# Patient Record
Sex: Female | Born: 1945 | Race: White | Hispanic: No | Marital: Married | Smoking: Former smoker
Health system: Southern US, Community
[De-identification: ages and names within clinical notes are randomized; demographics above are authoritative.]

## PROBLEM LIST (undated history)

## (undated) DIAGNOSIS — E119 Type 2 diabetes mellitus without complications: Secondary | ICD-10-CM

## (undated) DIAGNOSIS — K449 Diaphragmatic hernia without obstruction or gangrene: Secondary | ICD-10-CM

## (undated) DIAGNOSIS — F32A Depression, unspecified: Secondary | ICD-10-CM

## (undated) DIAGNOSIS — E785 Hyperlipidemia, unspecified: Secondary | ICD-10-CM

## (undated) DIAGNOSIS — H269 Unspecified cataract: Secondary | ICD-10-CM

## (undated) DIAGNOSIS — F419 Anxiety disorder, unspecified: Secondary | ICD-10-CM

## (undated) DIAGNOSIS — K648 Other hemorrhoids: Secondary | ICD-10-CM

## (undated) DIAGNOSIS — T7840XA Allergy, unspecified, initial encounter: Secondary | ICD-10-CM

## (undated) DIAGNOSIS — N189 Chronic kidney disease, unspecified: Secondary | ICD-10-CM

## (undated) DIAGNOSIS — R112 Nausea with vomiting, unspecified: Secondary | ICD-10-CM

## (undated) DIAGNOSIS — Z8489 Family history of other specified conditions: Secondary | ICD-10-CM

## (undated) DIAGNOSIS — I10 Essential (primary) hypertension: Secondary | ICD-10-CM

## (undated) DIAGNOSIS — M8580 Other specified disorders of bone density and structure, unspecified site: Secondary | ICD-10-CM

## (undated) DIAGNOSIS — E559 Vitamin D deficiency, unspecified: Secondary | ICD-10-CM

## (undated) DIAGNOSIS — K219 Gastro-esophageal reflux disease without esophagitis: Secondary | ICD-10-CM

## (undated) DIAGNOSIS — Z9889 Other specified postprocedural states: Secondary | ICD-10-CM

## (undated) DIAGNOSIS — F329 Major depressive disorder, single episode, unspecified: Secondary | ICD-10-CM

## (undated) HISTORY — DX: Diaphragmatic hernia without obstruction or gangrene: K44.9

## (undated) HISTORY — DX: Unspecified cataract: H26.9

## (undated) HISTORY — DX: Hyperlipidemia, unspecified: E78.5

## (undated) HISTORY — DX: Type 2 diabetes mellitus without complications: E11.9

## (undated) HISTORY — DX: Chronic kidney disease, unspecified: N18.9

## (undated) HISTORY — DX: Major depressive disorder, single episode, unspecified: F32.9

## (undated) HISTORY — DX: Allergy, unspecified, initial encounter: T78.40XA

## (undated) HISTORY — DX: Other hemorrhoids: K64.8

## (undated) HISTORY — PX: LUMBAR DISC SURGERY: SHX700

## (undated) HISTORY — PX: OTHER SURGICAL HISTORY: SHX169

## (undated) HISTORY — DX: Gastro-esophageal reflux disease without esophagitis: K21.9

## (undated) HISTORY — PX: ABDOMINAL HYSTERECTOMY: SHX81

## (undated) HISTORY — DX: Depression, unspecified: F32.A

## (undated) HISTORY — DX: Other specified disorders of bone density and structure, unspecified site: M85.80

## (undated) HISTORY — DX: Vitamin D deficiency, unspecified: E55.9

---

## 1997-12-26 ENCOUNTER — Ambulatory Visit (HOSPITAL_COMMUNITY): Admission: RE | Admit: 1997-12-26 | Discharge: 1997-12-26 | Payer: Self-pay | Admitting: Family Medicine

## 1997-12-26 ENCOUNTER — Encounter: Payer: Self-pay | Admitting: Family Medicine

## 1998-09-12 ENCOUNTER — Other Ambulatory Visit: Admission: RE | Admit: 1998-09-12 | Discharge: 1998-09-12 | Payer: Self-pay | Admitting: Family Medicine

## 1999-09-03 ENCOUNTER — Other Ambulatory Visit: Admission: RE | Admit: 1999-09-03 | Discharge: 1999-09-03 | Payer: Self-pay

## 2000-01-15 ENCOUNTER — Other Ambulatory Visit: Admission: RE | Admit: 2000-01-15 | Discharge: 2000-01-15 | Payer: Self-pay | Admitting: Family Medicine

## 2000-02-07 ENCOUNTER — Encounter (INDEPENDENT_AMBULATORY_CARE_PROVIDER_SITE_OTHER): Payer: Self-pay

## 2000-02-07 ENCOUNTER — Other Ambulatory Visit: Admission: RE | Admit: 2000-02-07 | Discharge: 2000-02-07 | Payer: Self-pay | Admitting: Internal Medicine

## 2000-05-29 ENCOUNTER — Other Ambulatory Visit: Admission: RE | Admit: 2000-05-29 | Discharge: 2000-05-29 | Payer: Self-pay | Admitting: Obstetrics and Gynecology

## 2000-12-01 ENCOUNTER — Other Ambulatory Visit: Admission: RE | Admit: 2000-12-01 | Discharge: 2000-12-01 | Payer: Self-pay | Admitting: Obstetrics and Gynecology

## 2001-12-07 ENCOUNTER — Other Ambulatory Visit: Admission: RE | Admit: 2001-12-07 | Discharge: 2001-12-07 | Payer: Self-pay | Admitting: Obstetrics and Gynecology

## 2003-01-03 ENCOUNTER — Other Ambulatory Visit: Admission: RE | Admit: 2003-01-03 | Discharge: 2003-01-03 | Payer: Self-pay | Admitting: Family Medicine

## 2003-02-27 ENCOUNTER — Encounter: Admission: RE | Admit: 2003-02-27 | Discharge: 2003-02-27 | Payer: Self-pay | Admitting: Family Medicine

## 2004-06-06 ENCOUNTER — Ambulatory Visit: Payer: Self-pay | Admitting: Family Medicine

## 2004-06-26 ENCOUNTER — Ambulatory Visit: Payer: Self-pay | Admitting: Family Medicine

## 2004-08-07 ENCOUNTER — Ambulatory Visit: Payer: Self-pay | Admitting: Family Medicine

## 2004-09-25 ENCOUNTER — Ambulatory Visit: Payer: Self-pay | Admitting: Family Medicine

## 2004-11-05 ENCOUNTER — Ambulatory Visit: Payer: Self-pay | Admitting: Family Medicine

## 2006-03-04 ENCOUNTER — Ambulatory Visit: Payer: Self-pay | Admitting: Family Medicine

## 2008-02-14 ENCOUNTER — Encounter
Admission: RE | Admit: 2008-02-14 | Discharge: 2008-03-30 | Payer: Self-pay | Admitting: Physical Medicine and Rehabilitation

## 2008-10-18 ENCOUNTER — Encounter: Admission: RE | Admit: 2008-10-18 | Discharge: 2008-10-18 | Payer: Self-pay | Admitting: Family Medicine

## 2008-11-30 ENCOUNTER — Ambulatory Visit (HOSPITAL_COMMUNITY): Admission: RE | Admit: 2008-11-30 | Discharge: 2008-12-01 | Payer: Self-pay | Admitting: Neurosurgery

## 2009-11-08 ENCOUNTER — Encounter (INDEPENDENT_AMBULATORY_CARE_PROVIDER_SITE_OTHER): Payer: Self-pay | Admitting: *Deleted

## 2009-11-30 ENCOUNTER — Encounter (INDEPENDENT_AMBULATORY_CARE_PROVIDER_SITE_OTHER): Payer: Self-pay | Admitting: *Deleted

## 2009-12-07 ENCOUNTER — Ambulatory Visit: Payer: Self-pay | Admitting: Internal Medicine

## 2009-12-07 ENCOUNTER — Encounter (INDEPENDENT_AMBULATORY_CARE_PROVIDER_SITE_OTHER): Payer: Self-pay | Admitting: *Deleted

## 2010-01-09 ENCOUNTER — Encounter: Admission: RE | Admit: 2010-01-09 | Discharge: 2010-01-09 | Payer: Self-pay | Admitting: Specialist

## 2010-01-24 ENCOUNTER — Ambulatory Visit: Payer: Self-pay | Admitting: Internal Medicine

## 2010-04-30 NOTE — Letter (Signed)
Summary: [facility] Instructions  [facility]  [address] [place], [place] [address]   Phone: [phone]  Fax: [phone]       SAIA DEROSSETT    1945/08/30    MRN: [id number]        Procedure Day /Date:  Thursday 01/24/2010     Arrival Time: 8:30 am      Procedure Time: 9:30 am     Location of Procedure:                    _x _  [facility] (4th Floor)                        PREPARATION FOR COLONOSCOPY WITH MOVIPREP   Starting 5 days prior to your procedure Saturday 10/22 do not eat nuts, seeds, popcorn, corn, beans, peas,  salads, or any raw vegetables.  Do not take any fiber supplements (e.g. Metamucil, Citrucel, and Benefiber).  THE DAY BEFORE YOUR PROCEDURE         DATE: Wednesday 10/26  1.  Drink clear liquids the entire day-NO SOLID FOOD  2.  Do not drink anything colored red or purple.  Avoid juices with pulp.  No orange juice.  3.  Drink at least 64 oz. (8 glasses) of fluid/clear liquids during the day to prevent dehydration and help the prep work efficiently.  CLEAR LIQUIDS INCLUDE: Water Jello Ice Popsicles Tea (sugar ok, no milk/cream) Powdered fruit flavored drinks Coffee (sugar ok, no milk/cream) Gatorade Juice: apple, white grape, white cranberry  Lemonade Clear bullion, consomm, broth Carbonated beverages (any kind) Strained chicken noodle soup Hard Candy                             4.  In the morning, mix first dose of MoviPrep solution:    Empty 1 Pouch A and 1 Pouch B into the disposable container    Add lukewarm drinking water to the top line of the container. Mix to dissolve    Refrigerate (mixed solution should be used within 24 hrs)  5.  Begin drinking the prep at 5:00 p.m. The MoviPrep container is divided by 4 marks.   Every 15 minutes drink the solution down to the next mark (approximately 8 oz) until the full liter is complete.   6.  Follow completed prep with 16 oz of clear liquid of your choice  (Nothing red or purple).  Continue to drink clear liquids until bedtime.  7.  Before going to bed, mix second dose of MoviPrep solution:    Empty 1 Pouch A and 1 Pouch B into the disposable container    Add lukewarm drinking water to the top line of the container. Mix to dissolve    Refrigerate  THE DAY OF YOUR PROCEDURE      DATE: Thursday 10/27  Beginning at 4:30 a.m. (5 hours before procedure):         1. Every 15 minutes, drink the solution down to the next mark (approx 8 oz) until the full liter is complete.  2. Follow completed prep with 16 oz. of clear liquid of your choice.    3. You may drink clear liquids until 7:30  am (2 HOURS BEFORE PROCEDURE).   MEDICATION INSTRUCTIONS  Unless otherwise instructed, you should take regular prescription medications with a small sip of water   as early as possible the morning  of your procedure.  Diabetic patients - see separate instructions.   Additional medication instructions: Do not take HCTZ day of procedure.         OTHER INSTRUCTIONS  You will need a responsible adult at least 64 years of age to accompany you and drive you home.   This person must remain in the waiting room during your procedure.  Wear loose fitting clothing that is easily removed.  Leave jewelry and other valuables at home.  However, you may wish to bring a book to read or  an iPod/MP3 player to listen to music as you wait for your procedure to start.  Remove all body piercing jewelry and leave at home.  Total time from sign-in until discharge is approximately 2-3 hours.  You should go home directly after your procedure and rest.  You can resume normal activities the  day after your procedure.  The day of your procedure you should not:   Drive   Make legal decisions   Operate machinery   Drink alcohol   Return to work  You will receive specific instructions about eating, activities and medications before you leave.    The above  instructions have been reviewed and explained to me by   Ezra Sites RN  December 07, 2009 2:33 PM     I fully understand and can verbalize these instructions _____________________________ Date _________

## 2010-04-30 NOTE — Miscellaneous (Signed)
Summary: [facility]  Clinical Lists Changes  Medications: Added new medication of MOVIPREP 100 GM  SOLR (PEG-KCL-NACL-NASULF-NA ASC-C) As per prep instructions. - Signed Rx of MOVIPREP 100 GM  SOLR (PEG-KCL-NACL-NASULF-NA ASC-C) As per prep instructions.;  #1 x 0;  Signed;  Entered by: Ezra Sites RN;  Authorized by: Hilarie Fredrickson MD;  Method used: Electronically to [facility] [address] [place], [place], [place]  [address], Ph: [phone], Fax: [phone] Allergies: Added new allergy or adverse reaction of PCN Added new allergy or adverse reaction of CODEINE Added new allergy or adverse reaction of * OXYMORPHONE Observations: Added new observation of NKA: F (12/07/2009 14:00)    Prescriptions: MOVIPREP 100 GM  SOLR (PEG-KCL-NACL-NASULF-NA ASC-C) As per prep instructions.  #1 x 0   Entered by:   Ezra Sites RN   Authorized by:   Hilarie Fredrickson MD   Signed by:   Ezra Sites RN on 12/07/2009   Method used:   Electronically to        [facility] (retail)       [address]       [place], [place]  [address]       Ph: [phone]       Fax: [phone]   RxID:   [id number]

## 2010-04-30 NOTE — Letter (Signed)
Summary: Diabetic Instructions  [facility]  [address] [place], [place] [address]   Phone: [phone]  Fax: [phone]    MAXIMA SKELTON [birth year] MRN: [id number]     ________________________________________________________________________  _  _   INSULIN (LONG ACTING) MEDICATION INSTRUCTIONS (Lantus, NPH, 70/30, Humulin, Novolin-N)   The day before your procedure:   Take  your regular evening dose    The day of your procedure:   Do not take your morning dose

## 2010-04-30 NOTE — Letter (Signed)
Summary: Previsit letter  [facility]  [address] [place], [place] [address]   Phone: [phone]  Fax: [phone]       11/08/2009 MRN: [id number]  [facility] [address] [place], [place]  [address]  Dear Ms. Elsasser,  Welcome to the Gastroenterology Division at [facility].    You are scheduled to see a nurse for your pre-procedure visit on 12-04-09 at 4:00p.m. on the 3rd floor at [facility], [address]  We ask that you try to arrive at our office 15 minutes prior to your appointment time to allow for check-in.  Your nurse visit will consist of discussing your medical and surgical history, your immediate family medical history, and your medications.    Please bring a complete list of all your medications or, if you prefer, bring the medication bottles and we will list them.  We will need to be aware of both prescribed and over the counter drugs.  We will need to know exact dosage information as well.  If you are on blood thinners (Coumadin, Plavix, Aggrenox, Ticlid, etc.) please call our office today/prior to your appointment, as we need to consult with your physician about holding your medication.   Please be prepared to read and sign documents such as consent forms, a financial agreement, and acknowledgement forms.  If necessary, and with your consent, a friend or relative is welcome to sit-in on the nurse visit with you.  Please bring your insurance card so that we may make a copy of it.  If your insurance requires a referral to see a specialist, please bring your referral form from your primary care physician.  No co-pay is required for this nurse visit.     If you cannot keep your appointment, please call [phone] to cancel or reschedule prior to your appointment date.  This allows [place] the opportunity to schedule an appointment for another patient in need of care.    Thank you for choosing [facility] for your medical needs.   We appreciate the opportunity to care for you.  Please visit [place] at our website  to learn more about our practice.                     Sincerely.                                                                                                                   The Gastroenterology Division

## 2010-04-30 NOTE — Procedures (Signed)
Summary: Colonoscopy  Patient: [name] Note: All result statuses are Final unless otherwise noted.  Tests: (1) Colonoscopy (COL)   COL Colonoscopy           DONE     [facility]     [address]     [place], [place]  [address]           COLONOSCOPY PROCEDURE REPORT           PATIENT:  Sonya, Cook  MR#:  [id number]     BIRTHDATE:  03-24-1946, 64 yrs. old  GENDER:  female     ENDOSCOPIST:  Wilhemina Bonito. Eda Keys, MD     REF. BY:  Paulita Cradle, N.P.     PROCEDURE DATE:  01/24/2010     PROCEDURE:  Average-risk screening colonoscopy     G0121     ASA CLASS:  Class II     INDICATIONS:  Routine Risk Screening     MEDICATIONS:   Fentanyl 75 mcg IV, Versed 9 mg IV           DESCRIPTION OF PROCEDURE:   After the risks benefits and     alternatives of the procedure were thoroughly explained, informed     consent was obtained.  Digital rectal exam was performed and     revealed no abnormalities.   The LB CF-H180AL [id number] endoscope     was introduced through the anus and advanced to the cecum, which     was identified by both the appendix and ileocecal valve, without     limitations.Time to cecum = 3:42 min. The quality of the prep was     excellent, using MoviPrep.  The instrument was then slowly     withdrawn (time = 11:47 min.) as the colon was fully examined.     <<PROCEDUREIMAGES>>           FINDINGS:  A normal appearing cecum, ileocecal valve, and     appendiceal orifice were identified. The ascending, hepatic     flexure, transverse, splenic flexure, descending, sigmoid colon,     and rectum appeared unremarkable.  No polyps or cancers were seen.     Retroflexed views in the rectum revealed small internal     hemorrhoids.    The scope was then withdrawn from the patient and     the procedure completed.           COMPLICATIONS:  None     ENDOSCOPIC IMPRESSION:     1) Normal colon     2) No polyps or cancers     3) Small Internal hemorrhoids  RECOMMENDATIONS:     1) Continue current colorectal screening recommendations for     "routine risk" patients with a repeat colonoscopy in 10 years.           ______________________________     Wilhemina Bonito. Eda Keys, MD           CC:  Paulita Cradle, NP;  The Patient           n.     eSIGNED:   John N. Eda Keys at 01/24/2010 10:32 AM           Arnaldo Natal, [id number]  Note: An exclamation mark (!) indicates a result that was not dispersed into the flowsheet. Document Creation Date: 01/24/2010 10:32 AM _______________________________________________________________________  (1) Order result status: Final Collection or observation date-time: 01/24/2010 10:26 Requested date-time:  Receipt date-time:  Reported date-time:  Referring Physician:   Ordering Physician: Fransico Setters [phone]) Specimen Source:  Source: Launa Grill Order Number: [id number] Lab site:   Appended Document: Colonoscopy    Clinical Lists Changes  Observations: Added new observation of COLONNXTDUE: 12/2019 (01/24/2010 13:31)

## 2010-06-12 LAB — GLUCOSE, CAPILLARY
Glucose-Capillary: 141 mg/dL — ABNORMAL HIGH (ref 70–99)
Glucose-Capillary: 69 mg/dL — ABNORMAL LOW (ref 70–99)
Glucose-Capillary: 76 mg/dL (ref 70–99)

## 2010-07-05 LAB — BASIC METABOLIC PANEL
CO2: 26 mEq/L (ref 19–32)
Calcium: 9.9 mg/dL (ref 8.4–10.5)
Glucose, Bld: 150 mg/dL — ABNORMAL HIGH (ref 70–99)
Potassium: 3.6 mEq/L (ref 3.5–5.1)
Sodium: 137 mEq/L (ref 135–145)

## 2010-07-05 LAB — GLUCOSE, CAPILLARY
Glucose-Capillary: 140 mg/dL — ABNORMAL HIGH (ref 70–99)
Glucose-Capillary: 202 mg/dL — ABNORMAL HIGH (ref 70–99)
Glucose-Capillary: 227 mg/dL — ABNORMAL HIGH (ref 70–99)

## 2010-07-06 LAB — BASIC METABOLIC PANEL WITH GFR
BUN: 36 mg/dL — ABNORMAL HIGH (ref 6–23)
CO2: 27 meq/L (ref 19–32)
Calcium: 10 mg/dL (ref 8.4–10.5)
Chloride: 101 meq/L (ref 96–112)
Creatinine, Ser: 1.92 mg/dL — ABNORMAL HIGH (ref 0.4–1.2)
GFR calc non Af Amer: 26 mL/min — ABNORMAL LOW
Glucose, Bld: 129 mg/dL — ABNORMAL HIGH (ref 70–99)
Potassium: 3.9 meq/L (ref 3.5–5.1)
Sodium: 139 meq/L (ref 135–145)

## 2010-07-06 LAB — CBC
HCT: 34.4 % — ABNORMAL LOW (ref 36.0–46.0)
Hemoglobin: 12 g/dL (ref 12.0–15.0)
MCHC: 34.8 g/dL (ref 30.0–36.0)
MCV: 89.9 fL (ref 78.0–100.0)
Platelets: 247 10*3/uL (ref 150–400)
RBC: 3.83 MIL/uL — ABNORMAL LOW (ref 3.87–5.11)
RDW: 12.9 % (ref 11.5–15.5)
WBC: 8.4 10*3/uL (ref 4.0–10.5)

## 2010-08-13 NOTE — Op Note (Signed)
NAMEANIELLA, WANDREY               ACCOUNT NO.:  [id number]   MEDICAL RECORD NO.:  [id number]          PATIENT TYPE:  OIB   LOCATION:  3526                         FACILITY:  [facility]   PHYSICIAN:  Danae Orleans. Venetia Maxon, M.D.  DATE OF BIRTH:  29-Apr-1945   DATE OF PROCEDURE:  11/30/2008  DATE OF DISCHARGE:                               OPERATIVE REPORT   PREOPERATIVE DIAGNOSES:  Lumbar spondylolisthesis with stenosis,  spondylosis, synovial cyst, and lumbar radiculopathy.   POSTOPERATIVE DIAGNOSES:  Lumbar spondylolisthesis with stenosis,  spondylosis, synovial cyst, and lumbar radiculopathy.   PROCEDURES:  1. Resection of left L4-5 synovial cyst.  2. Decompression of spinal stenosis at L4-5 and L5-S1 levels with Vax      spinal decompression with nerve monitoring, decompression performed      at L4-5 and L5-S1 on the left.   SURGEON:  Danae Orleans. Venetia Maxon, MD   ASSISTANT:  1. Georgiann Cocker, RN  2. Cristi Loron, MD   ANESTHESIA:  General endotracheal anesthesia.   ESTIMATED BLOOD LOSS:  Minimal.   COMPLICATIONS:  None.   DISPOSITION:  Recovery.   INDICATIONS:  Jamey Demchak is a 65 year old woman with left leg pain  and spondylosis, stenosis, spondylolisthesis of L4-L5 with the facet  cyst at L4-5 on the left and foraminal stenosis at L4-5 and L5-S1 levels  on the left.  It was elected to take her to Surgery for decompression at  these levels.  Because of her preexisting spondylolisthesis, it was  elected to use the Vax spinal decompression system.   PROCEDURE:  Ms. Paulding was brought to the operating room.  Following  satisfactory and uncomplicated induction of general endotracheal  anesthesia and placement of intravenous lines, establishment of neural  monitoring in the lower extremity myotomes, the patient was then placed  in the prone position on the Wilson frame.  Her low back was prepped and  draped in the usual sterile fashion.  Area of planned incision was  infiltrated  with local lidocaine.  An incision was made in the midline  and carried to the L4-S1 spinous processes and a sharp incision was made  on the left side of midline of the lumbodorsal fascia.  Subperiosteal  dissection was performed exposing the L4-5 and L5-S1 levels.  Self-  retaining retractor was placed.  Intraoperative x-ray with fluoroscopy  confirmed marker probes at the L4-5 and L5-S1 interspaces.  A hemi-semi-  laminectomy of L5 was performed with high-speed drill and completed  Kerrison rongeurs and medial facetectomy was performed at this level  with Kerrison spinal canal and neural elements were decompressed.  At  the L4-5 level, there was significant facet arthropathy and synovial  cyst of the facet joint.  This was decompressed and the laminotomy of L4  was performed.  Hypertrophied ligamentous tissue and synovial cyst was  then removed with resultant decompression of the thecal sac and L5 nerve  root.  The Vax spinal decompression system was then utilized initially  at L4-5.  The ipsilateral introducer was placed.  The wire was then  passed without difficulty.  Its position was confirmed  on lateral  fluoroscopy.  Subsequently, the neuro check was utilized and 2x  stimulation was identified top compared bottom and subsequently a 10-mm  shaver was then passed without difficulty, 15 reciprocations were  performed with significant decompression of the foramen.  The wire was  removed.  Hemostasis was assured with Gelfoam soak and thrombin.  Attention was then turned to the L5-S1 level where the introducer was  placed.  The wire was passed without difficulty.  Neuro check was then  performed and again 2 stimulation was identified, top compared to  bottom.  The 10-mm shaver was then passed without difficulty and 15  reciprocations were performed with a significant decompression of the  foramen.  The decompression device was removed.  Hemostasis was assured.  The wound was irrigated and  bathed in Depo-Medrol and fentanyl.  The  self-retaining retractor was removed.  The lumbodorsal fascia was closed  with 0 Vicryl sutures.  The subcutaneous tissues were reapproximated  with 2-0 Vicryl interrupted inverted sutures and skin edges were  reapproximated with interrupted 3-0 Vicryl subcuticular stitch.  The  wound was dressed with Dermabond.  The patient was extubated in the  operating room and taken to the recovery room in stable satisfactory  condition having tolerating the operation well.  Counts were correct at  the end of the case.      Danae Orleans. Venetia Maxon, M.D.  Electronically Signed     JDS/MEDQ  D:  11/30/2008  T:  12/01/2008  Job:  [id number]

## 2012-06-21 ENCOUNTER — Other Ambulatory Visit: Payer: Self-pay

## 2012-06-21 MED ORDER — BUSPIRONE HCL 15 MG PO TABS: ORAL_TABLET | ORAL | Status: DC

## 2012-07-07 ENCOUNTER — Other Ambulatory Visit: Payer: Self-pay | Admitting: *Deleted

## 2012-07-07 MED ORDER — ESTROGENS, CONJUGATED 0.625 MG/GM VA CREA: TOPICAL_CREAM | Freq: Every day | VAGINAL | Status: DC

## 2012-07-07 NOTE — Telephone Encounter (Signed)
Pt seen on 06-02-12 and you gave an additional cream. Wanted to make sure you want pt to continue to use this as well. Thank you

## 2012-07-15 ENCOUNTER — Telehealth: Payer: Self-pay | Admitting: Nurse Practitioner

## 2012-07-15 NOTE — Telephone Encounter (Signed)
Please advise 

## 2012-07-15 NOTE — Telephone Encounter (Signed)
PT AWARE  

## 2012-07-15 NOTE — Telephone Encounter (Signed)
NTBS first d/t new computer system. We will order when seen.

## 2012-07-30 ENCOUNTER — Other Ambulatory Visit: Payer: Self-pay | Admitting: *Deleted

## 2012-07-30 MED ORDER — SIMVASTATIN 40 MG PO TABS: 40.0000 mg | ORAL_TABLET | Freq: Every day | ORAL | Status: DC

## 2012-07-30 MED ORDER — CITALOPRAM HYDROBROMIDE 40 MG PO TABS: 40.0000 mg | ORAL_TABLET | Freq: Every day | ORAL | Status: DC

## 2012-07-30 MED ORDER — BUSPIRONE HCL 15 MG PO TABS: ORAL_TABLET | ORAL | Status: DC

## 2012-08-06 ENCOUNTER — Other Ambulatory Visit: Payer: Self-pay

## 2012-08-06 MED ORDER — BUSPIRONE HCL 15 MG PO TABS: ORAL_TABLET | ORAL | Status: DC

## 2012-08-30 ENCOUNTER — Ambulatory Visit (INDEPENDENT_AMBULATORY_CARE_PROVIDER_SITE_OTHER): Payer: Medicare Other | Admitting: Nurse Practitioner

## 2012-08-30 ENCOUNTER — Encounter: Payer: Self-pay | Admitting: Nurse Practitioner

## 2012-08-30 VITALS — BP 105/49 | HR 59 | Temp 97.9°F | Ht 61.5 in | Wt 165.5 lb

## 2012-08-30 DIAGNOSIS — M8580 Other specified disorders of bone density and structure, unspecified site: Secondary | ICD-10-CM | POA: Insufficient documentation

## 2012-08-30 DIAGNOSIS — N289 Disorder of kidney and ureter, unspecified: Secondary | ICD-10-CM

## 2012-08-30 DIAGNOSIS — F329 Major depressive disorder, single episode, unspecified: Secondary | ICD-10-CM

## 2012-08-30 DIAGNOSIS — E1169 Type 2 diabetes mellitus with other specified complication: Secondary | ICD-10-CM | POA: Insufficient documentation

## 2012-08-30 DIAGNOSIS — E785 Hyperlipidemia, unspecified: Secondary | ICD-10-CM

## 2012-08-30 DIAGNOSIS — M949 Disorder of cartilage, unspecified: Secondary | ICD-10-CM

## 2012-08-30 DIAGNOSIS — F32A Depression, unspecified: Secondary | ICD-10-CM

## 2012-08-30 DIAGNOSIS — E119 Type 2 diabetes mellitus without complications: Secondary | ICD-10-CM

## 2012-08-30 DIAGNOSIS — M899 Disorder of bone, unspecified: Secondary | ICD-10-CM

## 2012-08-30 LAB — COMPLETE METABOLIC PANEL WITH GFR
Albumin: 4.7 g/dL (ref 3.5–5.2)
Alkaline Phosphatase: 55 U/L (ref 39–117)
BUN: 48 mg/dL — ABNORMAL HIGH (ref 6–23)
GFR, Est Non African American: 33 mL/min — ABNORMAL LOW
Glucose, Bld: 116 mg/dL — ABNORMAL HIGH (ref 70–99)
Potassium: 5 mEq/L (ref 3.5–5.3)
Total Bilirubin: 0.4 mg/dL (ref 0.3–1.2)

## 2012-08-30 NOTE — Patient Instructions (Addendum)
Diets for Diabetes, Food Labeling Look at food labels to help you decide how much of a product you can eat. You will want to check the amount of total carbohydrate in a serving to see how the food fits into your meal plan. In the list of ingredients, the ingredient present in the largest amount by weight must be listed first, followed by the other ingredients in descending order. STANDARD OF IDENTITY Most products have a list of ingredients. However, foods that the Food and Drug Administration (FDA) has given a standard of identity do not need a list of ingredients. A standard of identity means that a food must contain certain ingredients if it is called a particular name. Examples are mayonnaise, peanut butter, ketchup, jelly, and cheese. LABELING TERMS There are many terms found on food labels. Some of these terms have specific definitions. Some terms are regulated by the FDA, and the FDA has clearly specified how they can be used. Others are not regulated or well-defined and can be misleading and confusing. SPECIFICALLY DEFINED TERMS Nutritive Sweetener.  A sweetener that contains calories,such as table sugar or honey. Nonnutritive Sweetener.  A sweetener with few or no calories,such as saccharin, aspartame, sucralose, and cyclamate. LABELING TERMS REGULATED BY THE FDA Free.  The product contains only a tiny or small amount of fat, cholesterol, sodium, sugar, or calories. For example, a "fat-free" product will contain less than 0.5 g of fat per serving. Low.  A food described as "low" in fat, saturated fat, cholesterol, sodium, or calories could be eaten fairly often without exceeding dietary guidelines. For example, "low in fat" means no more than 3 g of fat per serving. Lean.  "Lean" and "extra lean" are U.S. Department of Agriculture (USDA) terms for use on meat and poultry products. "Lean" means the product contains less than 10 g of fat, 4 g of saturated fat, and 95 mg of cholesterol  per serving. "Lean" is not as low in fat as a product labeled "low." Extra Lean.  "Extra lean" means the product contains less than 5 g of fat, 2 g of saturated fat, and 95 mg of cholesterol per serving. While "extra lean" has less fat than "lean," it is still higher in fat than a product labeled "low." Reduced, Less, Fewer.  A diet product that contains 25% less of a nutrient or calories than the regular version. For example, hot dogs might be labeled "25% less fat than our regular hot dogs." Light/Lite.  A diet product that contains  fewer calories or  the fat of the original. For example, "light in sodium" means a product with  the usual sodium. More.  One serving contains at least 10% more of the daily value of a vitamin, mineral, or fiber than usual. Good Source Of.  One serving contains 10% to 19% of the daily value for a particular vitamin, mineral, or fiber. Excellent Source Of.  One serving contains 20% or more of the daily value for a particular nutrient. Other terms used might be "high in" or "rich in." Enriched or Fortified.  The product contains added vitamins, minerals, or protein. Nutrition labeling must be used on enriched or fortified foods. Imitation.  The product has been altered so that it is lower in protein, vitamins, or minerals than the usual food,such as imitation peanut butter. Total Fat.  The number listed is the total of all fat found in a serving of the product. Under total fat, food labels must list saturated fat and   trans fat, which are associated with raising bad cholesterol and an increased risk of heart blood vessel disease. Saturated Fat.  Mainly fats from animal-based sources. Some examples are red meat, cheese, cream, whole milk, and coconut oil. Trans Fat.  Found in some fried snack foods, packaged foods, and fried restaurant foods. It is recommended you eat as close to 0 g of trans fat as possible, since it raises bad cholesterol and lowers  good cholesterol. Polyunsaturated and Monounsaturated Fats.  More healthful fats. These fats are from plant sources. Total Carbohydrate.  The number of carbohydrate grams in a serving of the product. Under total carbohydrate are listed the other carbohydrate sources, such as dietary fiber and sugars. Dietary Fiber.  A carbohydrate from plant sources. Sugars.  Sugars listed on the label contain all naturally occurring sugars as well as added sugars. LABELING TERMS NOT REGULATED BY THE FDA Sugarless.  Table sugar (sucrose) has not been added. However, the manufacturer may use another form of sugar in place of sucrose to sweeten the product. For example, sugar alcohols are used to sweeten foods. Sugar alcohols are a form of sugar but are not table sugar. If a product contains sugar alcohols in place of sucrose, it can still be labeled "sugarless." Low Salt, Salt-Free, Unsalted, No Salt, No Salt Added, Without Added Salt.  Food that is usually processed with salt has been made without salt. However, the food may contain sodium-containing additives, such as preservatives, leavening agents, or flavorings. Natural.  This term has no legal meaning. Organic.  Foods that are certified as organic have been inspected and approved by the USDA to ensure they are produced without pesticides, fertilizers containing synthetic ingredients, bioengineering, or ionizing radiation. Document Released: 03/20/2003 Document Revised: 06/09/2011 Document Reviewed: 10/05/2008 [employer] Patient Information 2014 [employer].  

## 2012-08-30 NOTE — Progress Notes (Signed)
Subjective:    Patient ID: Sonya Cook, female    DOB: January 10, 1946, 67 y.o.   MRN: [id number]  Hyperlipidemia This is a chronic problem. The current episode started more than 1 year ago. The problem is controlled. Recent lipid tests were reviewed and are normal. Exacerbating diseases include diabetes. Pertinent negatives include no chest pain, focal weakness, leg pain, myalgias or shortness of breath. Current antihyperlipidemic treatment includes statins (lovaza). The current treatment provides moderate improvement of lipids. Compliance problems include adherence to diet and adherence to exercise.  Risk factors for coronary artery disease include diabetes mellitus and post-menopausal.  Diabetes She presents for her follow-up diabetic visit. She has type 2 diabetes mellitus. No MedicAlert identification noted. The initial diagnosis of diabetes was made 7 years ago. Her disease course has been stable. There are no hypoglycemic associated symptoms. Pertinent negatives for diabetes include no chest pain, no polydipsia, no polyphagia, no polyuria, no weakness and no weight loss. There are no hypoglycemic complications. Symptoms are stable. There are no diabetic complications. Risk factors for coronary artery disease include dyslipidemia and post-menopausal. Current diabetic treatment includes insulin injections (sliding scale based on carbs planning to eat at that meal.). She is compliant with treatment all of the time. Her weight is stable. She is following a diabetic diet. When asked about meal planning, she reported none. She has not had a previous visit with a dietician. She never participates in exercise. There is no change in her home blood glucose trend. Her breakfast blood glucose is taken between 8-9 am. Her breakfast blood glucose range is generally 110-130 mg/dl. Her lunch blood glucose is taken between 1-2 pm. Her lunch blood glucose range is generally 130-140 mg/dl. Her dinner blood glucose is  taken between 7-8 pm. Her dinner blood glucose range is generally 140-180 mg/dl. Her overall blood glucose range is 130-140 mg/dl. An ACE inhibitor/angiotensin II receptor blocker is being taken. She does not see a podiatrist.Eye exam is current (1 1/2 years ago- Patient encouraged to make appointment.).  GERD Omerpazole- works well- Keeps symptoms under control Chronic back pain Goes to pain management- On fentanyl patch and norco  Review of Systems  Constitutional: Negative.  Negative for weight loss.  Respiratory: Negative for cough, chest tightness and shortness of breath.   Cardiovascular: Negative for chest pain.  Endocrine: Negative for polydipsia, polyphagia and polyuria.  Musculoskeletal: Positive for back pain. Negative for myalgias.  Neurological: Negative for focal weakness and weakness.  Hematological: Negative.   Psychiatric/Behavioral: Negative.     Objective:   Physical Exam  Constitutional: She is oriented to person, place, and time. She appears well-developed and well-nourished.  HENT:  Nose: Nose normal.  Mouth/Throat: Oropharynx is clear and moist.  Eyes: EOM are normal.  Neck: Trachea normal, normal range of motion and full passive range of motion without pain. Neck supple. No JVD present. Carotid bruit is not present. No thyromegaly present.  Cardiovascular: Normal rate, regular rhythm, normal heart sounds and intact distal pulses.  Exam reveals no gallop and no friction rub.   No murmur heard. Pulmonary/Chest: Effort normal and breath sounds normal.  Abdominal: Soft. Bowel sounds are normal. She exhibits no distension and no mass. There is no tenderness.  Musculoskeletal: Normal range of motion.  Lymphadenopathy:    She has no cervical adenopathy.  Neurological: She is alert and oriented to person, place, and time. She has normal reflexes.  Skin: Skin is warm and dry.  Psychiatric: She has a normal mood and  affect. Her behavior is normal. Judgment and thought  content normal.   BP 105/49  Pulse 59  Temp(Src) 97.9 F (36.6 C) (Oral)  Ht 5' 1.5" (1.562 m)  Wt 165 lb 8 oz (75.07 kg)  BMI 30.77 kg/m2   Results for orders placed in visit on 08/30/12  POCT GLYCOSYLATED HEMOGLOBIN (HGB A1C)      Result Value Range   Hemoglobin A1C 5.9 %          Assessment & Plan:   1. Diabetes   2. Other and unspecified hyperlipidemia   3. Depression   4. Osteopenia   5. Hyperlipidemia   6. Renal insufficiency    Orders Placed This Encounter  Procedures  . DG Bone Density    Standing Status: Future     Number of Occurrences:      Standing Expiration Date: 10/30/2013    Order Specific Question:  Reason for Exam (SYMPTOM  OR DIAGNOSIS REQUIRED)    Answer:  screening    Order Specific Question:  Preferred imaging location?    Answer:  Internal  . COMPLETE METABOLIC PANEL WITH GFR  . NMR Lipoprofile with Lipids  . POCT glycosylated hemoglobin (Hb A1C)     Medication List       These changes are accurate as of: 08/30/2012 12:59 PM. If you have any questions, ask your nurse or doctor.          STOP taking these medications       clotrimazole-betamethasone cream  Commonly known as:  LOTRISONE  Stopped by:  Bennie Pierini, FNP     ESTRACE VAGINAL 0.1 MG/GM vaginal cream  Generic drug:  estradiol  Stopped by:  Bennie Pierini, FNP     meloxicam 15 MG tablet  Commonly known as:  MOBIC  Stopped by:  Bennie Pierini, FNP      TAKE these medications       Black Cohosh 160 MG Caps  Take by mouth.     busPIRone 15 MG tablet  Commonly known as:  BUSPAR  1/2 TO 1 Tablet every 8 hours as needed     citalopram 40 MG tablet  Commonly known as:  CELEXA  Take 1 tablet (40 mg total) by mouth daily.     conjugated estrogens vaginal cream  Commonly known as:  PREMARIN  Place vaginally daily.     Cranberry 1000 MG Caps  Take by mouth.     diazepam 10 MG tablet  Commonly known as:  VALIUM     fentaNYL 50 MCG/HR  Commonly  known as:  DURAGESIC - dosed mcg/hr     HEALTHY LIVER PO  Take by mouth.     hydrochlorothiazide 25 MG tablet  Commonly known as:  HYDRODIURIL  Take 25 mg by mouth daily.     HYDROcodone-acetaminophen 10-325 MG per tablet  Commonly known as:  NORCO  Take 1 tablet by mouth 2 (two) times daily.     lisinopril 10 MG tablet  Commonly known as:  PRINIVIL,ZESTRIL  Take 10 mg by mouth daily.     multivitamin tablet  Take 1 tablet by mouth daily.     NOVOLOG FLEXPEN 100 UNIT/ML injection  Generic drug:  insulin aspart     omega-3 acid ethyl esters 1 G capsule  Commonly known as:  LOVAZA     omeprazole 20 MG capsule  Commonly known as:  PRILOSEC  Take 20 mg by mouth daily.     simvastatin 40 MG tablet  Commonly  known as:  ZOCOR  Take 1 tablet (40 mg total) by mouth at bedtime.     ZORVOLEX 35 MG Caps  Generic drug:  Diclofenac  Take 35 mg by mouth 3 (three) times daily.       Continue sliding scale novalog-  Low carb diet and exercise encouraged Labs pending Mary-Margaret Daphine Deutscher, FNP

## 2012-08-31 ENCOUNTER — Other Ambulatory Visit: Payer: Self-pay | Admitting: Nurse Practitioner

## 2012-08-31 LAB — NMR LIPOPROFILE WITH LIPIDS
HDL Size: 8.7 nm — ABNORMAL LOW (ref >=9.2)
LDL Particle Number: <300 nmol/L (ref <1000)
Large VLDL-P: <0.8 nmol/L (ref <=2.7)
Triglycerides: 330 mg/dL — ABNORMAL HIGH (ref <150)
VLDL Size: 35.9 nm (ref <=46.6)

## 2012-08-31 MED ORDER — FENOFIBRATE 145 MG PO TABS: 145.0000 mg | ORAL_TABLET | Freq: Every day | ORAL | Status: DC

## 2012-09-03 ENCOUNTER — Other Ambulatory Visit: Payer: Self-pay | Admitting: *Deleted

## 2012-09-03 MED ORDER — HYDROCHLOROTHIAZIDE 25 MG PO TABS: 25.0000 mg | ORAL_TABLET | Freq: Every day | ORAL | Status: DC

## 2012-09-03 MED ORDER — CITALOPRAM HYDROBROMIDE 40 MG PO TABS: 40.0000 mg | ORAL_TABLET | Freq: Every day | ORAL | Status: DC

## 2012-09-03 MED ORDER — OMEGA-3-ACID ETHYL ESTERS 1 G PO CAPS: ORAL_CAPSULE | ORAL | Status: DC

## 2012-09-03 MED ORDER — LISINOPRIL 10 MG PO TABS: 10.0000 mg | ORAL_TABLET | Freq: Every day | ORAL | Status: DC

## 2012-09-03 MED ORDER — SIMVASTATIN 40 MG PO TABS: 40.0000 mg | ORAL_TABLET | Freq: Every day | ORAL | Status: DC

## 2012-09-06 ENCOUNTER — Other Ambulatory Visit (HOSPITAL_COMMUNITY): Payer: Self-pay | Admitting: Physical Medicine and Rehabilitation

## 2012-09-06 DIAGNOSIS — M549 Dorsalgia, unspecified: Secondary | ICD-10-CM

## 2012-09-08 ENCOUNTER — Ambulatory Visit (HOSPITAL_COMMUNITY)
Admission: RE | Admit: 2012-09-08 | Discharge: 2012-09-08 | Disposition: A | Discharge status: Home / Self Care | Payer: Medicare Other | Source: Ambulatory Visit | Attending: Physical Medicine and Rehabilitation | Admitting: Physical Medicine and Rehabilitation

## 2012-09-08 DIAGNOSIS — M545 Low back pain, unspecified: Secondary | ICD-10-CM | POA: Insufficient documentation

## 2012-09-08 DIAGNOSIS — M5137 Other intervertebral disc degeneration, lumbosacral region: Secondary | ICD-10-CM | POA: Insufficient documentation

## 2012-09-08 DIAGNOSIS — M51379 Other intervertebral disc degeneration, lumbosacral region without mention of lumbar back pain or lower extremity pain: Secondary | ICD-10-CM | POA: Insufficient documentation

## 2012-09-08 DIAGNOSIS — M48061 Spinal stenosis, lumbar region without neurogenic claudication: Secondary | ICD-10-CM | POA: Insufficient documentation

## 2012-09-08 DIAGNOSIS — M549 Dorsalgia, unspecified: Secondary | ICD-10-CM

## 2012-09-27 ENCOUNTER — Telehealth: Payer: Self-pay | Admitting: Nurse Practitioner

## 2012-09-27 ENCOUNTER — Other Ambulatory Visit: Payer: Self-pay

## 2012-09-27 MED ORDER — BUSPIRONE HCL 15 MG PO TABS: ORAL_TABLET | ORAL | Status: DC

## 2012-09-28 NOTE — Telephone Encounter (Signed)
dup

## 2012-09-30 ENCOUNTER — Encounter: Payer: Self-pay | Admitting: General Practice

## 2012-09-30 ENCOUNTER — Ambulatory Visit (INDEPENDENT_AMBULATORY_CARE_PROVIDER_SITE_OTHER): Payer: Medicare Other | Admitting: General Practice

## 2012-09-30 VITALS — BP 121/69 | HR 69 | Temp 97.3°F | Ht 61.5 in | Wt 161.0 lb

## 2012-09-30 DIAGNOSIS — R5381 Other malaise: Secondary | ICD-10-CM

## 2012-09-30 DIAGNOSIS — R5383 Other fatigue: Secondary | ICD-10-CM

## 2012-09-30 MED ORDER — HYDROCHLOROTHIAZIDE 25 MG PO TABS: 12.5000 mg | ORAL_TABLET | Freq: Every day | ORAL | Status: DC

## 2012-09-30 NOTE — Patient Instructions (Signed)

## 2012-09-30 NOTE — Progress Notes (Signed)
  Subjective:    Patient ID: Sonya Cook, female    DOB: [birth year], 67 y.o.   MRN: [id number]  HPI Presents today with complaints of blood pressure being lower on some mornings. She denies checking blood pressure on a regular basis, only when she doesn't feel well. She reports taking blood pressure medications as directed. She reports taking lisinopril and HCTZ once daily. She reports starting a new antiinflammatory medications. She also reports feeling more tired than normal.     Review of Systems  Constitutional: Negative for fever and chills.  Respiratory: Negative for cough, chest tightness and shortness of breath.   Cardiovascular: Negative for chest pain and palpitations.  Gastrointestinal: Negative for vomiting, abdominal pain, diarrhea and blood in stool.  Genitourinary: Negative for difficulty urinating.  Neurological: Negative for dizziness, weakness and headaches.       Objective:   Physical Exam  Constitutional: She is oriented to person, place, and time. She appears well-developed and well-nourished.  HENT:  Head: Normocephalic and atraumatic.  Cardiovascular: Normal rate, regular rhythm and normal heart sounds.   Pulmonary/Chest: Effort normal and breath sounds normal. No respiratory distress. She exhibits no tenderness.  Abdominal: Soft. Bowel sounds are normal. She exhibits no distension. There is no tenderness.  Neurological: She is alert and oriented to person, place, and time.  Skin: Skin is warm and dry.  Psychiatric: She has a normal mood and affect.          Assessment & Plan:  1. Malaise and fatigue - Vitamin B12 - Thyroid Panel With TSH - Vitamin D 25 hydroxy -decrease HCTZ to 12.5mg  daily -keep diary of blood pressures and bring to next visit in two weeks -RTO if symptoms worsen or seek emergency medical attention -Patient verbalized understanding -Coralie Keens, FNP-C

## 2012-09-30 NOTE — Telephone Encounter (Signed)
Left mult messages 4 days ago- no response from pt- they may call if appt is further needed

## 2012-10-01 LAB — VITAMIN D 25 HYDROXY (VIT D DEFICIENCY, FRACTURES): Vit D, 25-Hydroxy: 43 ng/mL (ref 30–89)

## 2012-10-01 LAB — VITAMIN B12: Vitamin B-12: 1131 pg/mL — ABNORMAL HIGH (ref 211–911)

## 2012-10-01 LAB — THYROID PANEL WITH TSH
T4, Total: 6.5 ug/dL (ref 5.0–12.5)
TSH: 4.352 u[IU]/mL (ref 0.350–4.500)

## 2012-10-20 ENCOUNTER — Ambulatory Visit (INDEPENDENT_AMBULATORY_CARE_PROVIDER_SITE_OTHER): Payer: Medicare Other | Admitting: Pharmacist

## 2012-10-20 ENCOUNTER — Encounter: Payer: Self-pay | Admitting: Pharmacist

## 2012-10-20 ENCOUNTER — Ambulatory Visit (INDEPENDENT_AMBULATORY_CARE_PROVIDER_SITE_OTHER): Payer: Medicare Other

## 2012-10-20 VITALS — Ht 61.5 in | Wt 165.0 lb

## 2012-10-20 DIAGNOSIS — M899 Disorder of bone, unspecified: Secondary | ICD-10-CM

## 2012-10-20 DIAGNOSIS — M8580 Other specified disorders of bone density and structure, unspecified site: Secondary | ICD-10-CM

## 2012-10-20 NOTE — Patient Instructions (Addendum)

## 2012-10-20 NOTE — Progress Notes (Signed)
Patient ID: Sonya Cook, female   DOB: 02/13/1946, 67 y.o.   MRN: [id number]  [facility] Current Height: Height: 5' 1.5" (156.2 cm)      Max Lifetime Height:  5\' 2"  Current Weight: Weight: 165 lb (74.844 kg)       Ethnicity:Caucasian    HPI: Does pt already have a diagnosis of:  Osteopenia?  Yes Osteoporosis?  No  Back Pain?  Yes    - surgery 2010 (pain management specialist - Dr. Nickola Major)  Kyphosis?  No Prior fracture?  No Med(s) for Osteoporosis/Osteopenia:  none Med(s) previously tried for Osteoporosis/Osteopenia:  none                                                             PMH: Age at menopause:  60's Hysterectomy?  Yes Oophorectomy?  No HRT? Yes - Current.  Type/duration: vaginal premarin cream and black cohosh Steroid Use?  No Thyroid med?  No History of cancer?  No History of digestive disorders (ie Crohn's)?  No Current or previous eating disorders?  No Last Vitamin D Result:  43 (09/30/2012) Last GFR Result:  33 (09/30/2012)  **history of renal insfficiancy**   FH/SH: Family history of osteoporosis?  No Parent with history of hip fracture?  No Family history of breast cancer?  No Exercise?  No Smoking?  No Alcohol?  No    Calcium Assessment Calcium Intake  # of servings/day  Calcium mg  Milk (8 oz) 0  x  300  = 0  Yogurt (4 oz) 0.5 x  200 = 100mg   Cheese (1 oz) 0 x  200 = 0  Other Calcium sources   250mg   Ca supplement mvi = 400mg    Estimated calcium intake per day 750mg     DEXA Results Date of Test T-Score for AP Spine L1-L4 T-Score for Total Left Hip T-Score for Total Right Hip  10/20/2012 -0.3 -0.7 -0.4  12/26/2009 -0.5 -0.6 -0.4            ** Tscore for neck of left femur was -1.4 today and for neck of right femur was -1.4 today  FRAX 10 year estimate: Total FX risk:  8.6%  (consider medication if >/= 20%) Hip FX risk:  0.9%  (consider medication if >/= 3%)  Assessment: Osteopenia with stable  BMD  Recommendations: 1.  Reviewed DEXA and discussed fracture risk 2.  recommend calcium 1200mg  daily through supplementation or diet.  3.  recommend weight bearing exercise - 30 minutes at least 4 days  per week.   4.  Counseled and educated about fall risk and prevention.   Recheck DEXA:  2 years  Time spent counseling patient:  25 minutes

## 2012-10-29 ENCOUNTER — Telehealth: Payer: Self-pay | Admitting: Internal Medicine

## 2012-10-29 NOTE — Telephone Encounter (Signed)
Pt states she is having problems with reflux and is requesting to be seen. Pt scheduled to see Amy Esterwood 11/02/12@10am . Pt aware of appt.

## 2012-11-02 ENCOUNTER — Ambulatory Visit (INDEPENDENT_AMBULATORY_CARE_PROVIDER_SITE_OTHER): Payer: Medicare Other | Admitting: Physician Assistant

## 2012-11-02 ENCOUNTER — Encounter: Payer: Self-pay | Admitting: Physician Assistant

## 2012-11-02 VITALS — BP 100/60 | HR 66 | Ht 61.5 in | Wt 165.0 lb

## 2012-11-02 DIAGNOSIS — R1013 Epigastric pain: Secondary | ICD-10-CM

## 2012-11-02 DIAGNOSIS — R141 Gas pain: Secondary | ICD-10-CM

## 2012-11-02 DIAGNOSIS — R140 Abdominal distension (gaseous): Secondary | ICD-10-CM

## 2012-11-02 DIAGNOSIS — K3189 Other diseases of stomach and duodenum: Secondary | ICD-10-CM

## 2012-11-02 DIAGNOSIS — K219 Gastro-esophageal reflux disease without esophagitis: Secondary | ICD-10-CM

## 2012-11-02 MED ORDER — OMEPRAZOLE-SODIUM BICARBONATE 40-1100 MG PO CAPS: 1.0000 | ORAL_CAPSULE | Freq: Every day | ORAL | Status: DC

## 2012-11-02 NOTE — Patient Instructions (Addendum)
Try to decrease the dose of Diclofenac. We sent a prescription to [facility] in [place] for the Zegerid 4o mg. Take 1 tab 30 min before breakfast.  You have been scheduled for an endoscopy with propofol. Please follow written instructions given to you at your visit today. If you use inhalers (even only as needed), please bring them with you on the day of your procedure. Your physician has requested that you go to www.startemmi.com and enter the access code given to you at your visit today. This web site gives a general overview about your procedure. However, you should still follow specific instructions given to you by our office regarding your preparation for the procedure.

## 2012-11-02 NOTE — Progress Notes (Signed)
Subjective:    Patient ID: Sonya Cook, female    DOB: Jan 30, 1946, 67 y.o.   MRN: [id number]  HPI  Sonya Cook is a pleasant 67 year old white female known to Dr. Yancey Flemings from prior colonoscopy done in October of 2011 for screening. This was a normal exam. She comes in today for evaluation of reflux symptoms and dyspepsia. She states that she did have an endoscopy she believes a couple of years ago when she was hospitalized at [facility] with an acute GI bleed. She says she was told that no source for her bleeding was found that her hemoglobin had dropped from 14-9. We have obtained copies of those records and she did have an EGD in June of 2012 at [facility] and was found to have duodenitis. She says she has had chronic reflux symptoms and has been on Nexium over the past few years at least.. Currently taking low-dose at 20 mg by mouth daily She has had chronic back problems has required chronic pain medications and anti-inflammatories and says she is sure she was taking anti-inflammatories when she had the bleeding in 2012. She is currently on a new preparation of diclofenac called Zorvolox which she's taking 3 times daily. She says she's had a lot of difficulty over the past month or so with increased burping belching and gas area she denies any dysphagia or odynophagia she has mild heartburn symptoms and periodically will get pain in the epigastric area. She made this appointment because of a particularly bad episode of ongoing belching and burping she had lasted for a couple of days within the past week. Did not have any abdominal pain associated with this did have some nausea and felt full and bloated and uncomfortable    Review of Systems  Constitutional: Negative.   HENT: Negative.   Eyes: Negative.   Respiratory: Negative.   Cardiovascular: Negative.   Gastrointestinal: Positive for nausea and abdominal pain.  Endocrine: Negative.   Genitourinary: Negative.   Musculoskeletal:  Negative.   Skin: Negative.   Allergic/Immunologic: Negative.   Neurological: Negative.   Hematological: Negative.   Psychiatric/Behavioral: Negative.    Outpatient Prescriptions Prior to Visit  Medication Sig Dispense Refill  . Black Cohosh 160 MG CAPS Take by mouth.      . busPIRone (BUSPAR) 15 MG tablet 1/2 TO 1 Tablet every 8 hours as needed  90 tablet  0  . citalopram (CELEXA) 40 MG tablet Take 1 tablet (40 mg total) by mouth daily.  30 tablet  2  . conjugated estrogens (PREMARIN) vaginal cream Place vaginally daily.  42.5 g  12  . Cranberry 1000 MG CAPS Take by mouth.      . diazepam (VALIUM) 10 MG tablet at bedtime as needed.       . Diclofenac (ZORVOLEX) 35 MG CAPS Take 35 mg by mouth 3 (three) times daily.      Marland Kitchen esomeprazole (NEXIUM) 20 MG capsule Take 20 mg by mouth daily before breakfast.       . fenofibrate (TRICOR) 145 MG tablet Take 1 tablet (145 mg total) by mouth daily.  30 tablet  5  . fentaNYL (DURAGESIC - DOSED MCG/HR) 50 MCG/HR Place 1 patch onto the skin every 3 (three) days. 75 mcg once every 3 days      . hydrochlorothiazide (HYDRODIURIL) 25 MG tablet Take 0.5 tablets (12.5 mg total) by mouth daily.  30 tablet  3  . HYDROcodone-acetaminophen (NORCO) 10-325 MG per tablet Take 1 tablet by mouth  2 (two) times daily.       Marland Kitchen lisinopril (PRINIVIL,ZESTRIL) 10 MG tablet Take 1 tablet (10 mg total) by mouth daily.  90 tablet  0  . Misc Natural Products (HEALTHY LIVER PO) Take by mouth.      . Multiple Vitamin (MULTIVITAMIN) tablet Take 1 tablet by mouth daily.      Marland Kitchen NOVOLOG FLEXPEN 100 UNIT/ML injection       . omega-3 acid ethyl esters (LOVAZA) 1 G capsule Take two capsules by mouth daily  120 capsule  1  . simvastatin (ZOCOR) 40 MG tablet Take 1 tablet (40 mg total) by mouth at bedtime.  30 tablet  2   No facility-administered medications prior to visit.       Allergies  Allergen Reactions  . Codeine     REACTION: hives  . Penicillins     REACTION: hives    Patient Active Problem List   Diagnosis Date Noted  . GERD (gastroesophageal reflux disease) 11/02/2012  . Depression 08/30/2012  . Osteopenia 08/30/2012  . Diabetes 08/30/2012  . Hyperlipidemia 08/30/2012  . Renal insufficiency 08/30/2012   History  Substance Use Topics  . Smoking status: Former Smoker    Types: Cigarettes    Start date: 04/22/2000  . Smokeless tobacco: Never Used  . Alcohol Use: No   family history includes Cancer in her father and Hodgkin's lymphoma in her mother.  Objective:   Physical Exam  well-developed white female in no acute distress, pleasant blood pressure 100/60 pulse 66 height 5 foot 1 weight 165. HEEN;T nontraumatic normocephalic EOMI PERRLA sclera anicteric, Supple; no JVD, Cardiovascular; regular rate and rhythm with S1-S2 no murmur or gallop, Pulmonary; clear bilaterally, Abdomen; soft basically nontender there is no palpable mass or hepatosplenomegaly no guarding or rebound bowel sounds are present, Rectal ;exam not done, Extremities; no clubbing cyanosis or edema skin warm and dry, Psych; mood and affect normal and appropriate        Assessment & Plan:  #25 67 year old female with history of duodenitis and GI bleeding 2012 with hospitalization at [facility] #2 recent increase in reflux, gas belching and bloating despite Nexium therapy. Patient has recently started on diclofenac and also TriCor and suspect that her his dyspeptic symptoms are a combination of both meds. Will need to rule out gastropathy or peptic ulcer disease #3 chronic GERD #4 diabetes mellitus #5 hyperlipidemia #6 colon neoplasia screening negative colonoscopy October 2011  Plan; Will switch from Nexium 20 mg to Zegerid 40 mg by mouth every morning, samples and a prescription were given She's encouraged to decrease use of diclofenac Schedule for upper endoscopy with Dr. Garner Gavel discussed in detail with the patient she is agreeable to proceed

## 2012-11-02 NOTE — Progress Notes (Signed)
Agree with initial assessment and plans 

## 2012-11-16 ENCOUNTER — Other Ambulatory Visit: Payer: Self-pay

## 2012-11-16 MED ORDER — BUSPIRONE HCL 15 MG PO TABS: ORAL_TABLET | ORAL | Status: DC

## 2012-11-18 ENCOUNTER — Ambulatory Visit (AMBULATORY_SURGERY_CENTER): Payer: Medicare Other | Admitting: Internal Medicine

## 2012-11-18 ENCOUNTER — Encounter: Payer: Self-pay | Admitting: Internal Medicine

## 2012-11-18 VITALS — BP 127/69 | HR 63 | Temp 98.4°F | Resp 18 | Ht 61.0 in | Wt 165.0 lb

## 2012-11-18 DIAGNOSIS — K2970 Gastritis, unspecified, without bleeding: Secondary | ICD-10-CM

## 2012-11-18 DIAGNOSIS — K219 Gastro-esophageal reflux disease without esophagitis: Secondary | ICD-10-CM

## 2012-11-18 LAB — GLUCOSE, CAPILLARY: Glucose-Capillary: 93 mg/dL (ref 70–99)

## 2012-11-18 MED ORDER — SODIUM CHLORIDE 0.9 % IV SOLN: 500.0000 mL | INTRAVENOUS | Status: DC

## 2012-11-18 NOTE — Progress Notes (Signed)
Called to room to assist during endoscopic procedure.  Patient ID and intended procedure confirmed with present staff. Received instructions for my participation in the procedure from the performing physician.  

## 2012-11-18 NOTE — Patient Instructions (Addendum)

## 2012-11-18 NOTE — Progress Notes (Signed)
Patient did not experience any of the following events: a burn prior to discharge; a fall within the facility; wrong site/side/patient/procedure/implant event; or a hospital transfer or hospital admission upon discharge from the facility. (G8907) Patient did not have preoperative order for IV antibiotic SSI prophylaxis. (G8918)  

## 2012-11-18 NOTE — Op Note (Signed)
[facility] [address] [place], [address]   ENDOSCOPY PROCEDURE REPORT  PATIENT: [name], Sonya  MR#: [id number] BIRTHDATE: [birth year] , 67  yrs. old GENDER: Female ENDOSCOPIST: Roxy Cedar, MD REFERRED BY:  .  Self / Office PROCEDURE DATE:  11/18/2012 PROCEDURE:  EGD w/ biopsy ASA CLASS:     Class II INDICATIONS:  Dyspepsia.   Epigastric pain.   History of esophageal reflux.  Feeling better since OV and medication adjustments MEDICATIONS: MAC sedation, administered by CRNA and propofol (Diprivan) 100mg  IV TOPICAL ANESTHETIC: none  DESCRIPTION OF PROCEDURE: After the risks benefits and alternatives of the procedure were thoroughly explained, informed consent was obtained.  The LB [id number] endoscope was introduced through the mouth and advanced to the second portion of the duodenum. Without limitations.  The instrument was slowly withdrawn as the mucosa was fully examined.      EXAM: Normal esophagus.  Mild linear antral erythema, but otherwise normal stomach.  Normal duodenum.  CLO bx taken.  Retroflexed views revealed no abnormalities.     The scope was then withdrawn from the patient and the procedure completed.  COMPLICATIONS: There were no complications.  ENDOSCOPIC IMPRESSION: 1.  Mild antral gastritis. Otherwise normal exam 2. GERD  RECOMMENDATIONS: 1.  Continue PPI (Zegerid) 2.  Avoid NSAIDS (anti-inflammatories) if possible 3.  Return to the care of Dr. Christell Constant.  REPEAT EXAM:  eSigned:  Roxy Cedar, MD 11/18/2012 1:53 PM   ON:GEXBMW Christell Constant, MD and The Patient

## 2012-11-18 NOTE — Progress Notes (Signed)
Wearing Fentanyl patch on right shoulder for back pain

## 2012-11-18 NOTE — Progress Notes (Signed)
Lidocaine-40mg IV prior to Propofol InductionPropofol given over incremental dosages 

## 2012-11-19 ENCOUNTER — Telehealth: Payer: Self-pay | Admitting: *Deleted

## 2012-11-19 LAB — HELICOBACTER PYLORI SCREEN-BIOPSY: UREASE: NEGATIVE

## 2012-11-19 NOTE — Telephone Encounter (Signed)
  Follow up Call-  Call back number 11/18/2012  Post procedure Call Back phone  # [phone]  Permission to leave phone message Yes     Patient questions:  Do you have a fever, pain , or abdominal swelling? no Pain Score  0 *  Have you tolerated food without any problems? yes  Have you been able to return to your normal activities? yes  Do you have any questions about your discharge instructions: Diet   no Medications  no Follow up visit  no  Do you have questions or concerns about your Care? no  Actions: * If pain score is 4 or above: No action needed, pain <4.

## 2012-12-15 ENCOUNTER — Other Ambulatory Visit: Payer: Self-pay | Admitting: *Deleted

## 2012-12-15 MED ORDER — OMEGA-3-ACID ETHYL ESTERS 1 G PO CAPS: ORAL_CAPSULE | ORAL | Status: DC

## 2012-12-15 MED ORDER — LISINOPRIL 10 MG PO TABS: 10.0000 mg | ORAL_TABLET | Freq: Every day | ORAL | Status: DC

## 2012-12-15 MED ORDER — HYDROCHLOROTHIAZIDE 25 MG PO TABS: 12.5000 mg | ORAL_TABLET | Freq: Every day | ORAL | Status: DC

## 2012-12-15 MED ORDER — SIMVASTATIN 40 MG PO TABS: 40.0000 mg | ORAL_TABLET | Freq: Every day | ORAL | Status: DC

## 2012-12-15 NOTE — Telephone Encounter (Signed)
MAE RF HYDROCHLOROTHIAZIDE ON 7/14 FOR 1/2 QD. REFILL REQUEST FROM THE DRUG STORE WAS FOR 25MG  ONE QD. LOOKS LIKE MAE CHANGED DOSE TO I/2 QD. PLEASE REVIEW BEFORE REFILLING. OMEGA 3 WAS IN EPIC AS TWO CAPSULES DAILY BUT REFILL REQUEST WAS FOR 2 CAPSULES BY MOUTH TWICE DAILY. LAST LABS 6/14.

## 2012-12-16 ENCOUNTER — Other Ambulatory Visit: Payer: Self-pay

## 2012-12-16 MED ORDER — CITALOPRAM HYDROBROMIDE 40 MG PO TABS: 40.0000 mg | ORAL_TABLET | Freq: Every day | ORAL | Status: DC

## 2013-01-28 ENCOUNTER — Other Ambulatory Visit: Payer: Self-pay | Admitting: *Deleted

## 2013-01-28 MED ORDER — CITALOPRAM HYDROBROMIDE 40 MG PO TABS: 40.0000 mg | ORAL_TABLET | Freq: Every day | ORAL | Status: DC

## 2013-02-02 ENCOUNTER — Ambulatory Visit (INDEPENDENT_AMBULATORY_CARE_PROVIDER_SITE_OTHER): Payer: Medicare Other | Admitting: Nurse Practitioner

## 2013-02-02 VITALS — BP 128/69 | HR 66 | Temp 98.0°F | Ht 61.0 in | Wt 171.0 lb

## 2013-02-02 DIAGNOSIS — E785 Hyperlipidemia, unspecified: Secondary | ICD-10-CM

## 2013-02-02 DIAGNOSIS — E119 Type 2 diabetes mellitus without complications: Secondary | ICD-10-CM

## 2013-02-02 LAB — POCT GLYCOSYLATED HEMOGLOBIN (HGB A1C): Hemoglobin A1C: 5.9

## 2013-02-02 NOTE — Progress Notes (Signed)
Subjective:    Patient ID: Sonya Cook, female    DOB: 04/21/1945, 67 y.o.   MRN: [id number]  Hyperlipidemia This is a chronic problem. The current episode started more than 1 year ago. The problem is controlled. Recent lipid tests were reviewed and are normal. Exacerbating diseases include diabetes. Pertinent negatives include no chest pain, focal weakness, leg pain, myalgias or shortness of breath. Current antihyperlipidemic treatment includes statins (lovaza). The current treatment provides moderate improvement of lipids. Compliance problems include adherence to diet and adherence to exercise.  Risk factors for coronary artery disease include diabetes mellitus and post-menopausal.  Diabetes She presents for her follow-up diabetic visit. She has type 2 diabetes mellitus. No MedicAlert identification noted. The initial diagnosis of diabetes was made 7 years ago. Her disease course has been stable. There are no hypoglycemic associated symptoms. Pertinent negatives for diabetes include no chest pain, no polydipsia, no polyphagia, no polyuria, no weakness and no weight loss. There are no hypoglycemic complications. Symptoms are stable. There are no diabetic complications. Risk factors for coronary artery disease include dyslipidemia and post-menopausal. Current diabetic treatment includes insulin injections (sliding scale based on carbs planning to eat at that meal.). She is compliant with treatment all of the time. Her weight is stable. She is following a diabetic diet. When asked about meal planning, she reported none. She has not had a previous visit with a dietician. She never participates in exercise. There is no change in her home blood glucose trend. Her breakfast blood glucose is taken between 8-9 am. Her breakfast blood glucose range is generally 110-130 mg/dl. Her lunch blood glucose is taken between 1-2 pm. Her lunch blood glucose range is generally 130-140 mg/dl. Her dinner blood glucose is  taken between 7-8 pm. Her dinner blood glucose range is generally 140-180 mg/dl. Her overall blood glucose range is 130-140 mg/dl. An ACE inhibitor/angiotensin II receptor blocker is being taken. She does not see a podiatrist.Eye exam is current (1 1/2 years ago- Patient encouraged to make appointment.).  Hypertension This is a chronic problem. The current episode started more than 1 year ago. The problem is unchanged. The problem is controlled. Pertinent negatives include no chest pain, neck pain, palpitations, peripheral edema or shortness of breath. There are no associated agents to hypertension. Risk factors for coronary artery disease include dyslipidemia, family history, diabetes mellitus and post-menopausal state. Past treatments include ACE inhibitors and diuretics. The current treatment provides moderate improvement. Compliance problems include diet and exercise.   GERD Omerpazole- works well- Keeps symptoms under control Chronic back pain Goes to pain management- On fentanyl patch and norco GAD Buspar 2-3 times a week which works great  Review of Systems  Constitutional: Negative.  Negative for weight loss.  Respiratory: Negative for cough, chest tightness and shortness of breath.   Cardiovascular: Negative for chest pain and palpitations.  Endocrine: Negative for polydipsia, polyphagia and polyuria.  Musculoskeletal: Positive for back pain. Negative for myalgias and neck pain.  Neurological: Negative for focal weakness and weakness.  Hematological: Negative.   Psychiatric/Behavioral: Negative.     Objective:   Physical Exam  Constitutional: She is oriented to person, place, and time. She appears well-developed and well-nourished.  HENT:  Nose: Nose normal.  Mouth/Throat: Oropharynx is clear and moist.  Eyes: EOM are normal.  Neck: Trachea normal, normal range of motion and full passive range of motion without pain. Neck supple. No JVD present. Carotid bruit is not present. No  thyromegaly present.  Cardiovascular: Normal  rate, regular rhythm, normal heart sounds and intact distal pulses.  Exam reveals no gallop and no friction rub.   No murmur heard. Pulmonary/Chest: Effort normal and breath sounds normal.  Abdominal: Soft. Bowel sounds are normal. She exhibits no distension and no mass. There is no tenderness.  Musculoskeletal: Normal range of motion.  Lymphadenopathy:    She has no cervical adenopathy.  Neurological: She is alert and oriented to person, place, and time. She has normal reflexes.  Skin: Skin is warm and dry.  Psychiatric: She has a normal mood and affect. Her behavior is normal. Judgment and thought content normal.   BP 128/69  Pulse 66  Temp(Src) 98 F (36.7 C) (Oral)  Ht 5\' 1"  (1.549 m)  Wt 171 lb (77.565 kg)  BMI 32.33 kg/m2       Assessment & Plan:

## 2013-02-02 NOTE — Patient Instructions (Signed)

## 2013-02-03 LAB — CMP14+EGFR
Albumin/Globulin Ratio: 2.2 (ref 1.1–2.5)
Calcium: 9.8 mg/dL (ref 8.6–10.2)
Creatinine, Ser: 1.41 mg/dL — ABNORMAL HIGH (ref 0.57–1.00)
GFR calc Af Amer: 45 mL/min/{1.73_m2} — ABNORMAL LOW (ref >59)
GFR calc non Af Amer: 39 mL/min/{1.73_m2} — ABNORMAL LOW (ref >59)
Globulin, Total: 2 g/dL (ref 1.5–4.5)
Glucose: 149 mg/dL — ABNORMAL HIGH (ref 65–99)
Total Bilirubin: 0.3 mg/dL (ref 0.0–1.2)
Total Protein: 6.4 g/dL (ref 6.0–8.5)

## 2013-02-03 LAB — LIPID PANEL
Chol/HDL Ratio: 3.8 ratio units (ref 0.0–4.4)
HDL: 41 mg/dL (ref >39)
Triglycerides: 195 mg/dL — ABNORMAL HIGH (ref 0–149)

## 2013-02-08 ENCOUNTER — Telehealth: Payer: Self-pay | Admitting: Nurse Practitioner

## 2013-02-09 NOTE — Telephone Encounter (Signed)
Pt found results on My Chart.

## 2013-03-10 ENCOUNTER — Telehealth: Payer: Self-pay | Admitting: Nurse Practitioner

## 2013-03-10 NOTE — Telephone Encounter (Signed)
appt scheduled

## 2013-03-17 ENCOUNTER — Ambulatory Visit (INDEPENDENT_AMBULATORY_CARE_PROVIDER_SITE_OTHER): Payer: Medicare Other | Admitting: Nurse Practitioner

## 2013-03-17 ENCOUNTER — Encounter: Payer: Self-pay | Admitting: Nurse Practitioner

## 2013-03-17 VITALS — BP 131/80 | HR 75 | Temp 98.8°F | Ht 61.0 in | Wt 160.0 lb

## 2013-03-17 DIAGNOSIS — E871 Hypo-osmolality and hyponatremia: Secondary | ICD-10-CM

## 2013-03-17 DIAGNOSIS — N289 Disorder of kidney and ureter, unspecified: Secondary | ICD-10-CM

## 2013-03-17 DIAGNOSIS — Z09 Encounter for follow-up examination after completed treatment for conditions other than malignant neoplasm: Secondary | ICD-10-CM

## 2013-03-17 NOTE — Patient Instructions (Signed)
Hyponatremia   Hyponatremia is when the amount of salt (sodium) in your blood is too low. When sodium levels are low, your cells will absorb extra water and swell. The swelling happens throughout the body, but it mostly affects the brain. Severe brain swelling (cerebral edema), seizures, or coma can happen.   CAUSES    Heart, kidney, or liver problems.   Thyroid problems.   Adrenal gland problems.   Severe vomiting and diarrhea.   Certain medicines or illegal drugs.   Dehydration.   Drinking too much water.   Low-sodium diet.  SYMPTOMS    Nausea and vomiting.   Confusion.   Lethargy.   Agitation.   Headache.   Twitching or shaking (seizures).   Unconsciousness.   Appetite loss.   Muscle weakness and cramping.  DIAGNOSIS   Hyponatremia is identified by a simple blood test. Your caregiver will perform a history and physical exam to try to find the cause and type of hyponatremia. Other tests may be needed to measure the amount of sodium in your blood and urine.  TREATMENT   Treatment will depend on the cause.    Fluids may be given through the vein (IV).   Medicines may be used to correct the sodium imbalance. If medicines are causing the problem, they will need to be adjusted.   Water or fluid intake may be restricted to restore proper balance.  The speed of correcting the sodium problem is very important. If the problem is corrected too fast, nerve damage (sometimes unchangeable) can happen.  HOME CARE INSTRUCTIONS    Only take medicines as directed by your caregiver. Many medicines can make hyponatremia worse. Discuss all your medicines with your caregiver.   Carefully follow any recommended diet, including any fluid restrictions.   You may be asked to repeat lab tests. Follow these directions.   Avoid alcohol and recreational drugs.  SEEK MEDICAL CARE IF:    You develop worsening nausea, fatigue, headache, confusion, or weakness.   Your original hyponatremia symptoms return.   You have  problems following the recommended diet.  SEEK IMMEDIATE MEDICAL CARE IF:    You have a seizure.   You faint.   You have ongoing diarrhea or vomiting.  MAKE SURE YOU:    Understand these instructions.   Will watch your condition.   Will get help right away if you are not doing well or get worse.  Document Released: 03/07/2002 Document Revised: 06/09/2011 Document Reviewed: 09/01/2010  [employer] Patient Information 2014 [employer].

## 2013-03-17 NOTE — Progress Notes (Signed)
   Subjective:    Patient ID: Sonya Cook, female    DOB: 1945-06-05, 67 y.o.   MRN: [id number]  HPI Patient here today for hospital follow up- went in hospital a week ago this past Sunday- Diagnosed with hyponatremia from a diet that she was on that required her to drink lots of water.  Had acute renal insufficiency. She is better since getting out of hospital.    Review of Systems  Constitutional: Negative.   HENT: Negative.   Respiratory: Negative.   Cardiovascular: Negative.   Gastrointestinal: Negative.   Genitourinary: Negative.   Musculoskeletal: Negative.   Neurological: Negative.   Hematological: Negative.   Psychiatric/Behavioral: Negative.        Objective:   Physical Exam  Constitutional: She is oriented to person, place, and time. She appears well-developed and well-nourished.  HENT:  Nose: Nose normal.  Mouth/Throat: Oropharynx is clear and moist.  Eyes: EOM are normal.  Neck: Trachea normal, normal range of motion and full passive range of motion without pain. Neck supple. No JVD present. Carotid bruit is not present. No thyromegaly present.  Cardiovascular: Normal rate, regular rhythm, normal heart sounds and intact distal pulses.  Exam reveals no gallop and no friction rub.   No murmur heard. Pulmonary/Chest: Effort normal and breath sounds normal.  Abdominal: Soft. Bowel sounds are normal. She exhibits no distension and no mass. There is no tenderness.  Musculoskeletal: Normal range of motion.  Lymphadenopathy:    She has no cervical adenopathy.  Neurological: She is alert and oriented to person, place, and time. She has normal reflexes.  Skin: Skin is warm and dry.  Psychiatric: She has a normal mood and affect. Her behavior is normal. Judgment and thought content normal.   BP 131/80  Pulse 75  Temp(Src) 98.8 F (37.1 C) (Oral)  Ht 5\' 1"  (1.549 m)  Wt 160 lb (72.576 kg)  BMI 30.25 kg/m2        Assessment & Plan:   1. Hospital discharge  follow-up   2. Hyponatremia   3. Acute renal insufficiency    Orders Placed This Encounter  Procedures  . BMP8+EGFR  reviewed hospital records Keep follow up with nephrologist Continue all meds Follow up in 1 month  Mary-Margaret Daphine Deutscher, FNP

## 2013-03-18 LAB — BMP8+EGFR
BUN/Creatinine Ratio: 11 (ref 11–26)
Calcium: 10.1 mg/dL (ref 8.6–10.2)
Creatinine, Ser: 1.75 mg/dL — ABNORMAL HIGH (ref 0.57–1.00)
GFR calc Af Amer: 34 mL/min/{1.73_m2} — ABNORMAL LOW (ref >59)
GFR calc non Af Amer: 30 mL/min/{1.73_m2} — ABNORMAL LOW (ref >59)
Potassium: 4.4 mmol/L (ref 3.5–5.2)
Sodium: 128 mmol/L — ABNORMAL LOW (ref 134–144)

## 2013-04-05 ENCOUNTER — Other Ambulatory Visit: Payer: Self-pay

## 2013-04-05 MED ORDER — BUSPIRONE HCL 15 MG PO TABS: ORAL_TABLET | ORAL | Status: DC

## 2013-04-05 MED ORDER — FENOFIBRATE 145 MG PO TABS: 145.0000 mg | ORAL_TABLET | Freq: Every day | ORAL | Status: DC

## 2013-04-05 MED ORDER — CITALOPRAM HYDROBROMIDE 40 MG PO TABS: 40.0000 mg | ORAL_TABLET | Freq: Every day | ORAL | Status: DC

## 2013-04-05 NOTE — Telephone Encounter (Signed)
x

## 2013-04-22 ENCOUNTER — Encounter: Payer: Self-pay | Admitting: Nurse Practitioner

## 2013-04-22 ENCOUNTER — Ambulatory Visit (INDEPENDENT_AMBULATORY_CARE_PROVIDER_SITE_OTHER): Payer: Medicare Other | Admitting: Nurse Practitioner

## 2013-04-22 VITALS — BP 143/71 | HR 57 | Temp 97.6°F | Ht 61.0 in | Wt 160.0 lb

## 2013-04-22 DIAGNOSIS — E871 Hypo-osmolality and hyponatremia: Secondary | ICD-10-CM

## 2013-04-22 DIAGNOSIS — Z87448 Personal history of other diseases of urinary system: Secondary | ICD-10-CM

## 2013-04-22 NOTE — Progress Notes (Signed)
Subjective:    Patient ID: Sonya Cook, female    DOB: 29-Aug-1945, 68 y.o.   MRN: [id number]  HPI  Patient was seen one month ago for hospital follow up from acute renal failure and hyponatremia from drinking to much water. She is here today to recheck her kidney function to make sure improving- Patient says that she s doing well from last visit.    Review of Systems  Respiratory: Negative.   Cardiovascular: Negative.   Gastrointestinal: Negative.   Genitourinary: Negative.   All other systems reviewed and are negative.       Objective:   Physical Exam  Constitutional: She is oriented to person, place, and time. She appears well-developed and well-nourished.  Cardiovascular: Normal rate, regular rhythm and normal heart sounds.   Pulmonary/Chest: Effort normal and breath sounds normal.  Neurological: She is alert and oriented to person, place, and time.  Skin: Skin is warm.  Psychiatric: She has a normal mood and affect. Her behavior is normal. Judgment and thought content normal.   BP 143/71  Pulse 57  Temp(Src) 97.6 F (36.4 C) (Oral)  Ht 5' 1" (1.549 m)  Wt 160 lb (72.576 kg)  BMI 30.25 kg/m2        Assessment & Plan:   1. H/O acute renal failure   2. Hyponatremia    Orders Placed This Encounter  Procedures  . CMP14+EGFR   No more then 5 8 ox glasses of water a day RTO prn  Mary-Margaret Hassell Done, FNP

## 2013-04-22 NOTE — Patient Instructions (Signed)
Hyponatremia   Hyponatremia is when the amount of salt (sodium) in your blood is too low. When sodium levels are low, your cells will absorb extra water and swell. The swelling happens throughout the body, but it mostly affects the brain. Severe brain swelling (cerebral edema), seizures, or coma can happen.   CAUSES    Heart, kidney, or liver problems.   Thyroid problems.   Adrenal gland problems.   Severe vomiting and diarrhea.   Certain medicines or illegal drugs.   Dehydration.   Drinking too much water.   Low-sodium diet.  SYMPTOMS    Nausea and vomiting.   Confusion.   Lethargy.   Agitation.   Headache.   Twitching or shaking (seizures).   Unconsciousness.   Appetite loss.   Muscle weakness and cramping.  DIAGNOSIS   Hyponatremia is identified by a simple blood test. Your caregiver will perform a history and physical exam to try to find the cause and type of hyponatremia. Other tests may be needed to measure the amount of sodium in your blood and urine.  TREATMENT   Treatment will depend on the cause.    Fluids may be given through the vein (IV).   Medicines may be used to correct the sodium imbalance. If medicines are causing the problem, they will need to be adjusted.   Water or fluid intake may be restricted to restore proper balance.  The speed of correcting the sodium problem is very important. If the problem is corrected too fast, nerve damage (sometimes unchangeable) can happen.  HOME CARE INSTRUCTIONS    Only take medicines as directed by your caregiver. Many medicines can make hyponatremia worse. Discuss all your medicines with your caregiver.   Carefully follow any recommended diet, including any fluid restrictions.   You may be asked to repeat lab tests. Follow these directions.   Avoid alcohol and recreational drugs.  SEEK MEDICAL CARE IF:    You develop worsening nausea, fatigue, headache, confusion, or weakness.   Your original hyponatremia symptoms return.   You have  problems following the recommended diet.  SEEK IMMEDIATE MEDICAL CARE IF:    You have a seizure.   You faint.   You have ongoing diarrhea or vomiting.  MAKE SURE YOU:    Understand these instructions.   Will watch your condition.   Will get help right away if you are not doing well or get worse.  Document Released: 03/07/2002 Document Revised: 06/09/2011 Document Reviewed: 09/01/2010  [employer] Patient Information 2014 [employer].

## 2013-04-23 LAB — CMP14+EGFR
A/G RATIO: 2.1 (ref 1.1–2.5)
ALT: 15 IU/L (ref 0–32)
AST: 21 IU/L (ref 0–40)
Albumin: 4.5 g/dL (ref 3.6–4.8)
Alkaline Phosphatase: 32 IU/L — ABNORMAL LOW (ref 39–117)
BILIRUBIN TOTAL: 0.2 mg/dL (ref 0.0–1.2)
BUN / CREAT RATIO: 12 (ref 11–26)
BUN: 18 mg/dL (ref 8–27)
CO2: 25 mmol/L (ref 18–29)
Calcium: 9.7 mg/dL (ref 8.7–10.3)
Chloride: 99 mmol/L (ref 97–108)
Creatinine, Ser: 1.49 mg/dL — ABNORMAL HIGH (ref 0.57–1.00)
GFR calc non Af Amer: 36 mL/min/{1.73_m2} — ABNORMAL LOW (ref >59)
GFR, EST AFRICAN AMERICAN: 42 mL/min/{1.73_m2} — AB (ref >59)
Globulin, Total: 2.1 g/dL (ref 1.5–4.5)
Glucose: 123 mg/dL — ABNORMAL HIGH (ref 65–99)
POTASSIUM: 5.6 mmol/L — AB (ref 3.5–5.2)
Sodium: 138 mmol/L (ref 134–144)
Total Protein: 6.6 g/dL (ref 6.0–8.5)

## 2013-04-27 ENCOUNTER — Telehealth: Payer: Self-pay | Admitting: Nurse Practitioner

## 2013-04-27 NOTE — Telephone Encounter (Signed)
Yes needs to have BMP to recheck potassium

## 2013-04-28 NOTE — Telephone Encounter (Signed)
Pt notified and verbalized understanding.

## 2013-04-29 ENCOUNTER — Other Ambulatory Visit (INDEPENDENT_AMBULATORY_CARE_PROVIDER_SITE_OTHER): Payer: Medicare Other

## 2013-04-29 DIAGNOSIS — R7989 Other specified abnormal findings of blood chemistry: Secondary | ICD-10-CM

## 2013-04-30 LAB — CMP14+EGFR
A/G RATIO: 2.1 (ref 1.1–2.5)
ALT: 15 IU/L (ref 0–32)
AST: 21 IU/L (ref 0–40)
Albumin: 4.7 g/dL (ref 3.6–4.8)
Alkaline Phosphatase: 33 IU/L — ABNORMAL LOW (ref 39–117)
BUN/Creatinine Ratio: 19 (ref 11–26)
BUN: 29 mg/dL — ABNORMAL HIGH (ref 8–27)
CALCIUM: 10.5 mg/dL — AB (ref 8.7–10.3)
CO2: 24 mmol/L (ref 18–29)
Chloride: 100 mmol/L (ref 97–108)
Creatinine, Ser: 1.55 mg/dL — ABNORMAL HIGH (ref 0.57–1.00)
GFR calc non Af Amer: 35 mL/min/{1.73_m2} — ABNORMAL LOW (ref >59)
GFR, EST AFRICAN AMERICAN: 40 mL/min/{1.73_m2} — AB (ref >59)
Globulin, Total: 2.2 g/dL (ref 1.5–4.5)
Glucose: 154 mg/dL — ABNORMAL HIGH (ref 65–99)
POTASSIUM: 5 mmol/L (ref 3.5–5.2)
SODIUM: 141 mmol/L (ref 134–144)
Total Bilirubin: <0.2 mg/dL (ref 0.0–1.2)
Total Protein: 6.9 g/dL (ref 6.0–8.5)

## 2013-06-20 ENCOUNTER — Other Ambulatory Visit: Payer: Self-pay | Admitting: Nurse Practitioner

## 2013-06-22 NOTE — Telephone Encounter (Signed)
Patient NTBS for follow up and lab work  

## 2013-07-07 ENCOUNTER — Other Ambulatory Visit: Payer: Self-pay | Admitting: Nurse Practitioner

## 2013-07-28 ENCOUNTER — Other Ambulatory Visit: Payer: Self-pay | Admitting: Nurse Practitioner

## 2013-07-30 NOTE — Telephone Encounter (Signed)
Refill denied   Patient NTBS for follow up and lab work

## 2013-07-30 NOTE — Telephone Encounter (Signed)
Message left that she would need to be seen for any refill

## 2013-08-07 ENCOUNTER — Other Ambulatory Visit: Payer: Self-pay | Admitting: Family Medicine

## 2013-08-09 NOTE — Telephone Encounter (Signed)
Patient NTBS for follow up and lab work  

## 2013-08-18 ENCOUNTER — Encounter: Payer: Self-pay | Admitting: Nurse Practitioner

## 2013-08-18 ENCOUNTER — Ambulatory Visit (INDEPENDENT_AMBULATORY_CARE_PROVIDER_SITE_OTHER): Payer: Medicare Other | Admitting: Nurse Practitioner

## 2013-08-18 VITALS — BP 147/67 | HR 59 | Temp 98.0°F | Ht 61.0 in | Wt 163.8 lb

## 2013-08-18 DIAGNOSIS — E119 Type 2 diabetes mellitus without complications: Secondary | ICD-10-CM

## 2013-08-18 DIAGNOSIS — M8580 Other specified disorders of bone density and structure, unspecified site: Secondary | ICD-10-CM

## 2013-08-18 DIAGNOSIS — M949 Disorder of cartilage, unspecified: Secondary | ICD-10-CM

## 2013-08-18 DIAGNOSIS — Z23 Encounter for immunization: Secondary | ICD-10-CM

## 2013-08-18 DIAGNOSIS — M899 Disorder of bone, unspecified: Secondary | ICD-10-CM

## 2013-08-18 DIAGNOSIS — F3289 Other specified depressive episodes: Secondary | ICD-10-CM

## 2013-08-18 DIAGNOSIS — K219 Gastro-esophageal reflux disease without esophagitis: Secondary | ICD-10-CM

## 2013-08-18 DIAGNOSIS — F32A Depression, unspecified: Secondary | ICD-10-CM

## 2013-08-18 DIAGNOSIS — E785 Hyperlipidemia, unspecified: Secondary | ICD-10-CM

## 2013-08-18 DIAGNOSIS — F329 Major depressive disorder, single episode, unspecified: Secondary | ICD-10-CM

## 2013-08-18 DIAGNOSIS — N289 Disorder of kidney and ureter, unspecified: Secondary | ICD-10-CM

## 2013-08-18 LAB — POCT GLYCOSYLATED HEMOGLOBIN (HGB A1C): HEMOGLOBIN A1C: 6.8

## 2013-08-18 MED ORDER — ESCITALOPRAM OXALATE 20 MG PO TABS: 20.0000 mg | ORAL_TABLET | Freq: Every day | ORAL | Status: DC

## 2013-08-18 NOTE — Progress Notes (Signed)
Subjective:    Patient ID: Sonya Cook, female    DOB: [birth year], 68 y.o.   MRN: [id number]  Patient here today for follow up of chronic medical problems.   Hyperlipidemia This is a chronic problem. The current episode started more than 1 year ago. The problem is controlled. Recent lipid tests were reviewed and are normal. Exacerbating diseases include diabetes. Pertinent negatives include no chest pain, focal weakness, leg pain, myalgias or shortness of breath. Current antihyperlipidemic treatment includes statins (lovaza). The current treatment provides moderate improvement of lipids. Compliance problems include adherence to diet and adherence to exercise.  Risk factors for coronary artery disease include diabetes mellitus and post-menopausal.  Diabetes She presents for her follow-up diabetic visit. She has type 2 diabetes mellitus. No MedicAlert identification noted. The initial diagnosis of diabetes was made 7 years ago. Her disease course has been stable. There are no hypoglycemic associated symptoms. Pertinent negatives for diabetes include no chest pain, no polydipsia, no polyphagia, no polyuria, no weakness and no weight loss. There are no hypoglycemic complications. Symptoms are stable. There are no diabetic complications. Risk factors for coronary artery disease include dyslipidemia and post-menopausal. Current diabetic treatment includes insulin injections (sliding scale based on carbs planning to eat at that meal.). She is compliant with treatment all of the time. Her weight is stable. She is following a diabetic diet. When asked about meal planning, she reported none. She has not had a previous visit with a dietician. She never participates in exercise. There is no change in her home blood glucose trend. Her breakfast blood glucose is taken between 8-9 am. Her breakfast blood glucose range is generally 110-130 mg/dl. Her lunch blood glucose is taken between 1-2 pm. Her lunch blood glucose  range is generally 130-140 mg/dl. Her dinner blood glucose is taken between 7-8 pm. Her dinner blood glucose range is generally 140-180 mg/dl. Her overall blood glucose range is 130-140 mg/dl. An ACE inhibitor/angiotensin II receptor blocker is being taken. She does not see a podiatrist.Eye exam is current (1 1/2 years ago- Patient encouraged to make appointment.).  Hypertension This is a chronic problem. The current episode started more than 1 year ago. The problem is unchanged. The problem is controlled. Pertinent negatives include no chest pain, neck pain, palpitations, peripheral edema or shortness of breath. There are no associated agents to hypertension. Risk factors for coronary artery disease include dyslipidemia, family history, diabetes mellitus and post-menopausal state. Past treatments include ACE inhibitors and diuretics. The current treatment provides moderate improvement. Compliance problems include diet and exercise.   GERD Omerpazole- works well- Keeps symptoms under control Chronic back pain Goes to pain management- On fentanyl patch and norco GAD Buspar 2-3 times a week which works great  Review of Systems  Constitutional: Negative.  Negative for weight loss.  Respiratory: Negative for cough, chest tightness and shortness of breath.   Cardiovascular: Negative for chest pain and palpitations.  Endocrine: Negative for polydipsia, polyphagia and polyuria.  Musculoskeletal: Positive for back pain. Negative for myalgias and neck pain.  Neurological: Negative for focal weakness and weakness.  Hematological: Negative.   Psychiatric/Behavioral: Negative.     Objective:   Physical Exam  Constitutional: She is oriented to person, place, and time. She appears well-developed and well-nourished.  HENT:  Nose: Nose normal.  Mouth/Throat: Oropharynx is clear and moist.  Eyes: EOM are normal.  Neck: Trachea normal, normal range of motion and full passive range of motion without pain.  Neck supple. No JVD  present. Carotid bruit is not present. No thyromegaly present.  Cardiovascular: Normal rate, regular rhythm, normal heart sounds and intact distal pulses.  Exam reveals no gallop and no friction rub.   No murmur heard. Pulmonary/Chest: Effort normal and breath sounds normal.  Abdominal: Soft. Bowel sounds are normal. She exhibits no distension and no mass. There is no tenderness.  Musculoskeletal: Normal range of motion.  Lymphadenopathy:    She has no cervical adenopathy.  Neurological: She is alert and oriented to person, place, and time. She has normal reflexes.  Skin: Skin is warm and dry.  Psychiatric: She has a normal mood and affect. Her behavior is normal. Judgment and thought content normal.   BP 147/67  Pulse 59  Temp(Src) 98 F (36.7 C) (Oral)  Ht 5' 1"  (1.549 m)  Wt 163 lb 12.8 oz (74.299 kg)  BMI 30.97 kg/m2  Results for orders placed in visit on 08/18/13  POCT GLYCOSYLATED HEMOGLOBIN (HGB A1C)      Result Value Ref Range   Hemoglobin A1C 6.8%          Assessment & Plan:   1. Diabetes   2. Hyperlipidemia   3. Osteopenia   4. Renal insufficiency   5. GERD (gastroesophageal reflux disease)   6. Depression    Orders Placed This Encounter  Procedures  . NMR, lipoprofile  . CMP14+EGFR  . POCT glycosylated hemoglobin (Hb A1C)   Meds ordered this encounter  Medications  . hydrochlorothiazide (HYDRODIURIL) 25 MG tablet    Sig:   . DISCONTD: FLECTOR 1.3 % PTCH    Sig:   . DISCONTD: ZOHYDRO ER 15 MG C12A    Sig:   . UNIFINE PENTIPS 31G X 6 MM MISC    Sig:   . escitalopram (LEXAPRO) 20 MG tablet    Sig: Take 1 tablet (20 mg total) by mouth daily.    Dispense:  30 tablet    Refill:  5    Order Specific Question:  Supervising Provider    Answer:  Chipper Herb [[id number]]  hemoccult cards given to patient with directions Stop celexa and change to lexapro Labs pending Health maintenance reviewed Diet and exercise encouraged Continue all  meds Follow up  In 3 months   [place], [place] Hassell Done, FNP

## 2013-08-18 NOTE — Addendum Note (Signed)
Addended by: Waverly Ferrari on: 08/18/2013 10:13 AM   Modules accepted: Orders

## 2013-08-18 NOTE — Patient Instructions (Signed)

## 2013-08-19 LAB — NMR, LIPOPROFILE
Cholesterol: 221 mg/dL — ABNORMAL HIGH (ref 100–199)
HDL Cholesterol by NMR: 32 mg/dL — ABNORMAL LOW (ref >39)
HDL PARTICLE NUMBER: 27.3 umol/L — AB (ref >=30.5)
LDL Particle Number: 1019 nmol/L — ABNORMAL HIGH (ref <1000)
LDL SIZE: 19.5 nm (ref >20.5)
LDLC SERPL CALC-MCNC: 126 mg/dL — ABNORMAL HIGH (ref 0–99)
LP-IR Score: 57 — ABNORMAL HIGH (ref <=45)
SMALL LDL PARTICLE NUMBER: 833 nmol/L — AB (ref <=527)
TRIGLYCERIDES BY NMR: 316 mg/dL — AB (ref 0–149)

## 2013-08-19 LAB — CMP14+EGFR
ALBUMIN: 4.4 g/dL (ref 3.6–4.8)
ALK PHOS: 32 IU/L — AB (ref 39–117)
ALT: 13 IU/L (ref 0–32)
AST: 15 IU/L (ref 0–40)
Albumin/Globulin Ratio: 2.2 (ref 1.1–2.5)
BUN/Creatinine Ratio: 20 (ref 11–26)
BUN: 41 mg/dL — AB (ref 8–27)
CO2: 26 mmol/L (ref 18–29)
Calcium: 9.4 mg/dL (ref 8.7–10.3)
Chloride: 98 mmol/L (ref 97–108)
Creatinine, Ser: 2.02 mg/dL — ABNORMAL HIGH (ref 0.57–1.00)
GFR calc Af Amer: 29 mL/min/{1.73_m2} — ABNORMAL LOW (ref >59)
GFR calc non Af Amer: 25 mL/min/{1.73_m2} — ABNORMAL LOW (ref >59)
GLUCOSE: 154 mg/dL — AB (ref 65–99)
Globulin, Total: 2 g/dL (ref 1.5–4.5)
POTASSIUM: 5.1 mmol/L (ref 3.5–5.2)
Sodium: 136 mmol/L (ref 134–144)
Total Bilirubin: 0.3 mg/dL (ref 0.0–1.2)
Total Protein: 6.4 g/dL (ref 6.0–8.5)

## 2013-09-12 ENCOUNTER — Other Ambulatory Visit: Payer: Self-pay | Admitting: Nurse Practitioner

## 2013-09-12 ENCOUNTER — Ambulatory Visit (INDEPENDENT_AMBULATORY_CARE_PROVIDER_SITE_OTHER): Payer: Medicare Other | Admitting: Nurse Practitioner

## 2013-09-12 ENCOUNTER — Encounter: Payer: Self-pay | Admitting: Nurse Practitioner

## 2013-09-12 VITALS — BP 119/68 | HR 69 | Temp 97.8°F | Ht 61.0 in | Wt 158.6 lb

## 2013-09-12 DIAGNOSIS — N289 Disorder of kidney and ureter, unspecified: Secondary | ICD-10-CM

## 2013-09-12 NOTE — Patient Instructions (Signed)
Chronic Kidney Disease Chronic kidney disease occurs when the kidneys are damaged over a long period. The kidneys are two organs that lie on either side of the spine between the middle of the back and the front of the abdomen. The kidneys:   Remove wastes and extra water from the blood.   Produce important hormones. These help keep bones strong, regulate blood pressure, and help create red blood cells.   Balance the fluids and chemicals in the blood and tissues. A small amount of kidney damage may not cause problems, but a large amount of damage may make it difficult or impossible for the kidneys to work the way they should. If steps are not taken to slow down the kidney damage or stop it from getting worse, the kidneys may stop working permanently. Most of the time, chronic kidney disease does not go away. However, it can often be controlled, and those with the disease can usually live normal lives. CAUSES  The most common causes of chronic kidney disease are diabetes and high blood pressure (hypertension). Chronic kidney disease may also be caused by:   Diseases that cause kidneys' filters to become inflamed.   Diseases that affect the immune system.   Genetic diseases.   Medicines that damage the kidneys, such as anti-inflammatory medicines.  Poisoning or exposure to toxic substances.   A reoccurring kidney or urinary infection.   A problem with urine flow. This may be caused by:   Cancer.   Kidney stones.   An enlarged prostate in males. SYMPTOMS  Because the kidney damage in chronic kidney disease occurs slowly, symptoms develop slowly and may not be obvious until the kidney damage becomes severe. A person may have a kidney disease for years without showing any symptoms. Symptoms can include:   Swelling (edema) of the legs, ankles, or feet.   Tiredness (lethargy).   Nausea or vomiting.   Confusion.   Problems with urination, such as:   Decreased urine  production.   Frequent urination, especially at night.   Frequent accidents in children who are potty trained.   Muscle twitches and cramps.   Shortness of breath.  Weakness.   Persistent itchiness.   Loss of appetite.  Metallic taste in the mouth.  Trouble sleeping.  Slowed development in children.  Short stature in children. DIAGNOSIS  Chronic kidney disease may be detected and diagnosed by tests, including blood, urine, imaging, or kidney biopsy tests.  TREATMENT  Most chronic kidney diseases cannot be cured. Treatment usually involves relieving symptoms and preventing or slowing the progression of the disease. Treatment may include:   A special diet. You may need to avoid alcohol and foods thatare salty and high in potassium.   Medicines. These may:   Lower blood pressure.   Relieve anemia.   Relieve swelling.   Protect the bones. HOME CARE INSTRUCTIONS   Follow your prescribed diet.   Only take over-the-counter or prescription medicines as directed by your caregiver.  Do not take any new medicines (prescription, over-the-counter, or nutritional supplements) unless approved by your caregiver. Many medicines can worsen your kidney damage or need to have the dose adjusted.   Quit smoking if you are a smoker. Talk to your caregiver about a smoking cessation program.   Keep all follow-up appointments as directed by your caregiver. SEEK IMMEDIATE MEDICAL CARE IF:  Your symptoms get worse or you develop new symptoms.   You develop symptoms of end-stage kidney disease. These include:   Headaches.  Abnormally dark or light skin.   Numbness in the hands or feet.   Easy bruising.   Frequent hiccups.   Menstruation stops.   You have a fever.   You have decreased urine production.   You havepain or bleeding when urinating. MAKE SURE YOU:  Understand these instructions.  Will watch your condition.  Will get help right  away if you are not doing well or get worse. FOR MORE INFORMATION  American Association of Kidney Patients: BombTimer.gl National Kidney Foundation: www.kidney.[place]: https://mathis.com/ [facility]: www.lifeoptions.org and www.kidneyschool.org Document Released: 12/25/2007 Document Revised: 03/03/2012 Document Reviewed: 11/14/2011 [facility] Patient Information 2014 [place], [place].

## 2013-09-12 NOTE — Progress Notes (Signed)
   Subjective:    Patient ID: Sonya Cook, female    DOB: 07/15/1945, 68 y.o.   MRN: [id number]  HPI patient has a history of renal insufficiency - SHe does not like who she is currently eeing- SHe wants to see Dr. Florene Glen [facility]. She is doing ell without complaints.    Review of Systems  Constitutional: Negative.   HENT: Negative.   Respiratory: Negative.   Cardiovascular: Negative.   Genitourinary: Negative.   Psychiatric/Behavioral: Negative.   All other systems reviewed and are negative.      Objective:   Physical Exam  Constitutional: She is oriented to person, place, and time. She appears well-developed and well-nourished.  Cardiovascular: Normal rate, regular rhythm and normal heart sounds.   Pulmonary/Chest: Effort normal and breath sounds normal.  Neurological: She is alert and oriented to person, place, and time.  Skin: Skin is warm.  Psychiatric: She has a normal mood and affect. Her behavior is normal. Judgment and thought content normal.    BP 119/68  Pulse 69  Temp(Src) 97.8 F (36.6 C) (Oral)  Ht 5\' 1"  (1.549 m)  Wt 158 lb 9.6 oz (71.94 kg)  BMI 29.98 kg/m2       Assessment & Plan:  1. Renal insufficiency avoid all NSAIDS - Ambulatory referral to Nephrology  Mary-Margaret Hassell Done, FNP

## 2013-09-14 ENCOUNTER — Telehealth: Payer: Self-pay | Admitting: Nurse Practitioner

## 2013-09-15 MED ORDER — SIMVASTATIN 40 MG PO TABS: ORAL_TABLET | ORAL | Status: DC

## 2013-09-15 NOTE — Telephone Encounter (Signed)
done

## 2013-10-06 ENCOUNTER — Other Ambulatory Visit: Payer: Self-pay | Admitting: Nurse Practitioner

## 2013-11-04 ENCOUNTER — Other Ambulatory Visit: Payer: Self-pay | Admitting: Nurse Practitioner

## 2013-11-21 ENCOUNTER — Ambulatory Visit: Payer: Medicare Other | Admitting: Nurse Practitioner

## 2014-02-06 ENCOUNTER — Other Ambulatory Visit: Payer: Self-pay | Admitting: Nurse Practitioner

## 2014-11-16 ENCOUNTER — Ambulatory Visit (INDEPENDENT_AMBULATORY_CARE_PROVIDER_SITE_OTHER): Payer: Medicare Other | Admitting: Internal Medicine

## 2014-11-16 ENCOUNTER — Encounter: Payer: Self-pay | Admitting: Internal Medicine

## 2014-11-16 VITALS — BP 126/64 | HR 64 | Ht 61.0 in | Wt 145.1 lb

## 2014-11-16 DIAGNOSIS — R1084 Generalized abdominal pain: Secondary | ICD-10-CM

## 2014-11-16 DIAGNOSIS — R938 Abnormal findings on diagnostic imaging of other specified body structures: Secondary | ICD-10-CM | POA: Diagnosis not present

## 2014-11-16 DIAGNOSIS — R9389 Abnormal findings on diagnostic imaging of other specified body structures: Secondary | ICD-10-CM

## 2014-11-16 DIAGNOSIS — K219 Gastro-esophageal reflux disease without esophagitis: Secondary | ICD-10-CM

## 2014-11-16 NOTE — Patient Instructions (Signed)
You have been scheduled for an endoscopy. Please follow written instructions given to you at your visit today. If you use inhalers (even only as needed), please bring them with you on the day of your procedure.   

## 2014-11-16 NOTE — Progress Notes (Signed)
HISTORY OF PRESENT ILLNESS:  Sonya Cook is a 69 y.o. female with past medical history as listed below. She is sent today by her primary care provider with chief complaint of abnormal CT scan of the abdomen requiring endoscopy for clarification. I have seen the patient previously for routine screening colonoscopy in October 2011 (normal) and most recently August 2014 to evaluate dyspepsia and epigastric pain. The examination revealed mild antral gastritis but was otherwise negative. She had been using significant NSAIDs for chronic back pain. Biopsy for Helicobacter pylori was negative (CLO testing). Patient does have chronic constipation related to her chronic pain medications for her back issues. For this she uses MiraLAX with success. She continues on daily PPI in the form of Zegerid for chronic GERD and GI mucosal protection. She tells me that she was in her usual state of health until 09/12/2014 when she presented to [facility] emergency room with explosive diarrhea associated with abdominal pain. Patient underwent a thorough evaluation including noncontrast CT scan of the abdomen and pelvis. Multiple nonacute findings as described. She was said to have soft tissue inflammation about the pylorus and first segment of the duodenum concerning for acute duodenitis. An underlying ulceration or mass cannot be excluded and endoscopy was felt to be helpful for further evaluation. Patient is accompanied today by her husband. She states that she felt better by the time she left the emergency room. She's had no recurrent problems since. She does report a 30 pound weight loss which she thinks is secondary to colonic offense lead and be placed on Victoza. Her only other GI complaints are belching  REVIEW OF SYSTEMS:  All non-GI ROS negative except for anxiety, depression, back pain  Past Medical History  Diagnosis Date  . Depression   . Osteopenia   . Diabetes mellitus without complication   .  Dyslipidemia   . Vitamin D deficiency   . Chronic kidney disease   . Hiatal hernia   . Hyperlipidemia   . GERD (gastroesophageal reflux disease)   . Internal hemorrhoids     Past Surgical History  Procedure Laterality Date  . Abdominal hysterectomy    . Lumbar disc surgery      Social History Sonya Cook  reports that she has quit smoking. Her smoking use included Cigarettes. She started smoking about 14 years ago. She has never used smokeless tobacco. She reports that she does not drink alcohol or use illicit drugs.  family history includes Cancer in her father; Hodgkin's lymphoma in her mother.  Allergies  Allergen Reactions  . Codeine     REACTION: hives  . Penicillins     REACTION: hives       PHYSICAL EXAMINATION: Vital signs: BP 126/64 mmHg  Pulse 64  Ht 5\' 1"  (1.549 m)  Wt 145 lb 2 oz (65.828 kg)  BMI 27.44 kg/m2 General: Well-developed, well-nourished, no acute distress HEENT: Sclerae are anicteric, conjunctiva pink. Oral mucosa intact Lungs: Clear Heart: Regular Abdomen: soft, nontender, nondistended, no obvious ascites, no peritoneal signs, normal bowel sounds. No organomegaly. Extremities: No clubbing cyanosis or edema Psychiatric: alert and oriented x3. Cooperative   ASSESSMENT:  #1. Abnormal CT scan finding of the stomach and duodenum as described. Needs clarification, particularly given weight loss (which may be due to medication certainly but upper GI pathology should be excluded) #2. History of GERD and CLO negative mild gastritis. On PPI #3. Negative screening colonoscopy 2011  PLAN:  #1. Schedule upper endoscopy.The nature of the procedure,  as well as the risks, benefits, and alternatives were carefully and thoroughly reviewed with the patient. Ample time for discussion and questions allowed. The patient understood, was satisfied, and agreed to proceed. #2. Continue PPI #3. Repeat screening colonoscopy 2021

## 2014-11-17 ENCOUNTER — Encounter: Payer: Self-pay | Admitting: Internal Medicine

## 2014-11-17 ENCOUNTER — Ambulatory Visit (AMBULATORY_SURGERY_CENTER): Payer: Medicare Other | Admitting: Internal Medicine

## 2014-11-17 VITALS — BP 101/54 | HR 64 | Temp 98.3°F | Resp 16 | Ht 61.0 in | Wt 145.0 lb

## 2014-11-17 DIAGNOSIS — R935 Abnormal findings on diagnostic imaging of other abdominal regions, including retroperitoneum: Secondary | ICD-10-CM

## 2014-11-17 DIAGNOSIS — R1084 Generalized abdominal pain: Secondary | ICD-10-CM

## 2014-11-17 DIAGNOSIS — R9389 Abnormal findings on diagnostic imaging of other specified body structures: Secondary | ICD-10-CM

## 2014-11-17 DIAGNOSIS — K219 Gastro-esophageal reflux disease without esophagitis: Secondary | ICD-10-CM

## 2014-11-17 LAB — GLUCOSE, CAPILLARY
GLUCOSE-CAPILLARY: 103 mg/dL — AB (ref 65–99)
GLUCOSE-CAPILLARY: 102 mg/dL — AB (ref 65–99)

## 2014-11-17 MED ORDER — SODIUM CHLORIDE 0.9 % IV SOLN: 500.0000 mL | INTRAVENOUS | Status: DC

## 2014-11-17 NOTE — Op Note (Signed)
[place]  [address] [place], [address]   ENDOSCOPY PROCEDURE REPORT  PATIENT: Arnesia, Vincelette  MR#: [id number] BIRTHDATE: September 20, 1945 , 69  yrs. old GENDER: female ENDOSCOPIST: Eustace Quail, MD REFERRED BY:  .  Self / Office PROCEDURE DATE:  11/17/2014 PROCEDURE:  EGD, diagnostic ASA CLASS:     Class II INDICATIONS:  abnormal CT of the GI tract and abdominal pain. MEDICATIONS: Monitored anesthesia care and Propofol 140 mg IV TOPICAL ANESTHETIC: none  DESCRIPTION OF PROCEDURE: After the risks benefits and alternatives of the procedure were thoroughly explained, informed consent was obtained.  The LB [id number] endoscope was introduced through the mouth and advanced to the second portion of the duodenum , Without limitations.  The instrument was slowly withdrawn as the mucosa was fully examined.    EXAM: The esophagus and gastroesophageal junction were completely normal in appearance.  The stomach was entered and closely examined.The antrum, angularis, and lesser curvature were well visualized, including a retroflexed view of the cardia and fundus. The stomach wall was normally distensable.  The scope passed easily through the pylorus into the duodenum.  Retroflexed views revealed no abnormalities.     The scope was then withdrawn from the patient and the procedure completed.  COMPLICATIONS: There were no immediate complications.  ENDOSCOPIC IMPRESSION: 1. Normal EGD 2. GERD  RECOMMENDATIONS: 1. Continue current medications 2. Return to the care of your primary provider.  REPEAT EXAM:  eSigned:  Eustace Quail, MD 11/17/2014 7:48 AM    CC:The Patient

## 2014-11-17 NOTE — Patient Instructions (Signed)
YOU HAD AN ENDOSCOPIC PROCEDURE TODAY AT THE [facility]:   Refer to the procedure report that was given to you for any specific questions about what was found during the examination.  If the procedure report does not answer your questions, please call your gastroenterologist to clarify.  If you requested that your care partner not be given the details of your procedure findings, then the procedure report has been included in a sealed envelope for you to review at your convenience later.  YOU SHOULD EXPECT: Some feelings of bloating in the abdomen. Passage of more gas than usual.  Walking can help get rid of the air that was put into your GI tract during the procedure and reduce the bloating. If you had a lower endoscopy (such as a colonoscopy or flexible sigmoidoscopy) you may notice spotting of blood in your stool or on the toilet paper. If you underwent a bowel prep for your procedure, you may not have a normal bowel movement for a few days.  Please Note:  You might notice some irritation and congestion in your nose or some drainage.  This is from the oxygen used during your procedure.  There is no need for concern and it should clear up in a day or so.  SYMPTOMS TO REPORT IMMEDIATELY:    Following upper endoscopy (EGD)  Vomiting of blood or coffee ground material  New chest pain or pain under the shoulder blades  Painful or persistently difficult swallowing  New shortness of breath  Fever of 100F or higher  Black, tarry-looking stools  For urgent or emergent issues, a gastroenterologist can be reached at any hour by calling [phone].   DIET: Your first meal following the procedure should be a small meal and then it is ok to progress to your normal diet. Heavy or fried foods are harder to digest and may make you feel nauseous or bloated.  Likewise, meals heavy in dairy and vegetables can increase bloating.  Drink plenty of fluids but you should avoid alcoholic beverages  for 24 hours.  ACTIVITY:  You should plan to take it easy for the rest of today and you should NOT DRIVE or use heavy machinery until tomorrow (because of the sedation medicines used during the test).    FOLLOW UP: Our staff will call the number listed on your records the next business day following your procedure to check on you and address any questions or concerns that you may have regarding the information given to you following your procedure. If we do not reach you, we will leave a message.  However, if you are feeling well and you are not experiencing any problems, there is no need to return our call.  We will assume that you have returned to your regular daily activities without incident.  If any biopsies were taken you will be contacted by phone or by letter within the next 1-3 weeks.  Please call us at [phone] if you have not heard about the biopsies in 3 weeks.    SIGNATURES/CONFIDENTIALITY: You and/or your care partner have signed paperwork which will be entered into your electronic medical record.  These signatures attest to the fact that that the information above on your After Visit Summary has been reviewed and is understood.  Full responsibility of the confidentiality of this discharge information lies with you and/or your care-partner. 

## 2014-11-17 NOTE — Progress Notes (Signed)
Transferred to recovery room. A/O x3, pleased with MAC.  VSS.  Report to Karen, RN. 

## 2014-11-20 ENCOUNTER — Telehealth: Payer: Self-pay | Admitting: *Deleted

## 2014-11-20 NOTE — Telephone Encounter (Signed)
Unable to leave message on f/u call. Received busy signal x4

## 2015-02-19 ENCOUNTER — Ambulatory Visit (INDEPENDENT_AMBULATORY_CARE_PROVIDER_SITE_OTHER): Payer: Medicare Other | Admitting: Family

## 2015-02-19 ENCOUNTER — Encounter: Payer: Self-pay | Admitting: Family

## 2015-02-19 VITALS — BP 132/75 | HR 72 | Temp 98.0°F | Ht 61.0 in | Wt 147.2 lb

## 2015-02-19 DIAGNOSIS — N898 Other specified noninflammatory disorders of vagina: Secondary | ICD-10-CM

## 2015-02-19 DIAGNOSIS — B373 Candidiasis of vulva and vagina: Secondary | ICD-10-CM | POA: Diagnosis not present

## 2015-02-19 DIAGNOSIS — B3731 Acute candidiasis of vulva and vagina: Secondary | ICD-10-CM

## 2015-02-19 DIAGNOSIS — L298 Other pruritus: Secondary | ICD-10-CM

## 2015-02-19 LAB — POCT WET PREP WITH KOH
Clue Cells Wet Prep HPF POC: NEGATIVE
RBC Wet Prep HPF POC: NEGATIVE
TRICHOMONAS UA: NEGATIVE

## 2015-02-19 MED ORDER — FLUCONAZOLE 150 MG PO TABS: 150.0000 mg | ORAL_TABLET | Freq: Once | ORAL | Status: DC

## 2015-02-19 MED ORDER — TERCONAZOLE 0.4 % VA CREA: 1.0000 | TOPICAL_CREAM | Freq: Every day | VAGINAL | Status: DC

## 2015-02-19 NOTE — Progress Notes (Signed)
   Subjective:    Patient ID: Sonya Cook, female    DOB: [birth year], 69 y.o.   MRN: [id number]  Pt presents to the office today with vaginal itching and a vaginal sore. Pt reports the the itching started over a month ago. Pt states she has tired antibiotic cream and her premarin cream with mild relief.  Pt states she has not been sexually active in years. Pt states her husband does have a history of "cold sores" and states she has had oral sex, but that was "years ago".  Vaginal Itching The patient's primary symptoms include genital itching, genital lesions and vaginal discharge. The patient's pertinent negatives include no genital odor, genital rash or vaginal bleeding. This is a recurrent problem. The current episode started 1 to 4 weeks ago. The problem occurs intermittently. The problem has been waxing and waning. The problem affects the left side. Associated symptoms include back pain ("chronic") and dysuria. Pertinent negatives include no chills, diarrhea, discolored urine, frequency, headaches, hematuria, nausea or urgency. There has been no bleeding. She has tried antifungals for the symptoms. The treatment provided mild relief. She is postmenopausal.      Review of Systems  Constitutional: Negative.  Negative for chills.  HENT: Negative.   Eyes: Negative.   Respiratory: Negative.  Negative for shortness of breath.   Cardiovascular: Negative.  Negative for palpitations.  Gastrointestinal: Negative.  Negative for nausea and diarrhea.  Endocrine: Negative.   Genitourinary: Positive for dysuria and vaginal discharge. Negative for urgency, frequency and hematuria.  Musculoskeletal: Positive for back pain ("chronic").  Neurological: Negative.  Negative for headaches.  Hematological: Negative.   Psychiatric/Behavioral: Negative.   All other systems reviewed and are negative.      Objective:   Physical Exam  Constitutional: She is oriented to person, place, and time. She appears  well-developed and well-nourished. No distress.  HENT:  Head: Normocephalic and atraumatic.  Eyes: Pupils are equal, round, and reactive to light.  Neck: Normal range of motion. Neck supple. No thyromegaly present.  Cardiovascular: Normal rate, regular rhythm, normal heart sounds and intact distal pulses.   No murmur heard. Pulmonary/Chest: Effort normal and breath sounds normal. No respiratory distress. She has no wheezes.  Abdominal: Soft. Bowel sounds are normal. She exhibits no distension. There is no tenderness.  Genitourinary: Vagina normal. There is labial fusion (right labia fusion). There is lesion (two blister on left lower labia) on the left labia.  Musculoskeletal: Normal range of motion. She exhibits no edema or tenderness.  Neurological: She is alert and oriented to person, place, and time. She has normal reflexes. No cranial nerve deficit.  Skin: Skin is warm and dry.  Psychiatric: She has a normal mood and affect. Her behavior is normal. Judgment and thought content normal.  Vitals reviewed.   BP 132/75 mmHg  Pulse 72  Temp(Src) 98 F (36.7 C) (Oral)  Ht 5\' 1"  (1.549 m)  Wt 147 lb 3.2 oz (66.769 kg)  BMI 27.83 kg/m2       Assessment & Plan:  1. Vagina itching - POCT Wet Prep with KOH  2. Vaginal lesion -Culture pending - Herpes simplex virus culture  3. Vagina, candidiasis -Keep clean and dry -Cotton underwear  - fluconazole (DIFLUCAN) 150 MG tablet; Take 1 tablet (150 mg total) by mouth once.  Dispense: 3 tablet; Refill: 0 - terconazole (TERAZOL 7) 0.4 % vaginal cream; Place 1 applicator vaginally at bedtime.  Dispense: 45 g; Refill: 0  Evelina Dun, FNP

## 2015-02-19 NOTE — Patient Instructions (Signed)

## 2015-02-25 LAB — HERPES SIMPLEX VIRUS CULTURE

## 2015-02-26 ENCOUNTER — Telehealth: Payer: Self-pay | Admitting: Family

## 2015-02-26 DIAGNOSIS — B373 Candidiasis of vulva and vagina: Secondary | ICD-10-CM

## 2015-02-26 DIAGNOSIS — B3731 Acute candidiasis of vulva and vagina: Secondary | ICD-10-CM

## 2015-02-26 MED ORDER — TERCONAZOLE 0.4 % VA CREA: 1.0000 | TOPICAL_CREAM | Freq: Every day | VAGINAL | Status: DC

## 2015-02-26 NOTE — Telephone Encounter (Signed)
Stp and advised her HSV culture was negative. Pt states she still has the sores but they aren't bleeding like they were. They are getting better and she finished using the cream last night. She has taken both of the diflucan and has the one left. What would you advise she do?

## 2015-02-26 NOTE — Telephone Encounter (Signed)
I refill the vaginal cream. Pt to continue with the premarin cream with the terconazole cream. I believe the "sore" is from friction and using the premarin cream will help get the tissue moist and healthier.

## 2015-02-26 NOTE — Telephone Encounter (Signed)
Patient aware of results.

## 2015-02-28 LAB — HEMOGLOBIN A1C: HEMOGLOBIN A1C: 5.8

## 2015-02-28 LAB — MICROALBUMIN, URINE: Microalb, Ur: 3.7

## 2015-03-20 ENCOUNTER — Encounter: Payer: Self-pay | Admitting: *Deleted

## 2015-03-20 LAB — HM DIABETES EYE EXAM

## 2015-04-12 DIAGNOSIS — R809 Proteinuria, unspecified: Secondary | ICD-10-CM | POA: Diagnosis not present

## 2015-04-12 DIAGNOSIS — D509 Iron deficiency anemia, unspecified: Secondary | ICD-10-CM | POA: Diagnosis not present

## 2015-04-12 DIAGNOSIS — Z79899 Other long term (current) drug therapy: Secondary | ICD-10-CM | POA: Diagnosis not present

## 2015-04-12 DIAGNOSIS — N183 Chronic kidney disease, stage 3 (moderate): Secondary | ICD-10-CM | POA: Diagnosis not present

## 2015-04-12 DIAGNOSIS — D519 Vitamin B12 deficiency anemia, unspecified: Secondary | ICD-10-CM | POA: Diagnosis not present

## 2015-04-12 DIAGNOSIS — E559 Vitamin D deficiency, unspecified: Secondary | ICD-10-CM | POA: Diagnosis not present

## 2015-04-12 DIAGNOSIS — I129 Hypertensive chronic kidney disease with stage 1 through stage 4 chronic kidney disease, or unspecified chronic kidney disease: Secondary | ICD-10-CM | POA: Diagnosis not present

## 2015-04-17 DIAGNOSIS — I10 Essential (primary) hypertension: Secondary | ICD-10-CM | POA: Diagnosis not present

## 2015-04-17 DIAGNOSIS — N183 Chronic kidney disease, stage 3 (moderate): Secondary | ICD-10-CM | POA: Diagnosis not present

## 2015-04-17 DIAGNOSIS — E871 Hypo-osmolality and hyponatremia: Secondary | ICD-10-CM | POA: Diagnosis not present

## 2015-04-17 DIAGNOSIS — E1129 Type 2 diabetes mellitus with other diabetic kidney complication: Secondary | ICD-10-CM | POA: Diagnosis not present

## 2015-05-17 DIAGNOSIS — M961 Postlaminectomy syndrome, not elsewhere classified: Secondary | ICD-10-CM | POA: Diagnosis not present

## 2015-05-17 DIAGNOSIS — M545 Low back pain: Secondary | ICD-10-CM | POA: Diagnosis not present

## 2015-05-17 DIAGNOSIS — M5417 Radiculopathy, lumbosacral region: Secondary | ICD-10-CM | POA: Diagnosis not present

## 2015-05-17 DIAGNOSIS — G894 Chronic pain syndrome: Secondary | ICD-10-CM | POA: Diagnosis not present

## 2015-05-17 DIAGNOSIS — M5137 Other intervertebral disc degeneration, lumbosacral region: Secondary | ICD-10-CM | POA: Diagnosis not present

## 2015-06-21 ENCOUNTER — Ambulatory Visit: Payer: Medicare Other | Admitting: Family

## 2015-07-18 ENCOUNTER — Encounter: Payer: Self-pay | Admitting: *Deleted

## 2015-08-01 DIAGNOSIS — G894 Chronic pain syndrome: Secondary | ICD-10-CM | POA: Diagnosis not present

## 2015-08-01 DIAGNOSIS — M545 Low back pain: Secondary | ICD-10-CM | POA: Diagnosis not present

## 2015-08-01 DIAGNOSIS — M961 Postlaminectomy syndrome, not elsewhere classified: Secondary | ICD-10-CM | POA: Diagnosis not present

## 2015-08-01 DIAGNOSIS — M5417 Radiculopathy, lumbosacral region: Secondary | ICD-10-CM | POA: Diagnosis not present

## 2015-08-15 DIAGNOSIS — E119 Type 2 diabetes mellitus without complications: Secondary | ICD-10-CM | POA: Diagnosis not present

## 2015-08-15 DIAGNOSIS — Z1159 Encounter for screening for other viral diseases: Secondary | ICD-10-CM | POA: Diagnosis not present

## 2015-08-15 DIAGNOSIS — I10 Essential (primary) hypertension: Secondary | ICD-10-CM | POA: Diagnosis not present

## 2015-08-20 DIAGNOSIS — N183 Chronic kidney disease, stage 3 unspecified: Secondary | ICD-10-CM | POA: Insufficient documentation

## 2015-08-20 DIAGNOSIS — M5136 Other intervertebral disc degeneration, lumbar region: Secondary | ICD-10-CM | POA: Diagnosis not present

## 2015-08-20 DIAGNOSIS — E1140 Type 2 diabetes mellitus with diabetic neuropathy, unspecified: Secondary | ICD-10-CM | POA: Diagnosis not present

## 2015-08-20 DIAGNOSIS — Z1159 Encounter for screening for other viral diseases: Secondary | ICD-10-CM | POA: Diagnosis not present

## 2015-08-20 DIAGNOSIS — Z780 Asymptomatic menopausal state: Secondary | ICD-10-CM | POA: Diagnosis not present

## 2015-11-19 DIAGNOSIS — M5137 Other intervertebral disc degeneration, lumbosacral region: Secondary | ICD-10-CM | POA: Diagnosis not present

## 2015-11-19 DIAGNOSIS — M545 Low back pain: Secondary | ICD-10-CM | POA: Diagnosis not present

## 2015-11-19 DIAGNOSIS — M961 Postlaminectomy syndrome, not elsewhere classified: Secondary | ICD-10-CM | POA: Diagnosis not present

## 2015-11-19 DIAGNOSIS — M5117 Intervertebral disc disorders with radiculopathy, lumbosacral region: Secondary | ICD-10-CM | POA: Diagnosis not present

## 2015-11-19 DIAGNOSIS — G894 Chronic pain syndrome: Secondary | ICD-10-CM | POA: Diagnosis not present

## 2015-12-11 DIAGNOSIS — E1140 Type 2 diabetes mellitus with diabetic neuropathy, unspecified: Secondary | ICD-10-CM | POA: Diagnosis not present

## 2015-12-18 DIAGNOSIS — I10 Essential (primary) hypertension: Secondary | ICD-10-CM | POA: Diagnosis not present

## 2015-12-18 DIAGNOSIS — E1140 Type 2 diabetes mellitus with diabetic neuropathy, unspecified: Secondary | ICD-10-CM | POA: Diagnosis not present

## 2015-12-18 DIAGNOSIS — N183 Chronic kidney disease, stage 3 (moderate): Secondary | ICD-10-CM | POA: Diagnosis not present

## 2015-12-18 DIAGNOSIS — E785 Hyperlipidemia, unspecified: Secondary | ICD-10-CM | POA: Diagnosis not present

## 2015-12-18 DIAGNOSIS — E1169 Type 2 diabetes mellitus with other specified complication: Secondary | ICD-10-CM | POA: Diagnosis not present

## 2015-12-18 DIAGNOSIS — Z23 Encounter for immunization: Secondary | ICD-10-CM | POA: Diagnosis not present

## 2015-12-18 DIAGNOSIS — M5136 Other intervertebral disc degeneration, lumbar region: Secondary | ICD-10-CM | POA: Diagnosis not present

## 2015-12-25 DIAGNOSIS — M5417 Radiculopathy, lumbosacral region: Secondary | ICD-10-CM | POA: Diagnosis not present

## 2015-12-25 DIAGNOSIS — M545 Low back pain: Secondary | ICD-10-CM | POA: Diagnosis not present

## 2015-12-25 DIAGNOSIS — M6088 Other myositis, other site: Secondary | ICD-10-CM | POA: Diagnosis not present

## 2015-12-25 DIAGNOSIS — M792 Neuralgia and neuritis, unspecified: Secondary | ICD-10-CM | POA: Diagnosis not present

## 2015-12-25 DIAGNOSIS — M961 Postlaminectomy syndrome, not elsewhere classified: Secondary | ICD-10-CM | POA: Diagnosis not present

## 2015-12-25 DIAGNOSIS — M5137 Other intervertebral disc degeneration, lumbosacral region: Secondary | ICD-10-CM | POA: Diagnosis not present

## 2015-12-25 DIAGNOSIS — G894 Chronic pain syndrome: Secondary | ICD-10-CM | POA: Diagnosis not present

## 2016-02-04 DIAGNOSIS — M5417 Radiculopathy, lumbosacral region: Secondary | ICD-10-CM | POA: Diagnosis not present

## 2016-02-04 DIAGNOSIS — M6088 Other myositis, other site: Secondary | ICD-10-CM | POA: Diagnosis not present

## 2016-02-04 DIAGNOSIS — M792 Neuralgia and neuritis, unspecified: Secondary | ICD-10-CM | POA: Diagnosis not present

## 2016-02-04 DIAGNOSIS — M609 Myositis, unspecified: Secondary | ICD-10-CM | POA: Diagnosis not present

## 2016-02-04 DIAGNOSIS — G894 Chronic pain syndrome: Secondary | ICD-10-CM | POA: Diagnosis not present

## 2016-02-04 DIAGNOSIS — M961 Postlaminectomy syndrome, not elsewhere classified: Secondary | ICD-10-CM | POA: Diagnosis not present

## 2016-02-04 DIAGNOSIS — M545 Low back pain: Secondary | ICD-10-CM | POA: Diagnosis not present

## 2016-03-18 DIAGNOSIS — H40033 Anatomical narrow angle, bilateral: Secondary | ICD-10-CM | POA: Diagnosis not present

## 2016-03-18 DIAGNOSIS — E119 Type 2 diabetes mellitus without complications: Secondary | ICD-10-CM | POA: Diagnosis not present

## 2016-04-09 DIAGNOSIS — E871 Hypo-osmolality and hyponatremia: Secondary | ICD-10-CM | POA: Diagnosis not present

## 2016-04-09 DIAGNOSIS — N183 Chronic kidney disease, stage 3 (moderate): Secondary | ICD-10-CM | POA: Diagnosis not present

## 2016-04-09 DIAGNOSIS — Z79899 Other long term (current) drug therapy: Secondary | ICD-10-CM | POA: Diagnosis not present

## 2016-04-09 DIAGNOSIS — E1122 Type 2 diabetes mellitus with diabetic chronic kidney disease: Secondary | ICD-10-CM | POA: Diagnosis not present

## 2016-04-09 DIAGNOSIS — I129 Hypertensive chronic kidney disease with stage 1 through stage 4 chronic kidney disease, or unspecified chronic kidney disease: Secondary | ICD-10-CM | POA: Diagnosis not present

## 2016-04-09 DIAGNOSIS — D649 Anemia, unspecified: Secondary | ICD-10-CM | POA: Diagnosis not present

## 2016-04-15 DIAGNOSIS — E871 Hypo-osmolality and hyponatremia: Secondary | ICD-10-CM | POA: Diagnosis not present

## 2016-04-15 DIAGNOSIS — E663 Overweight: Secondary | ICD-10-CM | POA: Diagnosis not present

## 2016-04-15 DIAGNOSIS — N189 Chronic kidney disease, unspecified: Secondary | ICD-10-CM | POA: Diagnosis not present

## 2016-04-15 DIAGNOSIS — R5383 Other fatigue: Secondary | ICD-10-CM | POA: Diagnosis not present

## 2016-04-15 DIAGNOSIS — E038 Other specified hypothyroidism: Secondary | ICD-10-CM | POA: Diagnosis not present

## 2016-04-15 DIAGNOSIS — I10 Essential (primary) hypertension: Secondary | ICD-10-CM | POA: Diagnosis not present

## 2016-05-27 DIAGNOSIS — E349 Endocrine disorder, unspecified: Secondary | ICD-10-CM | POA: Diagnosis not present

## 2016-05-27 DIAGNOSIS — E0789 Other specified disorders of thyroid: Secondary | ICD-10-CM | POA: Diagnosis not present

## 2016-05-27 DIAGNOSIS — N951 Menopausal and female climacteric states: Secondary | ICD-10-CM | POA: Diagnosis not present

## 2016-05-27 DIAGNOSIS — E538 Deficiency of other specified B group vitamins: Secondary | ICD-10-CM | POA: Diagnosis not present

## 2016-05-29 DIAGNOSIS — N951 Menopausal and female climacteric states: Secondary | ICD-10-CM | POA: Diagnosis not present

## 2016-05-29 DIAGNOSIS — E349 Endocrine disorder, unspecified: Secondary | ICD-10-CM | POA: Diagnosis not present

## 2016-05-29 DIAGNOSIS — E0789 Other specified disorders of thyroid: Secondary | ICD-10-CM | POA: Diagnosis not present

## 2016-06-04 DIAGNOSIS — E2839 Other primary ovarian failure: Secondary | ICD-10-CM | POA: Diagnosis not present

## 2016-06-04 DIAGNOSIS — E663 Overweight: Secondary | ICD-10-CM | POA: Diagnosis not present

## 2016-06-04 DIAGNOSIS — E2749 Other adrenocortical insufficiency: Secondary | ICD-10-CM | POA: Diagnosis not present

## 2016-06-04 DIAGNOSIS — E038 Other specified hypothyroidism: Secondary | ICD-10-CM | POA: Diagnosis not present

## 2016-06-11 DIAGNOSIS — Z20828 Contact with and (suspected) exposure to other viral communicable diseases: Secondary | ICD-10-CM | POA: Diagnosis not present

## 2016-06-11 DIAGNOSIS — J029 Acute pharyngitis, unspecified: Secondary | ICD-10-CM | POA: Diagnosis not present

## 2016-06-17 ENCOUNTER — Ambulatory Visit (INDEPENDENT_AMBULATORY_CARE_PROVIDER_SITE_OTHER): Payer: Medicare Other

## 2016-06-17 ENCOUNTER — Ambulatory Visit (INDEPENDENT_AMBULATORY_CARE_PROVIDER_SITE_OTHER): Payer: Medicare Other | Admitting: Family

## 2016-06-17 ENCOUNTER — Encounter: Payer: Self-pay | Admitting: Family

## 2016-06-17 VITALS — BP 162/82 | HR 60 | Temp 97.3°F | Ht 61.0 in | Wt 148.4 lb

## 2016-06-17 DIAGNOSIS — K219 Gastro-esophageal reflux disease without esophagitis: Secondary | ICD-10-CM | POA: Diagnosis not present

## 2016-06-17 DIAGNOSIS — Z780 Asymptomatic menopausal state: Secondary | ICD-10-CM

## 2016-06-17 DIAGNOSIS — E1165 Type 2 diabetes mellitus with hyperglycemia: Secondary | ICD-10-CM

## 2016-06-17 DIAGNOSIS — M8580 Other specified disorders of bone density and structure, unspecified site: Secondary | ICD-10-CM | POA: Diagnosis not present

## 2016-06-17 DIAGNOSIS — Z23 Encounter for immunization: Secondary | ICD-10-CM

## 2016-06-17 DIAGNOSIS — F331 Major depressive disorder, recurrent, moderate: Secondary | ICD-10-CM

## 2016-06-17 DIAGNOSIS — N289 Disorder of kidney and ureter, unspecified: Secondary | ICD-10-CM

## 2016-06-17 DIAGNOSIS — Z1211 Encounter for screening for malignant neoplasm of colon: Secondary | ICD-10-CM

## 2016-06-17 DIAGNOSIS — Z1159 Encounter for screening for other viral diseases: Secondary | ICD-10-CM | POA: Diagnosis not present

## 2016-06-17 DIAGNOSIS — E785 Hyperlipidemia, unspecified: Secondary | ICD-10-CM | POA: Diagnosis not present

## 2016-06-17 LAB — BAYER DCA HB A1C WAIVED: HB A1C (BAYER DCA - WAIVED): 5.9 % (ref <7.0)

## 2016-06-17 MED ORDER — BENZONATATE 200 MG PO CAPS: 200.0000 mg | ORAL_CAPSULE | Freq: Three times a day (TID) | ORAL | 2 refills | Status: DC | PRN

## 2016-06-17 NOTE — Addendum Note (Signed)
Addended by: Shelbie Ammons on: 06/17/2016 02:36 PM   Modules accepted: Orders

## 2016-06-17 NOTE — Progress Notes (Signed)
Subjective:    Patient ID: Sonya Cook, female    DOB: Apr 19, 1945, 71 y.o.   MRN: [id number]  PT presents to the office today for chronic follow up. Pt was seeing a PCP in [place], but now wants to switch a provider closer to home. PT is followed by Pain Management every 3 months for chronic back pain. PT is followed by nephrologists every 6 months.  Diabetes  She presents for her follow-up diabetic visit. She has type 2 diabetes mellitus. Her disease course has been stable. Hypoglycemia symptoms include nervousness/anxiousness. Pertinent negatives for hypoglycemia include no headaches. There are no diabetic associated symptoms. Pertinent negatives for diabetes include no chest pain, no foot paresthesias, no foot ulcerations and no weight loss. There are no hypoglycemic complications. Symptoms are stable. Diabetic complications include nephropathy. Pertinent negatives for diabetic complications include no CVA or heart disease. Risk factors for coronary artery disease include diabetes mellitus, dyslipidemia, hypertension, obesity, post-menopausal and sedentary lifestyle. Current diabetic treatment includes oral agent (dual therapy). She is following a generally healthy diet. Her breakfast blood glucose range is generally 110-130 mg/dl. An ACE inhibitor/angiotensin II receptor blocker is being taken. Eye exam is current.  Hyperlipidemia  This is a chronic problem. The current episode started more than 1 year ago. The problem is controlled. Recent lipid tests were reviewed and are normal. Exacerbating diseases include obesity. Pertinent negatives include no chest pain or shortness of breath. Current antihyperlipidemic treatment includes statins. The current treatment provides moderate improvement of lipids.  Hypertension  This is a chronic problem. The current episode started more than 1 year ago. The problem has been waxing and waning since onset. The problem is uncontrolled. Associated symptoms include  anxiety. Pertinent negatives include no chest pain, headaches, palpitations, peripheral edema or shortness of breath. Risk factors for coronary artery disease include diabetes mellitus, dyslipidemia, female gender, obesity and sedentary lifestyle. Past treatments include ACE inhibitors. The current treatment provides mild improvement. There is no history of CVA.  Gastroesophageal Reflux  She reports no chest pain, no dysphagia or no heartburn. This is a chronic problem. The current episode started more than 1 year ago. The problem occurs rarely. The problem has been resolved. Pertinent negatives include no weight loss. Risk factors include obesity. She has tried a PPI for the symptoms. The treatment provided moderate relief.  Anxiety  Presents for follow-up visit. Symptoms include depressed mood, excessive worry, irritability and nervous/anxious behavior. Patient reports no chest pain, palpitations, shortness of breath or suicidal ideas. Symptoms occur occasionally. The severity of symptoms is moderate.    Osteopenia     Review of Systems  Constitutional: Positive for irritability. Negative for weight loss.  Respiratory: Negative for shortness of breath.   Cardiovascular: Negative for chest pain and palpitations.  Gastrointestinal: Negative for dysphagia and heartburn.  Neurological: Negative for headaches.  Psychiatric/Behavioral: Negative for suicidal ideas. The patient is nervous/anxious.   All other systems reviewed and are negative.      Objective:   Physical Exam  Constitutional: She is oriented to person, place, and time. She appears well-developed and well-nourished. No distress.  HENT:  Head: Normocephalic and atraumatic.  Right Ear: External ear normal.  Left Ear: External ear normal.  Nose: Nose normal.  Mouth/Throat: Oropharynx is clear and moist.  Eyes: Pupils are equal, round, and reactive to light.  Neck: Normal range of motion. Neck supple. No thyromegaly present.    Cardiovascular: Normal rate, regular rhythm, normal heart sounds and intact distal pulses.  No murmur heard. Pulmonary/Chest: Effort normal and breath sounds normal. No respiratory distress. She has no wheezes.  Abdominal: Soft. Bowel sounds are normal. She exhibits no distension. There is no tenderness.  Musculoskeletal: Normal range of motion. She exhibits no edema or tenderness.  Neurological: She is alert and oriented to person, place, and time.  Skin: Skin is warm and dry.  Psychiatric: She has a normal mood and affect. Her behavior is normal. Judgment and thought content normal.  Vitals reviewed.   Diabetic Foot Exam - Simple   Simple Foot Form Diabetic Foot exam was performed with the following findings:  Yes 06/17/2016 11:41 AM  Visual Inspection No deformities, no ulcerations, no other skin breakdown bilaterally:  Yes Sensation Testing Intact to touch and monofilament testing bilaterally:  Yes Pulse Check Posterior Tibialis and Dorsalis pulse intact bilaterally:  Yes Comments      BP (!) 162/82   Pulse 60   Temp 97.3 F (36.3 C) (Oral)   Ht 5' 1"  (1.549 m)   Wt 148 lb 6.4 oz (67.3 kg)   BMI 28.04 kg/m      Assessment & Plan:  1. Gastroesophageal reflux disease, esophagitis presence not specified - CMP14+EGFR  2. Moderate episode of recurrent major depressive disorder (HCC) - CMP14+EGFR  3. Hyperlipidemia, unspecified hyperlipidemia type - CMP14+EGFR - Lipid panel  4. Renal insufficiency - CMP14+EGFR  5. Osteopenia, unspecified location - CMP14+EGFR  6. Type 2 diabetes mellitus with hyperglycemia, without long-term current use of insulin (HCC) - CMP14+EGFR - Microalbumin / creatinine urine ratio - Bayer DCA Hb A1c Waived  7. Post-menopausal - CMP14+EGFR - DG WRFM DEXA  8. Colon cancer screening - Fecal occult blood, imunochemical; Future   Continue all meds Labs pending Health Maintenance reviewed- Prevnar  Diet and exercise  encouraged RTO 3 months   Evelina Dun, FNP

## 2016-06-17 NOTE — Patient Instructions (Signed)
Health Maintenance, Female Adopting a healthy lifestyle and getting preventive care can go a long way to promote health and wellness. Talk with your health care provider about what schedule of regular examinations is right for you. This is a good chance for you to check in with your provider about disease prevention and staying healthy. In between checkups, there are plenty of things you can do on your own. Experts have done a lot of research about which lifestyle changes and preventive measures are most likely to keep you healthy. Ask your health care provider for more information. Weight and diet Eat a healthy diet  Be sure to include plenty of vegetables, fruits, low-fat dairy products, and lean protein.  Do not eat a lot of foods high in solid fats, added sugars, or salt.  Get regular exercise. This is one of the most important things you can do for your health.  Most adults should exercise for at least 150 minutes each week. The exercise should increase your heart rate and make you sweat (moderate-intensity exercise).  Most adults should also do strengthening exercises at least twice a week. This is in addition to the moderate-intensity exercise. Maintain a healthy weight  Body mass index (BMI) is a measurement that can be used to identify possible weight problems. It estimates body fat based on height and weight. Your health care provider can help determine your BMI and help you achieve or maintain a healthy weight.  For females 76 years of age and older:  A BMI below 18.5 is considered underweight.  A BMI of 18.5 to 24.9 is normal.  A BMI of 25 to 29.9 is considered overweight.  A BMI of 30 and above is considered obese. Watch levels of cholesterol and blood lipids  You should start having your blood tested for lipids and cholesterol at 70 years of age, then have this test every 5 years.  You may need to have your cholesterol levels checked more often if:  Your lipid or  cholesterol levels are high.  You are older than 70 years of age.  You are at high risk for heart disease. Cancer screening Lung Cancer  Lung cancer screening is recommended for adults 64-42 years old who are at high risk for lung cancer because of a history of smoking.  A yearly low-dose CT scan of the lungs is recommended for people who:  Currently smoke.  Have quit within the past 15 years.  Have at least a 30-pack-year history of smoking. A pack year is smoking an average of one pack of cigarettes a day for 1 year.  Yearly screening should continue until it has been 15 years since you quit.  Yearly screening should stop if you develop a health problem that would prevent you from having lung cancer treatment. Breast Cancer  Practice breast self-awareness. This means understanding how your breasts normally appear and feel.  It also means doing regular breast self-exams. Let your health care provider know about any changes, no matter how small.  If you are in your 20s or 30s, you should have a clinical breast exam (CBE) by a health care provider every 1-3 years as part of a regular health exam.  If you are 34 or older, have a CBE every year. Also consider having a breast X-ray (mammogram) every year.  If you have a family history of breast cancer, talk to your health care provider about genetic screening.  If you are at high risk for breast cancer, talk  to your health care provider about having an MRI and a mammogram every year.  Breast cancer gene (BRCA) assessment is recommended for women who have family members with BRCA-related cancers. BRCA-related cancers include:  Breast.  Ovarian.  Tubal.  Peritoneal cancers.  Results of the assessment will determine the need for genetic counseling and BRCA1 and BRCA2 testing. Cervical Cancer  Your health care provider may recommend that you be screened regularly for cancer of the pelvic organs (ovaries, uterus, and vagina).  This screening involves a pelvic examination, including checking for microscopic changes to the surface of your cervix (Pap test). You may be encouraged to have this screening done every 3 years, beginning at age 24.  For women ages 66-65, health care providers may recommend pelvic exams and Pap testing every 3 years, or they may recommend the Pap and pelvic exam, combined with testing for human papilloma virus (HPV), every 5 years. Some types of HPV increase your risk of cervical cancer. Testing for HPV may also be done on women of any age with unclear Pap test results.  Other health care providers may not recommend any screening for nonpregnant women who are considered low risk for pelvic cancer and who do not have symptoms. Ask your health care provider if a screening pelvic exam is right for you.  If you have had past treatment for cervical cancer or a condition that could lead to cancer, you need Pap tests and screening for cancer for at least 20 years after your treatment. If Pap tests have been discontinued, your risk factors (such as having a new sexual partner) need to be reassessed to determine if screening should resume. Some women have medical problems that increase the chance of getting cervical cancer. In these cases, your health care provider may recommend more frequent screening and Pap tests. Colorectal Cancer  This type of cancer can be detected and often prevented.  Routine colorectal cancer screening usually begins at 70 years of age and continues through 70 years of age.  Your health care provider may recommend screening at an earlier age if you have risk factors for colon cancer.  Your health care provider may also recommend using home test kits to check for hidden blood in the stool.  A small camera at the end of a tube can be used to examine your colon directly (sigmoidoscopy or colonoscopy). This is done to check for the earliest forms of colorectal cancer.  Routine  screening usually begins at age 41.  Direct examination of the colon should be repeated every 5-10 years through 70 years of age. However, you may need to be screened more often if early forms of precancerous polyps or small growths are found. Skin Cancer  Check your skin from head to toe regularly.  Tell your health care provider about any new moles or changes in moles, especially if there is a change in a mole's shape or color.  Also tell your health care provider if you have a mole that is larger than the size of a pencil eraser.  Always use sunscreen. Apply sunscreen liberally and repeatedly throughout the day.  Protect yourself by wearing long sleeves, pants, a wide-brimmed hat, and sunglasses whenever you are outside. Heart disease, diabetes, and high blood pressure  High blood pressure causes heart disease and increases the risk of stroke. High blood pressure is more likely to develop in:  People who have blood pressure in the high end of the normal range (130-139/85-89 mm Hg).  People who are overweight or obese.  People who are African American.  If you are 59-24 years of age, have your blood pressure checked every 3-5 years. If you are 34 years of age or older, have your blood pressure checked every year. You should have your blood pressure measured twice-once when you are at a hospital or clinic, and once when you are not at a hospital or clinic. Record the average of the two measurements. To check your blood pressure when you are not at a hospital or clinic, you can use:  An automated blood pressure machine at a pharmacy.  A home blood pressure monitor.  If you are between 29 years and 60 years old, ask your health care provider if you should take aspirin to prevent strokes.  Have regular diabetes screenings. This involves taking a blood sample to check your fasting blood sugar level.  If you are at a normal weight and have a low risk for diabetes, have this test once  every three years after 70 years of age.  If you are overweight and have a high risk for diabetes, consider being tested at a younger age or more often. Preventing infection Hepatitis B  If you have a higher risk for hepatitis B, you should be screened for this virus. You are considered at high risk for hepatitis B if:  You were born in a country where hepatitis B is common. Ask your health care provider which countries are considered high risk.  Your parents were born in a high-risk country, and you have not been immunized against hepatitis B (hepatitis B vaccine).  You have HIV or AIDS.  You use needles to inject street drugs.  You live with someone who has hepatitis B.  You have had sex with someone who has hepatitis B.  You get hemodialysis treatment.  You take certain medicines for conditions, including cancer, organ transplantation, and autoimmune conditions. Hepatitis C  Blood testing is recommended for:  Everyone born from [birth year] through 1964.  Anyone with known risk factors for hepatitis C. Sexually transmitted infections (STIs)  You should be screened for sexually transmitted infections (STIs) including gonorrhea and chlamydia if:  You are sexually active and are younger than 70 years of age.  You are older than 70 years of age and your health care provider tells you that you are at risk for this type of infection.  Your sexual activity has changed since you were last screened and you are at an increased risk for chlamydia or gonorrhea. Ask your health care provider if you are at risk.  If you do not have HIV, but are at risk, it may be recommended that you take a prescription medicine daily to prevent HIV infection. This is called pre-exposure prophylaxis (PrEP). You are considered at risk if:  You are sexually active and do not regularly use condoms or know the HIV status of your partner(s).  You take drugs by injection.  You are sexually active with a partner  who has HIV. Talk with your health care provider about whether you are at high risk of being infected with HIV. If you choose to begin PrEP, you should first be tested for HIV. You should then be tested every 3 months for as long as you are taking PrEP. Pregnancy  If you are premenopausal and you may become pregnant, ask your health care provider about preconception counseling.  If you may become pregnant, take 400 to 800 micrograms (mcg) of folic acid  every day.  If you want to prevent pregnancy, talk to your health care provider about birth control (contraception). Osteoporosis and menopause  Osteoporosis is a disease in which the bones lose minerals and strength with aging. This can result in serious bone fractures. Your risk for osteoporosis can be identified using a bone density scan.  If you are 4 years of age or older, or if you are at risk for osteoporosis and fractures, ask your health care provider if you should be screened.  Ask your health care provider whether you should take a calcium or vitamin D supplement to lower your risk for osteoporosis.  Menopause may have certain physical symptoms and risks.  Hormone replacement therapy may reduce some of these symptoms and risks. Talk to your health care provider about whether hormone replacement therapy is right for you. Follow these instructions at home:  Schedule regular health, dental, and eye exams.  Stay current with your immunizations.  Do not use any tobacco products including cigarettes, chewing tobacco, or electronic cigarettes.  If you are pregnant, do not drink alcohol.  If you are breastfeeding, limit how much and how often you drink alcohol.  Limit alcohol intake to no more than 1 drink per day for nonpregnant women. One drink equals 12 ounces of beer, 5 ounces of wine, or 1 ounces of hard liquor.  Do not use street drugs.  Do not share needles.  Ask your health care provider for help if you need support  or information about quitting drugs.  Tell your health care provider if you often feel depressed.  Tell your health care provider if you have ever been abused or do not feel safe at home. This information is not intended to replace advice given to you by your health care provider. Make sure you discuss any questions you have with your health care provider. Document Released: 09/30/2010 Document Revised: 08/23/2015 Document Reviewed: 12/19/2014 Elsevier Interactive Patient Education  2017 [employer].

## 2016-06-18 LAB — CMP14+EGFR
A/G RATIO: 1.7 (ref 1.2–2.2)
ALT: 13 IU/L (ref 0–32)
AST: 21 IU/L (ref 0–40)
Albumin: 4.3 g/dL (ref 3.6–4.8)
Alkaline Phosphatase: 56 IU/L (ref 39–117)
BILIRUBIN TOTAL: 0.3 mg/dL (ref 0.0–1.2)
BUN/Creatinine Ratio: 11 — ABNORMAL LOW (ref 12–28)
BUN: 11 mg/dL (ref 8–27)
CHLORIDE: 99 mmol/L (ref 96–106)
CO2: 27 mmol/L (ref 18–29)
Calcium: 9.3 mg/dL (ref 8.7–10.3)
Creatinine, Ser: 0.98 mg/dL (ref 0.57–1.00)
GFR, EST AFRICAN AMERICAN: 68 mL/min/{1.73_m2} (ref >59)
GFR, EST NON AFRICAN AMERICAN: 59 mL/min/{1.73_m2} — AB (ref >59)
GLOBULIN, TOTAL: 2.5 g/dL (ref 1.5–4.5)
Glucose: 85 mg/dL (ref 65–99)
POTASSIUM: 3.6 mmol/L (ref 3.5–5.2)
Sodium: 142 mmol/L (ref 134–144)
TOTAL PROTEIN: 6.8 g/dL (ref 6.0–8.5)

## 2016-06-18 LAB — MICROALBUMIN / CREATININE URINE RATIO
CREATININE, UR: 355.3 mg/dL
Microalb/Creat Ratio: 12.7 mg/g creat (ref 0.0–30.0)
Microalbumin, Urine: 45.1 ug/mL

## 2016-06-18 LAB — LIPID PANEL
CHOL/HDL RATIO: 5.2 ratio — AB (ref 0.0–4.4)
Cholesterol, Total: 173 mg/dL (ref 100–199)
HDL: 33 mg/dL — ABNORMAL LOW (ref >39)
LDL CALC: 98 mg/dL (ref 0–99)
Triglycerides: 209 mg/dL — ABNORMAL HIGH (ref 0–149)
VLDL Cholesterol Cal: 42 mg/dL — ABNORMAL HIGH (ref 5–40)

## 2016-06-18 LAB — HEPATITIS C ANTIBODY: Hep C Virus Ab: <0.1 s/co ratio (ref 0.0–0.9)

## 2016-08-26 ENCOUNTER — Other Ambulatory Visit: Payer: Self-pay | Admitting: Family

## 2016-08-29 DIAGNOSIS — E2839 Other primary ovarian failure: Secondary | ICD-10-CM | POA: Diagnosis not present

## 2016-08-29 DIAGNOSIS — E2749 Other adrenocortical insufficiency: Secondary | ICD-10-CM | POA: Diagnosis not present

## 2016-08-29 DIAGNOSIS — E038 Other specified hypothyroidism: Secondary | ICD-10-CM | POA: Diagnosis not present

## 2016-09-30 ENCOUNTER — Ambulatory Visit: Payer: Medicare Other | Admitting: Family

## 2016-10-20 ENCOUNTER — Other Ambulatory Visit: Payer: Self-pay | Admitting: *Deleted

## 2016-10-20 MED ORDER — LIRAGLUTIDE 18 MG/3ML ~~LOC~~ SOPN: PEN_INJECTOR | SUBCUTANEOUS | 0 refills | Status: DC

## 2016-10-24 ENCOUNTER — Other Ambulatory Visit: Payer: Self-pay | Admitting: Family

## 2016-11-11 DIAGNOSIS — D519 Vitamin B12 deficiency anemia, unspecified: Secondary | ICD-10-CM | POA: Diagnosis not present

## 2016-11-11 DIAGNOSIS — D509 Iron deficiency anemia, unspecified: Secondary | ICD-10-CM | POA: Diagnosis not present

## 2016-11-11 DIAGNOSIS — I129 Hypertensive chronic kidney disease with stage 1 through stage 4 chronic kidney disease, or unspecified chronic kidney disease: Secondary | ICD-10-CM | POA: Diagnosis not present

## 2016-11-11 DIAGNOSIS — E559 Vitamin D deficiency, unspecified: Secondary | ICD-10-CM | POA: Diagnosis not present

## 2016-11-11 DIAGNOSIS — Z79899 Other long term (current) drug therapy: Secondary | ICD-10-CM | POA: Diagnosis not present

## 2016-11-11 DIAGNOSIS — R809 Proteinuria, unspecified: Secondary | ICD-10-CM | POA: Diagnosis not present

## 2016-11-11 DIAGNOSIS — N183 Chronic kidney disease, stage 3 (moderate): Secondary | ICD-10-CM | POA: Diagnosis not present

## 2016-11-13 ENCOUNTER — Ambulatory Visit (INDEPENDENT_AMBULATORY_CARE_PROVIDER_SITE_OTHER): Payer: Medicare Other | Admitting: Family

## 2016-11-13 ENCOUNTER — Encounter: Payer: Self-pay | Admitting: Family

## 2016-11-13 VITALS — BP 143/75 | HR 61 | Temp 97.4°F | Ht 61.0 in | Wt 153.4 lb

## 2016-11-13 DIAGNOSIS — E785 Hyperlipidemia, unspecified: Secondary | ICD-10-CM

## 2016-11-13 DIAGNOSIS — G8929 Other chronic pain: Secondary | ICD-10-CM | POA: Diagnosis not present

## 2016-11-13 DIAGNOSIS — M8580 Other specified disorders of bone density and structure, unspecified site: Secondary | ICD-10-CM

## 2016-11-13 DIAGNOSIS — E1165 Type 2 diabetes mellitus with hyperglycemia: Secondary | ICD-10-CM

## 2016-11-13 DIAGNOSIS — M545 Low back pain: Secondary | ICD-10-CM

## 2016-11-13 DIAGNOSIS — M549 Dorsalgia, unspecified: Secondary | ICD-10-CM

## 2016-11-13 DIAGNOSIS — F331 Major depressive disorder, recurrent, moderate: Secondary | ICD-10-CM | POA: Diagnosis not present

## 2016-11-13 DIAGNOSIS — K219 Gastro-esophageal reflux disease without esophagitis: Secondary | ICD-10-CM | POA: Diagnosis not present

## 2016-11-13 LAB — BAYER DCA HB A1C WAIVED: HB A1C (BAYER DCA - WAIVED): 5.6 % (ref <7.0)

## 2016-11-13 NOTE — Progress Notes (Signed)
Subjective:    Patient ID: Sonya Cook, female    DOB: 07-23-1945, 71 y.o.   MRN: [id number]  PT presents to the office today for chronic follow up. PT is followed by Pain Management every 3 months for chronic back pain. PT is followed by nephrologists every 6 months.  Diabetes  She presents for her follow-up diabetic visit. She has type 2 diabetes mellitus. Her disease course has been stable. There are no hypoglycemic associated symptoms. There are no diabetic associated symptoms. Pertinent negatives for diabetes include no blurred vision, no foot paresthesias and no foot ulcerations. There are no hypoglycemic complications. Symptoms are stable. There are no diabetic complications. Pertinent negatives for diabetic complications include no CVA, heart disease or peripheral neuropathy. Risk factors for coronary artery disease include dyslipidemia, family history, hypertension, obesity, post-menopausal and sedentary lifestyle. She is following a generally healthy diet. Her breakfast blood glucose range is generally 110-130 mg/dl. Eye exam is current.  Gastroesophageal Reflux  She reports no belching, no coughing or no heartburn. This is a chronic problem. The current episode started more than 1 year ago. The problem occurs occasionally. The problem has been waxing and waning. The symptoms are aggravated by certain foods and exertion. She has tried a PPI for the symptoms. The treatment provided moderate relief.  Hyperlipidemia  This is a chronic problem. The current episode started more than 1 year ago. The problem is controlled. Recent lipid tests were reviewed and are normal. Current antihyperlipidemic treatment includes statins. The current treatment provides moderate improvement of lipids. Risk factors for coronary artery disease include diabetes mellitus, dyslipidemia, obesity, post-menopausal and a sedentary lifestyle.  Depression         This is a chronic problem.  The current episode started  more than 1 year ago.   The onset quality is gradual.   The problem occurs intermittently.  The problem has been gradually improving since onset. Osteopenia PT takes Vit D. Last Dexa Scan 06/17/16.    Review of Systems  Eyes: Negative for blurred vision.  Respiratory: Negative for cough.   Gastrointestinal: Negative for heartburn.  Psychiatric/Behavioral: Positive for depression.  All other systems reviewed and are negative.      Objective:   Physical Exam  Constitutional: She is oriented to person, place, and time. She appears well-developed and well-nourished. No distress.  HENT:  Head: Normocephalic and atraumatic.  Right Ear: External ear normal.  Left Ear: External ear normal.  Nose: Nose normal.  Mouth/Throat: Oropharynx is clear and moist.  Eyes: Pupils are equal, round, and reactive to light.  Neck: Normal range of motion. Neck supple. No thyromegaly present.  Cardiovascular: Normal rate, regular rhythm, normal heart sounds and intact distal pulses.   No murmur heard. Pulmonary/Chest: Effort normal and breath sounds normal. No respiratory distress. She has no wheezes.  Abdominal: Soft. Bowel sounds are normal. She exhibits no distension. There is no tenderness.  Musculoskeletal: Normal range of motion. She exhibits no edema or tenderness.  Neurological: She is alert and oriented to person, place, and time.  Skin: Skin is warm and dry.  Psychiatric: She has a normal mood and affect. Her behavior is normal. Judgment and thought content normal.  Vitals reviewed.     BP (!) 143/75   Pulse 61   Temp (!) 97.4 F (36.3 C) (Oral)   Ht 5' 1" (1.549 m)   Wt 153 lb 6.4 oz (69.6 kg)   BMI 28.98 kg/m      Assessment &  Plan:  1. Gastroesophageal reflux disease, esophagitis presence not specified - CMP14+EGFR  2. Moderate episode of recurrent major depressive disorder (HCC) - CMP14+EGFR  3. Hyperlipidemia, unspecified hyperlipidemia type - CMP14+EGFR - Lipid  panel  4. Osteopenia, unspecified location - CMP14+EGFR  5. Type 2 diabetes mellitus with hyperglycemia, without long-term current use of insulin (HCC) - CMP14+EGFR - Bayer DCA Hb A1c Waived  6. Chronic low back pain, unspecified back pain laterality, with sciatica presence unspecified - CMP14+EGFR   Continue all meds Labs pending Health Maintenance reviewed Diet and exercise encouraged RTO 6 months    , FNP  

## 2016-11-13 NOTE — Patient Instructions (Signed)

## 2016-11-14 LAB — CMP14+EGFR
A/G RATIO: 2.2 (ref 1.2–2.2)
ALK PHOS: 62 IU/L (ref 39–117)
ALT: 16 IU/L (ref 0–32)
AST: 24 IU/L (ref 0–40)
Albumin: 4.3 g/dL (ref 3.5–4.8)
BILIRUBIN TOTAL: 0.4 mg/dL (ref 0.0–1.2)
BUN/Creatinine Ratio: 15 (ref 12–28)
BUN: 15 mg/dL (ref 8–27)
CHLORIDE: 103 mmol/L (ref 96–106)
CO2: 28 mmol/L (ref 20–29)
Calcium: 9.1 mg/dL (ref 8.7–10.3)
Creatinine, Ser: 0.99 mg/dL (ref 0.57–1.00)
GFR calc non Af Amer: 58 mL/min/{1.73_m2} — ABNORMAL LOW (ref >59)
GFR, EST AFRICAN AMERICAN: 67 mL/min/{1.73_m2} (ref >59)
GLUCOSE: 89 mg/dL (ref 65–99)
Globulin, Total: 2 g/dL (ref 1.5–4.5)
POTASSIUM: 4.2 mmol/L (ref 3.5–5.2)
Sodium: 142 mmol/L (ref 134–144)
TOTAL PROTEIN: 6.3 g/dL (ref 6.0–8.5)

## 2016-11-14 LAB — LIPID PANEL
CHOLESTEROL TOTAL: 106 mg/dL (ref 100–199)
Chol/HDL Ratio: 2.5 ratio (ref 0.0–4.4)
HDL: 43 mg/dL (ref >39)
LDL Calculated: 45 mg/dL (ref 0–99)
TRIGLYCERIDES: 91 mg/dL (ref 0–149)
VLDL CHOLESTEROL CAL: 18 mg/dL (ref 5–40)

## 2016-11-18 DIAGNOSIS — E1129 Type 2 diabetes mellitus with other diabetic kidney complication: Secondary | ICD-10-CM | POA: Diagnosis not present

## 2016-11-18 DIAGNOSIS — G894 Chronic pain syndrome: Secondary | ICD-10-CM | POA: Diagnosis not present

## 2016-11-18 DIAGNOSIS — M5127 Other intervertebral disc displacement, lumbosacral region: Secondary | ICD-10-CM | POA: Diagnosis not present

## 2016-11-18 DIAGNOSIS — N181 Chronic kidney disease, stage 1: Secondary | ICD-10-CM | POA: Diagnosis not present

## 2016-11-18 DIAGNOSIS — E871 Hypo-osmolality and hyponatremia: Secondary | ICD-10-CM | POA: Diagnosis not present

## 2016-11-18 DIAGNOSIS — M4807 Spinal stenosis, lumbosacral region: Secondary | ICD-10-CM | POA: Diagnosis not present

## 2016-11-18 DIAGNOSIS — I10 Essential (primary) hypertension: Secondary | ICD-10-CM | POA: Diagnosis not present

## 2016-11-18 DIAGNOSIS — M47816 Spondylosis without myelopathy or radiculopathy, lumbar region: Secondary | ICD-10-CM | POA: Diagnosis not present

## 2016-11-18 DIAGNOSIS — M48061 Spinal stenosis, lumbar region without neurogenic claudication: Secondary | ICD-10-CM | POA: Diagnosis not present

## 2016-11-18 DIAGNOSIS — M5417 Radiculopathy, lumbosacral region: Secondary | ICD-10-CM | POA: Diagnosis not present

## 2016-11-18 DIAGNOSIS — M961 Postlaminectomy syndrome, not elsewhere classified: Secondary | ICD-10-CM | POA: Diagnosis not present

## 2016-11-28 ENCOUNTER — Other Ambulatory Visit: Payer: Self-pay | Admitting: Family

## 2016-12-17 ENCOUNTER — Telehealth: Payer: Self-pay | Admitting: Family

## 2016-12-17 NOTE — Telephone Encounter (Signed)
Pt due for mammo left message for her to call back and schedule or let us know if she gets them done somewhere else

## 2017-01-05 ENCOUNTER — Other Ambulatory Visit: Payer: Self-pay | Admitting: Family

## 2017-01-12 ENCOUNTER — Ambulatory Visit: Payer: Medicare Other

## 2017-01-19 ENCOUNTER — Other Ambulatory Visit: Payer: Self-pay | Admitting: *Deleted

## 2017-01-19 MED ORDER — LIRAGLUTIDE 18 MG/3ML ~~LOC~~ SOPN: PEN_INJECTOR | SUBCUTANEOUS | 0 refills | Status: DC

## 2017-01-21 DIAGNOSIS — S46011A Strain of muscle(s) and tendon(s) of the rotator cuff of right shoulder, initial encounter: Secondary | ICD-10-CM | POA: Diagnosis not present

## 2017-02-03 DIAGNOSIS — M7541 Impingement syndrome of right shoulder: Secondary | ICD-10-CM | POA: Diagnosis not present

## 2017-02-03 DIAGNOSIS — M25511 Pain in right shoulder: Secondary | ICD-10-CM | POA: Diagnosis not present

## 2017-02-23 ENCOUNTER — Other Ambulatory Visit: Payer: Self-pay | Admitting: Family

## 2017-02-24 DIAGNOSIS — M7541 Impingement syndrome of right shoulder: Secondary | ICD-10-CM | POA: Diagnosis not present

## 2017-03-05 DIAGNOSIS — Z23 Encounter for immunization: Secondary | ICD-10-CM | POA: Diagnosis not present

## 2017-03-25 ENCOUNTER — Other Ambulatory Visit: Payer: Self-pay | Admitting: Specialist

## 2017-03-25 DIAGNOSIS — M7541 Impingement syndrome of right shoulder: Secondary | ICD-10-CM

## 2017-03-29 ENCOUNTER — Other Ambulatory Visit: Payer: Medicare Other

## 2017-03-29 ENCOUNTER — Ambulatory Visit
Admission: RE | Admit: 2017-03-29 | Discharge: 2017-03-29 | Disposition: A | Discharge status: Home / Self Care | Payer: Medicare Other | Source: Ambulatory Visit | Attending: Specialist | Admitting: Specialist

## 2017-03-29 DIAGNOSIS — M25511 Pain in right shoulder: Secondary | ICD-10-CM | POA: Diagnosis not present

## 2017-03-29 DIAGNOSIS — M7541 Impingement syndrome of right shoulder: Secondary | ICD-10-CM

## 2017-04-02 DIAGNOSIS — M75121 Complete rotator cuff tear or rupture of right shoulder, not specified as traumatic: Secondary | ICD-10-CM | POA: Diagnosis not present

## 2017-04-02 DIAGNOSIS — M25511 Pain in right shoulder: Secondary | ICD-10-CM | POA: Diagnosis not present

## 2017-04-08 ENCOUNTER — Encounter: Payer: Self-pay | Admitting: Family

## 2017-04-08 ENCOUNTER — Ambulatory Visit (INDEPENDENT_AMBULATORY_CARE_PROVIDER_SITE_OTHER): Payer: Medicare Other

## 2017-04-08 ENCOUNTER — Ambulatory Visit (INDEPENDENT_AMBULATORY_CARE_PROVIDER_SITE_OTHER): Payer: Medicare Other | Admitting: Family

## 2017-04-08 VITALS — BP 132/69 | HR 67 | Temp 97.1°F | Ht 61.0 in | Wt 163.6 lb

## 2017-04-08 DIAGNOSIS — Z01818 Encounter for other preprocedural examination: Secondary | ICD-10-CM

## 2017-04-08 DIAGNOSIS — M75101 Unspecified rotator cuff tear or rupture of right shoulder, not specified as traumatic: Secondary | ICD-10-CM | POA: Diagnosis not present

## 2017-04-08 NOTE — Patient Instructions (Signed)
Rotator Cuff Tear Rehab After Surgery Ask your health care provider which exercises are safe for you. Do exercises exactly as told by your health care provider and adjust them as directed. It is normal to feel mild stretching, pulling, tightness, or discomfort as you do these exercises, but you should stop right away if you feel sudden pain or your pain gets worse. Do not begin these exercises until told by your health care provider. Stretching and range of motion exercises These exercises warm up your muscles and joints and improve the movement and flexibility of your shoulder. These exercises also help to relieve pain, numbness, and tingling. Exercise A: Pendulum  1. Stand near a wall or a surface that you can hold onto for balance. 2. Bend at the waist and let your left / right arm hang straight down. Use your other arm to keep your balance. 3. Relax your arm and shoulder muscles, and move your hips and your trunk so your left / right arm swings freely. Your arm should swing because of the motion of your body, not because you are using your arm or shoulder muscles. 4. Keep moving so your arm swings in the following directions, as told by your health care provider: ? Side to side. ? Forward and backward. ? In clockwise and counterclockwise circles. Repeat __________ times, or for __________ seconds per direction. Complete this exercise __________ times a day. Exercise B: Flexion, seated  1. Sit in a stable chair so your left / right forearm can rest on a flat surface. Your elbow should rest at a height that keeps your upper arm next to your body. 2. Keeping your shoulder relaxed, lean forward at the waist and let your hand slide forward. Stop when you feel a stretch in your shoulder, or when you reach the angle that is recommended by your health care provider. 3. Hold for __________ seconds. 4. Slowly return to the starting position. Repeat __________ times. Complete this exercise __________  times a day. Exercise C: Flexion, standing  1. Stand and hold a broomstick, a cane, or a similar object. Place your hands a little more than shoulder-width apart on the object. Your left / right hand should be palm-up, and your other hand should be palm-down. 2. Push the stick down with your healthy arm to raise your left / right arm in front of your body, and then over your head. Use your other hand to help move the stick. Stop when you feel a stretch in your shoulder, or when you reach the angle that is recommended by your health care provider. ? Avoid shrugging your shoulder while you raise your arm. Keep your shoulder blade tucked down toward your spine. ? Keep your left / right shoulder muscles relaxed. 3. Hold for __________ seconds. 4. Slowly return to the starting position. Repeat __________ times. Complete this exercise __________ times a day. Exercise D: Abduction, supine  1. Lie on your back and hold a broomstick, a cane, or a similar object. Place your hands a little more than shoulder-width apart on the object. Your left / right hand should be palm-up, and your other hand should be palm-down. 2. Push the stick to raise your left / right arm out to your side and then over your head. Use your other hand to help move the stick. Stop when you feel a stretch in your shoulder, or when you reach the angle that is recommended by your health care provider. ? Avoid shrugging your shoulder while   you raise your arm. Keep your shoulder blade tucked down toward your spine. 3. Hold for __________ seconds. 4. Slowly return to the starting position. Repeat __________ times. Complete this exercise __________ times a day. Exercise E: Shoulder flexion, active-assisted  1. Lie on your back. You may bend your knees for comfort. 2. Hold a broomstick, a cane, or a similar object so your hands are about shoulder-width apart. Your palms should face toward your feet. 3. Raise your left / right arm over your  head and behind your head, toward the floor. Use your other hand to help you do this. Stop when you feel a gentle stretch in your shoulder, or when you reach the angle that is recommended by your health care provider. 4. Hold for __________ seconds. 5. Use the broomstick and your other arm to help you return your left / right arm to the starting position. Repeat __________ times. Complete this exercise __________ times a day. Exercise F: External rotation  1. Sit in a stable chair without armrests, or stand. 2. Tuck a soft object, such as a folded towel or a small ball, under your left / right upper arm. 3. Hold a broomstick, a cane, or a similar object so your palms face down, toward the floor. Bend your elbows to an "L" shape (90 degrees), and keep your hands about shoulder-width apart. 4. Straighten your healthy arm and push the broomstick across your body, toward your left / right side. Keep your left / right arm bent. This will rotate your left / right forearm away from your body. 5. Hold for __________ seconds. 6. Slowly return to the starting position. Repeat __________ times. Complete this exercise __________ times a day. Strengthening exercises These exercises build strength and endurance in your shoulder. Endurance is the ability to use your muscles for a long time, even after they get tired. Exercise G: Shoulder flexion, isometric  1. Stand or sit about 4-6 inches (10-15 cm) away from a wall with your left / right side facing the wall. 2. Gently make a fist and place your left / right hand on the wall so the top of your fist touches the wall. 3. With your left / right elbow straight, gently press the top of your fist into the wall. Gradually increase the pressure until you are pressing as hard as you can without shrugging your shoulder. 4. Hold for __________ seconds. 5. Slowly release the tension and relax your muscles completely before you repeat the exercise. Repeat __________  times. Complete this exercise __________ times a day. Exercise H: Shoulder abduction, isometric  1. Stand or sit about 4-6 inches (10-15 cm) away from a wall with your right/left side facing the wall. 2. Bend your left / right elbow and gently press your elbow into the wall as if you are trying to move your arm out to your side. Increase the pressure gradually until you are pressing as hard as you can without shrugging your shoulder. 3. Hold for __________ seconds. 4. Slowly release the tension and relax your muscles completely before repeating the exercise. Repeat __________ times. Complete this exercise __________ times a day. Exercise I: Internal rotation, isometric  1. Stand or sit in a doorway, facing the door frame. 2. Bend your left / right elbow and place the palm of your hand against the door frame. Only your palm should be touching the frame. Keep your upper arm at your side. 3. Gently press your hand into the door frame, as if   you are trying to push your arm toward your abdomen. Do not let your wrist bend. ? Avoid shrugging your shoulder while you press your hand into the door frame. Keep your shoulder blade tucked down toward the middle of your back. 4. Hold for __________ seconds. 5. Slowly release the tension, and relax your muscles completely before you repeat the exercise. Repeat __________ times. Complete this exercise __________ times a day. Exercise J: External rotation, isometric  1. Stand or sit in a doorway, facing the door frame. 2. Bend your left / right elbow and place the back of your wrist against the door frame. Only the back of your wrist should be touching the frame. Keep your upper arm at your side. 3. Gently press your wrist against the door frame, as if you are trying to push your arm away from your abdomen. ? Avoid shrugging your shoulder while you press your wrist into the door frame. Keep your shoulder blade tucked down toward the middle of your  back. 4. Hold for __________ seconds. 5. Slowly release the tension, and relax your muscles completely before you repeat the exercise. Repeat __________ times. Complete this exercise __________ times a day. This information is not intended to replace advice given to you by your health care provider. Make sure you discuss any questions you have with your health care provider. Document Released: 03/17/2005 Document Revised: 11/22/2015 Document Reviewed: 03/31/2015 Elsevier Interactive Patient Education  2018 [employer]  

## 2017-04-08 NOTE — Progress Notes (Signed)
   Subjective:    Patient ID: Sonya Cook, female    DOB: [birth year], 72 y.o.   MRN: [id number]  HPI Pt presents to the office today for surgical clearance for a right rotator cuff repair. Pt states she has intermittent pain of 5 out 10 that is worse when she is using her arm.   Pt denies any headache, chest pains, palpitations, SOB, or edema at this time.    Review of Systems  Musculoskeletal: Positive for arthralgias.  All other systems reviewed and are negative.      Objective:   Physical Exam  Constitutional: She is oriented to person, place, and time. She appears well-developed and well-nourished. No distress.  HENT:  Head: Normocephalic.  Eyes: Pupils are equal, round, and reactive to light.  Neck: Normal range of motion. Neck supple. No thyromegaly present.  Cardiovascular: Normal rate, regular rhythm, normal heart sounds and intact distal pulses.  No murmur heard. Pulmonary/Chest: Effort normal and breath sounds normal. No respiratory distress. She has no wheezes.  Abdominal: Soft. Bowel sounds are normal. She exhibits no distension. There is no tenderness.  Musculoskeletal: She exhibits no edema or tenderness.  Decrease ROM of right arm with abduction   Neurological: She is alert and oriented to person, place, and time.  Skin: Skin is warm and dry.  Psychiatric: She has a normal mood and affect. Her behavior is normal. Judgment and thought content normal.  Vitals reviewed.     BP 132/69   Pulse 67   Temp (!) 97.1 F (36.2 C) (Oral)   Ht '5\' 1"'$  (1.549 m)   Wt 163 lb 9.6 oz (74.2 kg)   BMI 30.91 kg/m      Assessment & Plan:  1. Pre-op examination - DG Chest 2 View; Future - EKG 12-Lead - CBC with Differential/Platelet - CMP14+EGFR  2. Tear of right rotator cuff, unspecified tear extent  Labs pending Will sign off on Surgical Clearance form and fax back once labs are back Keep appt with Ortho RTO prn and keep chronic follow up    Evelina Dun,  FNP

## 2017-04-09 ENCOUNTER — Other Ambulatory Visit: Payer: Self-pay | Admitting: Family

## 2017-04-09 DIAGNOSIS — I700 Atherosclerosis of aorta: Secondary | ICD-10-CM

## 2017-04-09 LAB — CBC WITH DIFFERENTIAL/PLATELET
BASOS ABS: 0 10*3/uL (ref 0.0–0.2)
Basos: 0 %
EOS (ABSOLUTE): 0.2 10*3/uL (ref 0.0–0.4)
Eos: 3 %
HEMOGLOBIN: 12.4 g/dL (ref 11.1–15.9)
Hematocrit: 37.2 % (ref 34.0–46.6)
IMMATURE GRANS (ABS): 0 10*3/uL (ref 0.0–0.1)
IMMATURE GRANULOCYTES: 0 %
LYMPHS ABS: 2.8 10*3/uL (ref 0.7–3.1)
LYMPHS: 38 %
MCH: 32 pg (ref 26.6–33.0)
MCHC: 33.3 g/dL (ref 31.5–35.7)
MCV: 96 fL (ref 79–97)
MONOCYTES: 9 %
Monocytes Absolute: 0.7 10*3/uL (ref 0.1–0.9)
Neutrophils Absolute: 3.7 10*3/uL (ref 1.4–7.0)
Neutrophils: 50 %
Platelets: 178 10*3/uL (ref 150–379)
RBC: 3.87 x10E6/uL (ref 3.77–5.28)
RDW: 14.8 % (ref 12.3–15.4)
WBC: 7.4 10*3/uL (ref 3.4–10.8)

## 2017-04-09 LAB — CMP14+EGFR
ALBUMIN: 4.6 g/dL (ref 3.5–4.8)
ALK PHOS: 58 IU/L (ref 39–117)
ALT: 13 IU/L (ref 0–32)
AST: 14 IU/L (ref 0–40)
Albumin/Globulin Ratio: 1.8 (ref 1.2–2.2)
BUN / CREAT RATIO: 25 (ref 12–28)
BUN: 26 mg/dL (ref 8–27)
Bilirubin Total: 0.3 mg/dL (ref 0.0–1.2)
CO2: 26 mmol/L (ref 20–29)
CREATININE: 1.03 mg/dL — AB (ref 0.57–1.00)
Calcium: 9.7 mg/dL (ref 8.7–10.3)
Chloride: 99 mmol/L (ref 96–106)
GFR calc Af Amer: 64 mL/min/{1.73_m2} (ref >59)
GFR calc non Af Amer: 55 mL/min/{1.73_m2} — ABNORMAL LOW (ref >59)
GLUCOSE: 99 mg/dL (ref 65–99)
Globulin, Total: 2.5 g/dL (ref 1.5–4.5)
Potassium: 5.2 mmol/L (ref 3.5–5.2)
Sodium: 139 mmol/L (ref 134–144)
Total Protein: 7.1 g/dL (ref 6.0–8.5)

## 2017-04-13 ENCOUNTER — Ambulatory Visit: Payer: Self-pay | Admitting: Orthopedic Surgery

## 2017-04-14 ENCOUNTER — Ambulatory Visit: Payer: Self-pay | Admitting: Orthopedic Surgery

## 2017-04-14 NOTE — H&P (Signed)
Sonya Cook is an 72 y.o. female.   Chief Complaint: R shoulder pain HPI: Patient reports night. She reports Radiating pain to the right shoulder. She reports narcotics and NSAIDs. She reports helped temporarily. She reports x ray. She reports right hand dominant. She reports for 12 weeks. She reports none.  Past Medical History:  Diagnosis Date  . Chronic kidney disease   . Depression   . Diabetes mellitus without complication ([place])   . Dyslipidemia   . GERD (gastroesophageal reflux disease)   . Hiatal hernia   . Hyperlipidemia   . Internal hemorrhoids   . Osteopenia   . Vitamin D deficiency     Past Surgical History:  Procedure Laterality Date  . ABDOMINAL HYSTERECTOMY    . LUMBAR DISC SURGERY      Family History  Problem Relation Age of Onset  . Hodgkin's lymphoma Mother   . Cancer Father        Lungs  . Colon cancer Neg Hx   . Diabetes Neg Hx   . Stomach cancer Neg Hx   . Rectal cancer Neg Hx    Social History:  reports that she has quit smoking. Her smoking use included cigarettes. She started smoking about 16 years ago. she has never used smokeless tobacco. She reports that she drinks alcohol. She reports that she does not use drugs.  Allergies:  Allergies  Allergen Reactions  . Codeine     REACTION: hives  . Penicillins     REACTION: hives     (Not in a hospital admission)  No results found for this or any previous visit (from the past 48 hour(s)). No results found.  Review of Systems  Constitutional: Negative.   HENT: Negative.   Eyes: Negative.   Respiratory: Negative.   Cardiovascular: Negative.   Gastrointestinal: Negative.   Genitourinary: Negative.   Musculoskeletal: Positive for back pain and joint pain.  Skin: Negative.   Neurological: Negative.   Psychiatric/Behavioral: Negative.     There were no vitals taken for this visit. Physical Exam  Constitutional: She is oriented to person, place, and time. She appears well-developed.   HENT:  Head: Normocephalic.  Eyes: Pupils are equal, round, and reactive to light.  Neck: Normal range of motion.  Cardiovascular: Normal rate.  Respiratory: Effort normal.  GI: Soft.  Musculoskeletal:  Constitutional General Appearance: healthy-appearing, NAD  Psychiatric Mood and Affect: normal mood, normal affect  Cardiovascular System Arterial Pulses Right: radial normal, brachial normal Arterial Pulses Left: radial normal, brachial normal Edema Left: none Varicosities Right: no varicosities Varicosities Left: no varicosities  C-Spine/Neck Active Range of Motion: flexion normal, extension normal, no pain elicited on motion  Shoulders Inspection Right: no misalignment, no scapular winging, no atrophy, no erythema, no swelling Inspection Left: no misalignment, no scapular winging, no atrophy, no erythema, no swelling Bony Palpation Right: no tenderness of the sternoclavicular joint, no tenderness of the coracoid process, no tenderness of the acromioclavicular joint, no tenderness of the bicipital groove, no tenderness of the scapula Bony Palpation Left: no tenderness of the sternoclavicular joint, no tenderness of the coracoid process, no tenderness of the acromioclavicular joint, no tenderness of the bicipital groove, no tenderness of the scapula Soft Tissue Palpation Right: tenderness of the supraspinatus, tenderness of the subacromial bursa Soft Tissue Palpation Left: tenderness of the supraspinatus, tenderness of the subacromial bursa Active Range of Motion Right: limited Active Range of Motion Left: limited Special Tests Right: Speed's test negative, Neer's test positive Special Tests  Left: Speed's test negative, Neer's test positive Stability Right: no laxity, sulcus sign negative, posterior load and shift test negative Stability Left: no laxity, sulcus sign negative, posterior load and shift test negative Strength Right: abduction 5/5, adduction 5/5, flexion 5/5,  extension 5/5 Strength Left: abduction 5/5, adduction 5/5, flexion 5/5, extension 5/5  Skin Right Upper Extremity: normal Left Upper Extremity: normal  Neurological System Biceps Reflex Right: normal (2) Biceps Reflex Left: normal (2) Brachioradialis Reflex Right: normal (2) Brachioradialis Reflex Left: normal (2) Triceps Reflex Right: normal (2) Triceps Reflex Left: normal (2) Sensation on the Right: C5 normal, C6 normal, C7 normal Sensation on the Left: C5 normal, C6 normal, C7 normal  Neurological: She is alert and oriented to person, place, and time.     Assessment/Plan Patient demonstrates a retracted tear of the rotator cuff supraspinatus and infraspinatus.  She remains significantly symptomatic including abduction type maneuvers.  Her MRI shows retracted tears of the supraspinatus and infraspinatus. There is no fatty atrophy or muscle atrophy.  We discussed at length operative intervention versus conservative treatment.  We discussed proceeding with mini open rotator cuff repair with possible patch graft including the risks and benefits of bleeding, infection, damage to neurovascular she is. No changes in her symptoms. Worsening symptoms. 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  Will kindly request preoperative clearance.  Plan R shoulder mini-open RCR, possible patch graft  Cecilie Kicks., PA-C for Dr. Tonita Cong 04/14/2017, 8:58 AM

## 2017-04-14 NOTE — H&P (View-Only) (Signed)
Sonya Cook is an 72 y.o. female.   Chief Complaint: R shoulder pain HPI: Patient reports night. She reports Radiating pain to the right shoulder. She reports narcotics and NSAIDs. She reports helped temporarily. She reports x ray. She reports right hand dominant. She reports for 12 weeks. She reports none.  Past Medical History:  Diagnosis Date  . Chronic kidney disease   . Depression   . Diabetes mellitus without complication ([place])   . Dyslipidemia   . GERD (gastroesophageal reflux disease)   . Hiatal hernia   . Hyperlipidemia   . Internal hemorrhoids   . Osteopenia   . Vitamin D deficiency     Past Surgical History:  Procedure Laterality Date  . ABDOMINAL HYSTERECTOMY    . LUMBAR DISC SURGERY      Family History  Problem Relation Age of Onset  . Hodgkin's lymphoma Mother   . Cancer Father        Lungs  . Colon cancer Neg Hx   . Diabetes Neg Hx   . Stomach cancer Neg Hx   . Rectal cancer Neg Hx    Social History:  reports that she has quit smoking. Her smoking use included cigarettes. She started smoking about 16 years ago. she has never used smokeless tobacco. She reports that she drinks alcohol. She reports that she does not use drugs.  Allergies:  Allergies  Allergen Reactions  . Codeine     REACTION: hives  . Penicillins     REACTION: hives     (Not in a hospital admission)  No results found for this or any previous visit (from the past 48 hour(s)). No results found.  Review of Systems  Constitutional: Negative.   HENT: Negative.   Eyes: Negative.   Respiratory: Negative.   Cardiovascular: Negative.   Gastrointestinal: Negative.   Genitourinary: Negative.   Musculoskeletal: Positive for back pain and joint pain.  Skin: Negative.   Neurological: Negative.   Psychiatric/Behavioral: Negative.     There were no vitals taken for this visit. Physical Exam  Constitutional: She is oriented to person, place, and time. She appears well-developed.   HENT:  Head: Normocephalic.  Eyes: Pupils are equal, round, and reactive to light.  Neck: Normal range of motion.  Cardiovascular: Normal rate.  Respiratory: Effort normal.  GI: Soft.  Musculoskeletal:  Constitutional General Appearance: healthy-appearing, NAD  Psychiatric Mood and Affect: normal mood, normal affect  Cardiovascular System Arterial Pulses Right: radial normal, brachial normal Arterial Pulses Left: radial normal, brachial normal Edema Left: none Varicosities Right: no varicosities Varicosities Left: no varicosities  C-Spine/Neck Active Range of Motion: flexion normal, extension normal, no pain elicited on motion  Shoulders Inspection Right: no misalignment, no scapular winging, no atrophy, no erythema, no swelling Inspection Left: no misalignment, no scapular winging, no atrophy, no erythema, no swelling Bony Palpation Right: no tenderness of the sternoclavicular joint, no tenderness of the coracoid process, no tenderness of the acromioclavicular joint, no tenderness of the bicipital groove, no tenderness of the scapula Bony Palpation Left: no tenderness of the sternoclavicular joint, no tenderness of the coracoid process, no tenderness of the acromioclavicular joint, no tenderness of the bicipital groove, no tenderness of the scapula Soft Tissue Palpation Right: tenderness of the supraspinatus, tenderness of the subacromial bursa Soft Tissue Palpation Left: tenderness of the supraspinatus, tenderness of the subacromial bursa Active Range of Motion Right: limited Active Range of Motion Left: limited Special Tests Right: Speed's test negative, Neer's test positive Special Tests  Left: Speed's test negative, Neer's test positive Stability Right: no laxity, sulcus sign negative, posterior load and shift test negative Stability Left: no laxity, sulcus sign negative, posterior load and shift test negative Strength Right: abduction 5/5, adduction 5/5, flexion 5/5,  extension 5/5 Strength Left: abduction 5/5, adduction 5/5, flexion 5/5, extension 5/5  Skin Right Upper Extremity: normal Left Upper Extremity: normal  Neurological System Biceps Reflex Right: normal (2) Biceps Reflex Left: normal (2) Brachioradialis Reflex Right: normal (2) Brachioradialis Reflex Left: normal (2) Triceps Reflex Right: normal (2) Triceps Reflex Left: normal (2) Sensation on the Right: C5 normal, C6 normal, C7 normal Sensation on the Left: C5 normal, C6 normal, C7 normal  Neurological: She is alert and oriented to person, place, and time.     Assessment/Plan Patient demonstrates a retracted tear of the rotator cuff supraspinatus and infraspinatus.  She remains significantly symptomatic including abduction type maneuvers.  Her MRI shows retracted tears of the supraspinatus and infraspinatus. There is no fatty atrophy or muscle atrophy.  We discussed at length operative intervention versus conservative treatment.  We discussed proceeding with mini open rotator cuff repair with possible patch graft including the risks and benefits of bleeding, infection, damage to neurovascular she is. No changes in her symptoms. Worsening symptoms. 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  Will kindly request preoperative clearance.  Plan R shoulder mini-open RCR, possible patch graft  Cecilie Kicks., PA-C for Dr. Tonita Cong 04/14/2017, 8:58 AM

## 2017-04-15 ENCOUNTER — Other Ambulatory Visit: Payer: Self-pay | Admitting: Family

## 2017-04-23 NOTE — Patient Instructions (Addendum)
Sonya Cook  04/23/2017   Your procedure is scheduled on: 04/30/17   Report to [facility] Main  Entrance  Follow signs to Short Stay on first floor at 530 AM  Call this number if you have problems the morning of surgery [phone]   Remember: Do not eat food or drink liquids :After Midnight.     Take these medicines the morning of surgery with A SIP OF WATER: Cymbalta,Lexapro  DO NOT TAKE ANY DIABETIC MEDICATIONS DAY OF YOUR SURGERY                               You may not have any metal on your body including hair pins and              piercings  Do not wear jewelry, make-up, lotions, powders or perfumes, deodorant             Do not wear nail polish.  Do not shave  48 hours prior to surgery.               Do not bring valuables to the hospital. [place].  Contacts, dentures or bridgework may not be worn into surgery.  Leave suitcase in the car. After surgery it may be brought to your room.                       Please read over the following fact sheets you were given: _____________________________________________________________________             [facility] - Preparing for Surgery Before surgery, you can play an important role.  Because skin is not sterile, your skin needs to be as free of germs as possible.  You can reduce the number of germs on your skin by washing with CHG (chlorahexidine gluconate) soap before surgery.  CHG is an antiseptic cleaner which kills germs and bonds with the skin to continue killing germs even after washing. Please DO NOT use if you have an allergy to CHG or antibacterial soaps.  If your skin becomes reddened/irritated stop using the CHG and inform your nurse when you arrive at Short Stay. Do not shave (including legs and underarms) for at least 48 hours prior to the first CHG shower.  You may shave your face/neck. Please follow these instructions  carefully:  1.  Shower with CHG Soap the night before surgery and the  morning of Surgery.  2.  If you choose to wash your hair, wash your hair first as usual with your  normal  shampoo.  3.  After you shampoo, rinse your hair and body thoroughly to remove the  shampoo.                           4.  Use CHG as you would any other liquid soap.  You can apply chg directly  to the skin and wash                       Gently with a scrungie or clean washcloth.  5.  Apply the CHG Soap to your body ONLY FROM THE NECK DOWN.   Do not use  on face/ open                           Wound or open sores. Avoid contact with eyes, ears mouth and genitals (private parts).                       Wash face,  Genitals (private parts) with your normal soap.             6.  Wash thoroughly, paying special attention to the area where your surgery  will be performed.  7.  Thoroughly rinse your body with warm water from the neck down.  8.  DO NOT shower/wash with your normal soap after using and rinsing off  the CHG Soap.                9.  Pat yourself dry with a clean towel.            10.  Wear clean pajamas.            11.  Place clean sheets on your bed the night of your first shower and do not  sleep with pets. Day of Surgery : Do not apply any lotions/deodorants the morning of surgery.  Please wear clean clothes to the hospital/surgery center.  FAILURE TO FOLLOW THESE INSTRUCTIONS MAY RESULT IN THE CANCELLATION OF YOUR SURGERY PATIENT SIGNATURE_________________________________  NURSE SIGNATURE__________________________________  ________________________________________________________________________   Sonya Cook  An incentive spirometer is a tool that can help keep your lungs clear and active. This tool measures how well you are filling your lungs with each breath. Taking long deep breaths may help reverse or decrease the chance of developing breathing (pulmonary) problems (especially infection)  following:  A long period of time when you are unable to move or be active. BEFORE THE PROCEDURE   If the spirometer includes an indicator to show your best effort, your nurse or respiratory therapist will set it to a desired goal.  If possible, sit up straight or lean slightly forward. Try not to slouch.  Hold the incentive spirometer in an upright position. INSTRUCTIONS FOR USE  1. Sit on the edge of your bed if possible, or sit up as far as you can in bed or on a chair. 2. Hold the incentive spirometer in an upright position. 3. Breathe out normally. 4. Place the mouthpiece in your mouth and seal your lips tightly around it. 5. Breathe in slowly and as deeply as possible, raising the piston or the ball toward the top of the column. 6. Hold your breath for 3-5 seconds or for as long as possible. Allow the piston or ball to fall to the bottom of the column. 7. Remove the mouthpiece from your mouth and breathe out normally. 8. Rest for a few seconds and repeat Steps 1 through 7 at least 10 times every 1-2 hours when you are awake. Take your time and take a few normal breaths between deep breaths. 9. The spirometer may include an indicator to show your best effort. Use the indicator as a goal to work toward during each repetition. 10. After each set of 10 deep breaths, practice coughing to be sure your lungs are clear. If you have an incision (the cut made at the time of surgery), support your incision when coughing by placing a pillow or rolled up towels firmly against it. Once you are able to get out of bed, walk around  indoors and cough well. You may stop using the incentive spirometer when instructed by your caregiver.  RISKS AND COMPLICATIONS  Take your time so you do not get dizzy or light-headed.  If you are in pain, you may need to take or ask for pain medication before doing incentive spirometry. It is harder to take a deep breath if you are having pain. AFTER USE  Rest and  breathe slowly and easily.  It can be helpful to keep track of a log of your progress. Your caregiver can provide you with a simple table to help with this. If you are using the spirometer at home, follow these instructions: [place] IF:   You are having difficultly using the spirometer.  You have trouble using the spirometer as often as instructed.  Your pain medication is not giving enough relief while using the spirometer.  You develop fever of 100.5 F (38.1 C) or higher. SEEK IMMEDIATE MEDICAL CARE IF:   You cough up bloody sputum that had not been present before.  You develop fever of 102 F (38.9 C) or greater.  You develop worsening pain at or near the incision site. MAKE SURE YOU:   Understand these instructions.  Will watch your condition.  Will get help right away if you are not doing well or get worse. Document Released: 07/28/2006 Document Revised: 06/09/2011 Document Reviewed: 09/28/2006 [facility] Patient Information 2014 [place], [place].   ________________________________________________________________________

## 2017-04-23 NOTE — Progress Notes (Signed)
Requested clearance from [facility] from [facility] from Dr Lenna Gilford.  Per epic note Dr Lenna Gilford states clearance has been sent to Dr Tonita Cong.

## 2017-04-27 ENCOUNTER — Encounter (HOSPITAL_COMMUNITY): Payer: Self-pay

## 2017-04-27 ENCOUNTER — Other Ambulatory Visit: Payer: Self-pay

## 2017-04-27 ENCOUNTER — Encounter (HOSPITAL_COMMUNITY)
Admission: RE | Admit: 2017-04-27 | Discharge: 2017-04-27 | Disposition: A | Discharge status: Home / Self Care | Payer: Medicare Other | Source: Ambulatory Visit | Attending: Specialist | Admitting: Specialist

## 2017-04-27 DIAGNOSIS — Z801 Family history of malignant neoplasm of trachea, bronchus and lung: Secondary | ICD-10-CM | POA: Diagnosis not present

## 2017-04-27 DIAGNOSIS — Z9889 Other specified postprocedural states: Secondary | ICD-10-CM | POA: Diagnosis not present

## 2017-04-27 DIAGNOSIS — M7591 Shoulder lesion, unspecified, right shoulder: Secondary | ICD-10-CM | POA: Diagnosis not present

## 2017-04-27 DIAGNOSIS — E785 Hyperlipidemia, unspecified: Secondary | ICD-10-CM | POA: Diagnosis not present

## 2017-04-27 DIAGNOSIS — Z90710 Acquired absence of both cervix and uterus: Secondary | ICD-10-CM | POA: Diagnosis not present

## 2017-04-27 DIAGNOSIS — Z79899 Other long term (current) drug therapy: Secondary | ICD-10-CM | POA: Diagnosis not present

## 2017-04-27 DIAGNOSIS — M75101 Unspecified rotator cuff tear or rupture of right shoulder, not specified as traumatic: Secondary | ICD-10-CM | POA: Diagnosis not present

## 2017-04-27 DIAGNOSIS — K449 Diaphragmatic hernia without obstruction or gangrene: Secondary | ICD-10-CM | POA: Diagnosis not present

## 2017-04-27 DIAGNOSIS — F329 Major depressive disorder, single episode, unspecified: Secondary | ICD-10-CM | POA: Diagnosis not present

## 2017-04-27 DIAGNOSIS — Z791 Long term (current) use of non-steroidal anti-inflammatories (NSAID): Secondary | ICD-10-CM | POA: Diagnosis not present

## 2017-04-27 DIAGNOSIS — Z807 Family history of other malignant neoplasms of lymphoid, hematopoietic and related tissues: Secondary | ICD-10-CM | POA: Diagnosis not present

## 2017-04-27 DIAGNOSIS — E1122 Type 2 diabetes mellitus with diabetic chronic kidney disease: Secondary | ICD-10-CM | POA: Diagnosis not present

## 2017-04-27 DIAGNOSIS — N189 Chronic kidney disease, unspecified: Secondary | ICD-10-CM | POA: Diagnosis not present

## 2017-04-27 DIAGNOSIS — M8580 Other specified disorders of bone density and structure, unspecified site: Secondary | ICD-10-CM | POA: Diagnosis not present

## 2017-04-27 DIAGNOSIS — Z885 Allergy status to narcotic agent status: Secondary | ICD-10-CM | POA: Diagnosis not present

## 2017-04-27 DIAGNOSIS — Z880 Allergy status to penicillin: Secondary | ICD-10-CM | POA: Diagnosis not present

## 2017-04-27 DIAGNOSIS — K219 Gastro-esophageal reflux disease without esophagitis: Secondary | ICD-10-CM | POA: Diagnosis not present

## 2017-04-27 DIAGNOSIS — K648 Other hemorrhoids: Secondary | ICD-10-CM | POA: Diagnosis not present

## 2017-04-27 DIAGNOSIS — Z87891 Personal history of nicotine dependence: Secondary | ICD-10-CM | POA: Diagnosis not present

## 2017-04-27 HISTORY — DX: Anxiety disorder, unspecified: F41.9

## 2017-04-27 HISTORY — DX: Family history of other specified conditions: Z84.89

## 2017-04-27 HISTORY — DX: Other specified postprocedural states: Z98.890

## 2017-04-27 HISTORY — DX: Essential (primary) hypertension: I10

## 2017-04-27 HISTORY — DX: Nausea with vomiting, unspecified: R11.2

## 2017-04-27 LAB — HEMOGLOBIN A1C
Hgb A1c MFr Bld: 6.1 % — ABNORMAL HIGH (ref 4.8–5.6)
MEAN PLASMA GLUCOSE: 128.37 mg/dL

## 2017-04-27 LAB — GLUCOSE, CAPILLARY: Glucose-Capillary: 124 mg/dL — ABNORMAL HIGH (ref 65–99)

## 2017-04-27 NOTE — Progress Notes (Signed)
04-08-2017  CBCdiff, CMP, CXR, and EKG in Epic. Clearance from [facility] FNP in chart.

## 2017-04-30 ENCOUNTER — Other Ambulatory Visit: Payer: Self-pay

## 2017-04-30 ENCOUNTER — Ambulatory Visit (HOSPITAL_COMMUNITY): Payer: Medicare Other | Admitting: Anesthesiology

## 2017-04-30 ENCOUNTER — Encounter (HOSPITAL_COMMUNITY): Payer: Self-pay | Admitting: *Deleted

## 2017-04-30 ENCOUNTER — Encounter (HOSPITAL_COMMUNITY)
Admission: RE | Disposition: A | Discharge status: Home / Self Care | Payer: Self-pay | Source: Ambulatory Visit | Attending: Specialist

## 2017-04-30 ENCOUNTER — Ambulatory Visit (HOSPITAL_COMMUNITY)
Admission: RE | Admit: 2017-04-30 | Discharge: 2017-04-30 | Disposition: A | Discharge status: Home / Self Care | Payer: Medicare Other | Source: Ambulatory Visit | Attending: Specialist | Admitting: Specialist

## 2017-04-30 DIAGNOSIS — E119 Type 2 diabetes mellitus without complications: Secondary | ICD-10-CM | POA: Diagnosis not present

## 2017-04-30 DIAGNOSIS — N189 Chronic kidney disease, unspecified: Secondary | ICD-10-CM | POA: Diagnosis not present

## 2017-04-30 DIAGNOSIS — Z90710 Acquired absence of both cervix and uterus: Secondary | ICD-10-CM | POA: Insufficient documentation

## 2017-04-30 DIAGNOSIS — E785 Hyperlipidemia, unspecified: Secondary | ICD-10-CM | POA: Insufficient documentation

## 2017-04-30 DIAGNOSIS — Z79899 Other long term (current) drug therapy: Secondary | ICD-10-CM | POA: Insufficient documentation

## 2017-04-30 DIAGNOSIS — M8580 Other specified disorders of bone density and structure, unspecified site: Secondary | ICD-10-CM | POA: Diagnosis not present

## 2017-04-30 DIAGNOSIS — Z791 Long term (current) use of non-steroidal anti-inflammatories (NSAID): Secondary | ICD-10-CM | POA: Diagnosis not present

## 2017-04-30 DIAGNOSIS — Z801 Family history of malignant neoplasm of trachea, bronchus and lung: Secondary | ICD-10-CM | POA: Insufficient documentation

## 2017-04-30 DIAGNOSIS — F329 Major depressive disorder, single episode, unspecified: Secondary | ICD-10-CM | POA: Diagnosis not present

## 2017-04-30 DIAGNOSIS — E1122 Type 2 diabetes mellitus with diabetic chronic kidney disease: Secondary | ICD-10-CM | POA: Diagnosis not present

## 2017-04-30 DIAGNOSIS — M7591 Shoulder lesion, unspecified, right shoulder: Secondary | ICD-10-CM | POA: Insufficient documentation

## 2017-04-30 DIAGNOSIS — G8918 Other acute postprocedural pain: Secondary | ICD-10-CM | POA: Diagnosis not present

## 2017-04-30 DIAGNOSIS — M75121 Complete rotator cuff tear or rupture of right shoulder, not specified as traumatic: Secondary | ICD-10-CM | POA: Diagnosis not present

## 2017-04-30 DIAGNOSIS — M75101 Unspecified rotator cuff tear or rupture of right shoulder, not specified as traumatic: Secondary | ICD-10-CM | POA: Diagnosis not present

## 2017-04-30 DIAGNOSIS — Z880 Allergy status to penicillin: Secondary | ICD-10-CM | POA: Insufficient documentation

## 2017-04-30 DIAGNOSIS — K219 Gastro-esophageal reflux disease without esophagitis: Secondary | ICD-10-CM | POA: Diagnosis not present

## 2017-04-30 DIAGNOSIS — K648 Other hemorrhoids: Secondary | ICD-10-CM | POA: Insufficient documentation

## 2017-04-30 DIAGNOSIS — Z9889 Other specified postprocedural states: Secondary | ICD-10-CM | POA: Insufficient documentation

## 2017-04-30 DIAGNOSIS — K449 Diaphragmatic hernia without obstruction or gangrene: Secondary | ICD-10-CM | POA: Diagnosis not present

## 2017-04-30 DIAGNOSIS — Z807 Family history of other malignant neoplasms of lymphoid, hematopoietic and related tissues: Secondary | ICD-10-CM | POA: Insufficient documentation

## 2017-04-30 DIAGNOSIS — Z885 Allergy status to narcotic agent status: Secondary | ICD-10-CM | POA: Insufficient documentation

## 2017-04-30 DIAGNOSIS — Z87891 Personal history of nicotine dependence: Secondary | ICD-10-CM | POA: Insufficient documentation

## 2017-04-30 HISTORY — PX: SHOULDER OPEN ROTATOR CUFF REPAIR: SHX2407

## 2017-04-30 LAB — GLUCOSE, CAPILLARY
GLUCOSE-CAPILLARY: 134 mg/dL — AB (ref 65–99)
Glucose-Capillary: 112 mg/dL — ABNORMAL HIGH (ref 65–99)

## 2017-04-30 SURGERY — REPAIR, ROTATOR CUFF, OPEN
Anesthesia: Regional | Site: Shoulder | Laterality: Right

## 2017-04-30 MED ORDER — PHENYLEPHRINE 40 MCG/ML (10ML) SYRINGE FOR IV PUSH (FOR BLOOD PRESSURE SUPPORT)
PREFILLED_SYRINGE | INTRAVENOUS | Status: DC | PRN
  Administered 2017-04-30 (×2): 80 ug via INTRAVENOUS
  Administered 2017-04-30: 120 ug via INTRAVENOUS

## 2017-04-30 MED ORDER — SUGAMMADEX SODIUM 200 MG/2ML IV SOLN
INTRAVENOUS | Status: AC
  Filled 2017-04-30: qty 2

## 2017-04-30 MED ORDER — SUGAMMADEX SODIUM 200 MG/2ML IV SOLN
INTRAVENOUS | Status: DC | PRN
  Administered 2017-04-30: 300 mg via INTRAVENOUS

## 2017-04-30 MED ORDER — CHLORHEXIDINE GLUCONATE 4 % EX LIQD: 60.0000 mL | Freq: Once | CUTANEOUS | Status: DC

## 2017-04-30 MED ORDER — LIDOCAINE 2% (20 MG/ML) 5 ML SYRINGE
INTRAMUSCULAR | Status: AC
  Filled 2017-04-30: qty 5

## 2017-04-30 MED ORDER — EPHEDRINE 5 MG/ML INJ
INTRAVENOUS | Status: AC
  Filled 2017-04-30: qty 10

## 2017-04-30 MED ORDER — HYDROCODONE-ACETAMINOPHEN 10-325 MG PO TABS: 1.0000 | ORAL_TABLET | Freq: Four times a day (QID) | ORAL | 0 refills | Status: DC | PRN

## 2017-04-30 MED ORDER — PHENYLEPHRINE 40 MCG/ML (10ML) SYRINGE FOR IV PUSH (FOR BLOOD PRESSURE SUPPORT)
PREFILLED_SYRINGE | INTRAVENOUS | Status: AC
  Filled 2017-04-30: qty 10

## 2017-04-30 MED ORDER — ONDANSETRON HCL 4 MG/2ML IJ SOLN
INTRAMUSCULAR | Status: AC
  Filled 2017-04-30: qty 2

## 2017-04-30 MED ORDER — DEXAMETHASONE SODIUM PHOSPHATE 10 MG/ML IJ SOLN
INTRAMUSCULAR | Status: DC | PRN
  Administered 2017-04-30: 10 mg via INTRAVENOUS

## 2017-04-30 MED ORDER — HYDROMORPHONE HCL 1 MG/ML IJ SOLN: 0.2500 mg | INTRAMUSCULAR | Status: DC | PRN

## 2017-04-30 MED ORDER — SUCCINYLCHOLINE CHLORIDE 200 MG/10ML IV SOSY
PREFILLED_SYRINGE | INTRAVENOUS | Status: AC
  Filled 2017-04-30: qty 10

## 2017-04-30 MED ORDER — MIDAZOLAM HCL 5 MG/5ML IJ SOLN
INTRAMUSCULAR | Status: DC | PRN
  Administered 2017-04-30: 2 mg via INTRAVENOUS

## 2017-04-30 MED ORDER — MIDAZOLAM HCL 2 MG/2ML IJ SOLN
INTRAMUSCULAR | Status: AC
  Filled 2017-04-30: qty 2

## 2017-04-30 MED ORDER — ROPIVACAINE HCL 5 MG/ML IJ SOLN
INTRAMUSCULAR | Status: DC | PRN
  Administered 2017-04-30: 30 mL via PERINEURAL

## 2017-04-30 MED ORDER — LIDOCAINE 2% (20 MG/ML) 5 ML SYRINGE
INTRAMUSCULAR | Status: DC | PRN
  Administered 2017-04-30: 100 mg via INTRAVENOUS

## 2017-04-30 MED ORDER — PROPOFOL 10 MG/ML IV BOLUS
INTRAVENOUS | Status: AC
  Filled 2017-04-30: qty 20

## 2017-04-30 MED ORDER — HYDROCODONE-ACETAMINOPHEN 10-325 MG PO TABS: 1.0000 | ORAL_TABLET | ORAL | 0 refills | Status: DC | PRN

## 2017-04-30 MED ORDER — ROCURONIUM BROMIDE 10 MG/ML (PF) SYRINGE
PREFILLED_SYRINGE | INTRAVENOUS | Status: AC
  Filled 2017-04-30: qty 5

## 2017-04-30 MED ORDER — BUPIVACAINE-EPINEPHRINE (PF) 0.5% -1:200000 IJ SOLN
INTRAMUSCULAR | Status: AC
  Filled 2017-04-30: qty 30

## 2017-04-30 MED ORDER — ROCURONIUM BROMIDE 10 MG/ML (PF) SYRINGE
PREFILLED_SYRINGE | INTRAVENOUS | Status: DC | PRN
  Administered 2017-04-30: 40 mg via INTRAVENOUS

## 2017-04-30 MED ORDER — OXYCODONE HCL 5 MG PO TABS
5.0000 mg | ORAL_TABLET | Freq: Once | ORAL | Status: AC | PRN
  Administered 2017-04-30: 5 mg via ORAL
  Filled 2017-04-30: qty 1

## 2017-04-30 MED ORDER — DEXAMETHASONE SODIUM PHOSPHATE 10 MG/ML IJ SOLN
INTRAMUSCULAR | Status: AC
  Filled 2017-04-30: qty 1

## 2017-04-30 MED ORDER — CLINDAMYCIN PHOSPHATE 900 MG/50ML IV SOLN
900.0000 mg | INTRAVENOUS | Status: AC
  Administered 2017-04-30: 900 mg via INTRAVENOUS
  Filled 2017-04-30: qty 50

## 2017-04-30 MED ORDER — PROPOFOL 10 MG/ML IV BOLUS
INTRAVENOUS | Status: DC | PRN
  Administered 2017-04-30: 120 mg via INTRAVENOUS

## 2017-04-30 MED ORDER — PROMETHAZINE HCL 25 MG/ML IJ SOLN: 6.2500 mg | INTRAMUSCULAR | Status: DC | PRN

## 2017-04-30 MED ORDER — PHENYLEPHRINE HCL 10 MG/ML IJ SOLN
INTRAMUSCULAR | Status: AC
  Filled 2017-04-30: qty 1

## 2017-04-30 MED ORDER — OXYCODONE HCL 5 MG/5ML PO SOLN
5.0000 mg | Freq: Once | ORAL | Status: AC | PRN
  Filled 2017-04-30: qty 5

## 2017-04-30 MED ORDER — SUCCINYLCHOLINE CHLORIDE 200 MG/10ML IV SOSY
PREFILLED_SYRINGE | INTRAVENOUS | Status: DC | PRN
  Administered 2017-04-30: 100 mg via INTRAVENOUS

## 2017-04-30 MED ORDER — LACTATED RINGERS IV SOLN
INTRAVENOUS | Status: DC
  Administered 2017-04-30: 07:00:00 via INTRAVENOUS

## 2017-04-30 MED ORDER — FENTANYL CITRATE (PF) 100 MCG/2ML IJ SOLN
INTRAMUSCULAR | Status: AC
Start: 2017-04-30 — End: ?
  Filled 2017-04-30: qty 2

## 2017-04-30 MED ORDER — SODIUM CHLORIDE 0.9 % IV SOLN
INTRAVENOUS | Status: DC | PRN
  Administered 2017-04-30: 500 mL

## 2017-04-30 MED ORDER — EPHEDRINE SULFATE-NACL 50-0.9 MG/10ML-% IV SOSY
PREFILLED_SYRINGE | INTRAVENOUS | Status: DC | PRN
  Administered 2017-04-30: 15 mg via INTRAVENOUS
  Administered 2017-04-30 (×2): 10 mg via INTRAVENOUS

## 2017-04-30 MED ORDER — SODIUM CHLORIDE 0.9 % IV SOLN
INTRAVENOUS | Status: AC
  Filled 2017-04-30: qty 500000

## 2017-04-30 MED ORDER — CLINDAMYCIN PHOSPHATE 900 MG/50ML IV SOLN
900.0000 mg | Freq: Once | INTRAVENOUS | Status: AC
  Administered 2017-04-30: 900 mg via INTRAVENOUS
  Filled 2017-04-30: qty 50

## 2017-04-30 MED ORDER — BUPIVACAINE-EPINEPHRINE 0.5% -1:200000 IJ SOLN
INTRAMUSCULAR | Status: DC | PRN
  Administered 2017-04-30: 15 mL

## 2017-04-30 MED ORDER — ONDANSETRON HCL 4 MG/2ML IJ SOLN
INTRAMUSCULAR | Status: DC | PRN
  Administered 2017-04-30: 4 mg via INTRAVENOUS

## 2017-04-30 MED ORDER — FENTANYL CITRATE (PF) 100 MCG/2ML IJ SOLN
INTRAMUSCULAR | Status: DC | PRN
  Administered 2017-04-30: 50 ug via INTRAVENOUS

## 2017-04-30 SURGICAL SUPPLY — 44 items
ANCHOR NEEDLE 9/16 CIR SZ 8 (NEEDLE) IMPLANT
ANCHOR PEEK 4.75X19.1 SWLK C (Anchor) ×6 IMPLANT
BAG ZIPLOCK 12X15 (MISCELLANEOUS) ×3 IMPLANT
CLOSURE WOUND 1/2 X4 (GAUZE/BANDAGES/DRESSINGS) ×1
CLOTH 2% CHLOROHEXIDINE 3PK (PERSONAL CARE ITEMS) ×3 IMPLANT
COVER SURGICAL LIGHT HANDLE (MISCELLANEOUS) ×3 IMPLANT
DRAPE POUCH INSTRU U-SHP 10X18 (DRAPES) ×3 IMPLANT
DRSG AQUACEL AG ADV 3.5X 4 (GAUZE/BANDAGES/DRESSINGS) ×3 IMPLANT
DURAPREP 26ML APPLICATOR (WOUND CARE) ×6 IMPLANT
ELECT NEEDLE TIP 2.8 STRL (NEEDLE) ×3 IMPLANT
ELECT REM PT RETURN 15FT ADLT (MISCELLANEOUS) ×3 IMPLANT
GLOVE BIO SURGEON STRL SZ7 (GLOVE) ×3 IMPLANT
GLOVE BIOGEL PI IND STRL 7.0 (GLOVE) ×2 IMPLANT
GLOVE BIOGEL PI IND STRL 7.5 (GLOVE) ×3 IMPLANT
GLOVE BIOGEL PI IND STRL 8 (GLOVE) ×1 IMPLANT
GLOVE BIOGEL PI INDICATOR 7.0 (GLOVE) ×4
GLOVE BIOGEL PI INDICATOR 7.5 (GLOVE) ×6
GLOVE BIOGEL PI INDICATOR 8 (GLOVE) ×2
GLOVE SURG SS PI 7.5 STRL IVOR (GLOVE) ×3 IMPLANT
GLOVE SURG SS PI 8.0 STRL IVOR (GLOVE) ×3 IMPLANT
GOWN STRL REUS W/TWL XL LVL3 (GOWN DISPOSABLE) ×9 IMPLANT
KIT BASIN OR (CUSTOM PROCEDURE TRAY) ×3 IMPLANT
KIT POSITION SHOULDER SCHLEI (MISCELLANEOUS) ×3 IMPLANT
MANIFOLD NEPTUNE II (INSTRUMENTS) ×3 IMPLANT
MARKER SKIN DUAL TIP RULER LAB (MISCELLANEOUS) ×3 IMPLANT
NEEDLE SCORPION MULTI FIRE (NEEDLE) IMPLANT
PACK SHOULDER (CUSTOM PROCEDURE TRAY) ×3 IMPLANT
POSITIONER SURGICAL ARM (MISCELLANEOUS) ×3 IMPLANT
SLING ARM IMMOBILIZER LRG (SOFTGOODS) IMPLANT
SLING ARM IMMOBILIZER MED (SOFTGOODS) ×3 IMPLANT
SLING ULTRA II L (ORTHOPEDIC SUPPLIES) IMPLANT
STRIP CLOSURE SKIN 1/2X4 (GAUZE/BANDAGES/DRESSINGS) ×2 IMPLANT
SUT BONE WAX W31G (SUTURE) IMPLANT
SUT ETHIBOND NAB CT1 #1 30IN (SUTURE) IMPLANT
SUT FIBERWIRE #2 38 T-5 BLUE (SUTURE) ×3
SUT PROLENE 3 0 PS 2 (SUTURE) ×3 IMPLANT
SUT TIGER TAPE 7 IN WHITE (SUTURE) IMPLANT
SUT VIC AB 1-0 CT2 27 (SUTURE) ×3 IMPLANT
SUT VIC AB 2-0 CT2 27 (SUTURE) ×3 IMPLANT
SUTURE FIBERWR #2 38 T-5 BLUE (SUTURE) ×1 IMPLANT
TAPE FIBER 2MM 7IN #2 BLUE (SUTURE) ×3 IMPLANT
TOWEL OR 17X26 10 PK STRL BLUE (TOWEL DISPOSABLE) ×3 IMPLANT
TOWEL OR NON WOVEN STRL DISP B (DISPOSABLE) ×3 IMPLANT
YANKAUER SUCT BULB TIP NO VENT (SUCTIONS) ×3 IMPLANT

## 2017-04-30 NOTE — Transfer of Care (Signed)
Immediate Anesthesia Transfer of Care Note  Patient: Sonya Cook  Procedure(s) Performed: Right shoulder mini open rotator cuff repair (Right Shoulder)  Patient Location: PACU  Anesthesia Type:General  Level of Consciousness: sedated  Airway & Oxygen Therapy: Patient Spontanous Breathing and Patient connected to face mask oxygen  Post-op Assessment: Report given to RN and Post -op Vital signs reviewed and stable  Post vital signs: Reviewed and stable  Last Vitals:  Vitals:   04/30/17 0614  BP: (!) 146/75  Pulse: 68  Resp: 16  Temp: 36.9 C  SpO2: 97%    Last Pain:  Vitals:   04/30/17 0614  TempSrc: Oral  PainSc:       Patients Stated Pain Goal: 4 (70/35/00 9381)  Complications: No apparent anesthesia complications

## 2017-04-30 NOTE — Anesthesia Procedure Notes (Signed)
Procedure Name: Intubation Date/Time: 04/30/2017 7:33 AM Performed by: Lind Covert, CRNA Pre-anesthesia Checklist: Patient identified, Emergency Drugs available, Suction available, Patient being monitored and Timeout performed Patient Re-evaluated:Patient Re-evaluated prior to induction Oxygen Delivery Method: Circle system utilized Preoxygenation: Pre-oxygenation with 100% oxygen Induction Type: IV induction Ventilation: Mask ventilation without difficulty Laryngoscope Size: Mac and 3 Grade View: Grade I Tube type: Oral Tube size: 7.0 mm Number of attempts: 1 Placement Confirmation: ETT inserted through vocal cords under direct vision,  positive ETCO2 and breath sounds checked- equal and bilateral Secured at: 22 cm Tube secured with: Tape Dental Injury: Teeth and Oropharynx as per pre-operative assessment

## 2017-04-30 NOTE — Brief Op Note (Signed)
04/30/2017  8:25 AM  PATIENT:  Sonya Cook  72 y.o. female  PRE-OPERATIVE DIAGNOSIS:  Right rotator cuff tear  POST-OPERATIVE DIAGNOSIS:  Right rotator cuff tear  PROCEDURE:  Procedure(s) with comments: Right shoulder mini open rotator cuff repair (Right) - Interscalene Block  SURGEON:  Surgeon(s) and Role:    Susa Day, MD - Primary  PHYSICIAN ASSISTANT:   ASSISTANTS: [place]   ANESTHESIA:   general  EBL:  min   BLOOD ADMINISTERED:none  DRAINS: none   LOCAL MEDICATIONS USED:  MARCAINE     SPECIMEN:  No Specimen  DISPOSITION OF SPECIMEN:  N/A  COUNTS:  YES  TOURNIQUET:  * No tourniquets in log *  DICTATION: .Other Dictation: Dictation Number [id number]  PLAN OF CARE: Discharge to home after PACU  PATIENT DISPOSITION:  PACU - hemodynamically stable.   Delay start of Pharmacological VTE agent (>24hrs) due to surgical blood loss or risk of bleeding: yes

## 2017-04-30 NOTE — Interval H&P Note (Signed)
History and Physical Interval Note:  04/30/2017 7:16 AM  Sonya Cook  has presented today for surgery, with the diagnosis of Right rotator cuff tear  The various methods of treatment have been discussed with the patient and family. After consideration of risks, benefits and other options for treatment, the patient has consented to  Procedure(s) with comments: Mini open rotator cuff repair right possible patch graft (Right) - 2 hrs as a surgical intervention .  The patient's history has been reviewed, patient examined, no change in status, stable for surgery.  I have reviewed the patient's chart and labs.  Questions were answered to the patient's satisfaction.     Chera Slivka C

## 2017-04-30 NOTE — Discharge Instructions (Signed)
Aquacel dressing may remain in place until follow up. May shower with aquacel dressing in place. If the dressing becomes saturated or peels off, you may remove it and place a new dressing with gauze and tape which should be kept clean and dry and changed daily. Use sling at times except when exercising or showering No driving for 4-6 weeks No lifting for 6 weeks operative arm Pendulum exercises as instructed. Ok to move wrist, elbow, and hand. See Dr. Tonita Cong in 10-14 days. Take one aspirin per day with a meal if not on a blood thinner or allergic to aspirin.     Surgery for Rotator Cuff Tear, Care After Refer to this sheet in the next few weeks. These instructions provide you with information about caring for yourself after your procedure. Your health care provider may also give you more specific instructions. Your treatment has been planned according to current medical practices, but problems sometimes occur. Call your health care provider if you have any problems or questions after your procedure. What can I expect after the procedure? After the procedure, it is common to have:  Swelling.  Pain.  Stiffness.  Tenderness.  Follow these instructions at home: If you have a sling:  Wear the sling as told by your health care provider. Remove it only as told by your health care provider.  Loosen the sling if your fingers tingle, become numb, or turn cold and blue.  Do not let your sling get wet if it is not waterproof.  Keep the sling clean. Bathing  Do not take baths, swim, or use a hot tub until your health care provider approves. Ask your health care provider if you can take showers. You may only be allowed to take sponge baths for bathing.  Keep your bandage (dressing) dry until your health care provider says it can be removed. Incision care  Follow instructions from your health care provider about how to take care of your incision. Make sure you: ? Wash your hands with soap  and water before you change your dressing. If soap and water are not available, use hand sanitizer. ? Change your dressing as told by your health care provider. ? Leave stitches (sutures), skin glue, or adhesive strips in place. These skin closures may need to stay in place for 2 weeks or longer. If adhesive strip edges start to loosen and curl up, you may trim the loose edges. Do not remove adhesive strips completely unless your health care provider tells you to do that.  Check your incision area every day for signs of infection. Check for: ? More redness, swelling, or pain. ? More fluid or blood. ? Warmth. ? Pus or a bad smell. Managing pain, stiffness, and swelling   If directed, put ice on your shoulder area. ? Put ice in a plastic bag. ? Place a towel between your skin and the bag. ? Leave the ice on for 20 minutes, 2-3 times a day.  Move your fingers often to avoid stiffness and to lessen swelling.  Raise (elevate) your upper body on pillows when you lie down and when you sleep. ? Do not sleep on the front of your body (abdomen). ? Do not sleep on the side that your surgery was performed on. Driving  Do not drive for 24 hours if you received a medicine to help you relax (sedative) during your procedure.  Do not drive or operate heavy machinery while taking prescription pain medicine.  Ask your health care provider  when it is safe for you to drive. Activity  Do not use your arm to support your body weight until your health care provider approves.  Do not lift or hold anything with your arm until your health care provider approves.  Return to your normal activities as told by your health care provider. Ask your health care provider what activities are safe for you.  Do exercises as told by your health care provider. General instructions   Do not use any tobacco products, such as cigarettes, chewing tobacco, or e-cigarettes. Tobacco can delay healing. If you need help  quitting, ask your health care provider.  Take over-the-counter and prescription medicines only as told by your health care provider.  If you were prescribed an antibiotic medicine, take it as told by your health care provider. Do not stop taking the antibiotic even if you start to feel better.  Keep all follow-up visits as told by your health care provider. This is important. Contact a health care provider if:  You have a fever.  You have more redness, swelling, or pain around your incision.  You have more fluid or blood coming from your incision.  Your incision feels warm to the touch.  You have pus or a bad smell coming from your incision.  You have pain that gets worse or does not get better with medicine. Get help right away if:  You have severe pain.  You lose feeling in your arm or hand.  Your hand or fingers turn very pale or blue. This information is not intended to replace advice given to you by your health care provider. Make sure you discuss any questions you have with your health care provider. Document Released: 03/17/2005 Document Revised: 11/21/2015 Document Reviewed: 03/31/2015 Elsevier Interactive Patient Education  2018 [place].   Interscalene Nerve Block, Care After     This sheet gives you information about how to care for yourself after your procedure. Your health care provider may also give you more specific instructions. If you have problems or questions, contact your health care provider. What can I expect after the procedure? After the procedure, it is common to have:  Soreness or tenderness in your neck.  Numbness in your shoulder, upper arm, and some fingers.  Weakness in your shoulder and arm muscles.  The feeling and strength in your shoulder, arm, and fingers should return to normal within hours after your procedure. Follow these instructions at home: For at least 24 hours after the procedure:  Do not: ? Participate in activities  in which you could fall or become injured. ? Drive. ? Use heavy machinery. ? Drink alcohol. ? Take sleeping pills or medicines that cause drowsiness. ? Make important decisions or sign legal documents. ? Take care of children on your own.  Rest. Eating and drinking  If you vomit, drink water, juice, or soup when you can drink without vomiting.  Make sure you have little or no nausea before eating solid foods.  Follow the diet that is recommended by your health care provider. If you have a sling:  Wear it as told by your health care provider. Remove it only as told by your health care provider.  Loosen the sling if your fingers tingle, become numb, or turn cold and blue.  Make sure that your entire arm, including your wrist, is supported. Do not allow your wrist to dangle over the end of the sling.  Do not let your sling get wet if it is  not waterproof.  Keep the sling clean. Bathing  Do not take baths, swim, or use a hot tub until your health care provider approves.  If you have a nerve block catheter in place, keep the incision site and tubing dry. Injection site care  Wash your hands with soap and water before you change your bandage (dressing). If soap and water are not available, use hand sanitizer.  Change your dressing as told by your health care provider.  Keep your dressing dry.  Check your nerve block injection site every day for signs of infection. Check for: ? Redness, swelling, or pain. ? Fluid or blood. ? Warmth. Activity  Do not perform complex or risky activities while taking prescription pain medicine and until you have fully recovered.  Return to your normal activities as told by your health care provider and as you can tolerate them. Ask your health care provider what activities are safe for you.  Rest and take it easy. This will help you heal and recover more quickly and fully.  Be very cautious until you have regained strength and  sensation. General instructions  Have a responsible adult stay with you until you are awake and alert.  Do not drive or use heavy machinery while taking prescription pain medicine and until you have fully recovered. Ask your health care provider when it is safe to drive.  Take over-the-counter and prescription medicines only as told by your health care provider.  If you smoke, do not smoke without supervision.  Do not expose your arm or shoulder to very cold or very hot temperatures until you have full feeling back.  If you have a nerve block catheter in place: ? Try to keep the catheter from getting kinked or pinched. ? Avoid pulling or tugging on the catheter.  Keep all follow-up visits as told by your health care provider. This is important. Contact a health care provider if:  You have chills or fever.  You have redness, swelling, or pain around your injection site.  You have fluid or blood coming from the injection site.  The skin around the injection site is warm to the touch.  There is a bad smell coming from your dressing.  You have hoarseness or a drooping or dry eye that lasts more than a few days.  You have pain that is poorly controlled with the block or with pain medicine.  You have numbness, tingling, or weakness in your shoulder or arm that lasts for more than one week. Get help right away if:  You have severe pain.  You lose or do not regain strength and sensation in your arm even after the nerve block medicine has stopped.  You have trouble breathing.  You have a nerve block catheter still in place and you begin to shiver.  You have a nerve block catheter still in place and you are getting more and more numb or weak. This information is not intended to replace advice given to you by your health care provider. Make sure you discuss any questions you have with your health care provider. Document Released: 03/09/2015 Document Revised: 11/16/2015 Document  Reviewed: 11/16/2015 Elsevier Interactive Patient Education  [employer].

## 2017-04-30 NOTE — Anesthesia Postprocedure Evaluation (Signed)
Anesthesia Post Note  Patient: Sonya Cook  Procedure(s) Performed: Right shoulder mini open rotator cuff repair (Right Shoulder)     Patient location during evaluation: PACU Anesthesia Type: Regional and General Level of consciousness: awake and alert Pain management: pain level controlled Vital Signs Assessment: post-procedure vital signs reviewed and stable Respiratory status: spontaneous breathing, nonlabored ventilation and respiratory function stable Cardiovascular status: blood pressure returned to baseline and stable Postop Assessment: no apparent nausea or vomiting Anesthetic complications: no    Last Vitals:  Vitals:   04/30/17 1015 04/30/17 1030  BP: (!) 146/70 136/76  Pulse: 81 81  Resp: 15 16  Temp: 36.9 C 37.1 C  SpO2: 94% 92%    Last Pain:  Vitals:   04/30/17 0945  TempSrc:   PainSc: 0-No pain                 Lynda Rainwater

## 2017-04-30 NOTE — Evaluation (Signed)
Occupational Therapy Evaluation Patient Details Name: Sonya Cook MRN: [id number] DOB: June 30, 1945 Today's Date: 04/30/2017    History of Present Illness s/p R RCR.  H/O back pain, DM, HTN   Clinical Impression   This 72 year old female was admitted for the above sx. All education was completed.  Pt has back pain but was able to perform passive pendulums with minimal forward bending. She will follow up with Dr Sonya Cook    Follow Up Recommendations  DC plan and follow up therapy as arranged by surgeon    Equipment Recommendations  None recommended by OT    Recommendations for Other Services       Precautions / Restrictions Precautions Precautions: Shoulder Type of Shoulder Precautions: pendulums OK; sling on except for bathing/dressing and exercise Precaution Booklet Issued: Yes (comment) Restrictions Weight Bearing Restrictions: Yes Other Position/Activity Restrictions: NWB      Mobility Bed Mobility               General bed mobility comments: min A to get OOB; min guard for back to bed  Transfers                 General transfer comment: supervision; no AD    Balance                                           ADL either performed or assessed with clinical judgement   ADL Overall ADL's : Needs assistance/impaired Eating/Feeding: Independent   Grooming: Set up;Sitting   Upper Body Bathing: Moderate assistance;Sitting   Lower Body Bathing: Moderate assistance;Sit to/from stand   Upper Body Dressing : Maximal assistance;Sitting   Lower Body Dressing: Maximal assistance;Sit to/from stand   Toilet Transfer: Supervision/safety   Toileting- Water quality scientist and Hygiene: Minimal assistance;Sit to/from stand         General ADL Comments: husband and son present. Reviewed shoulder protocol and worked on pendulum exercises. Pt has back pain but was able to perform these modified (less bending forward). She states that she will  not hurt her back doing them.  she will have to see if recliner is easier on her shoulder/back vs bed.  See education section of chart     Vision         Perception     Praxis      Pertinent Vitals/Pain Pain Assessment: 0-10 Pain Score: 5  Pain Location: back; shoulder 2; ice used Pain Intervention(s): Limited activity within patient's tolerance;Premedicated before session;Monitored during session;Repositioned     Hand Dominance     Extremity/Trunk Assessment Upper Extremity Assessment Upper Extremity Assessment: RUE deficits/detail(immobilized; can move wrist/hand; elbow = hard to move)           Communication Communication Communication: No difficulties   Cognition Arousal/Alertness: Awake/alert Behavior During Therapy: WFL for tasks assessed/performed Overall Cognitive Status: Within Functional Limits for tasks assessed                                     General Comments       Exercises     Shoulder Instructions      Home Living Family/patient expects to be discharged to:: Private residence Living Arrangements: Spouse/significant other Available Help at Discharge: Family  Bathroom Shower/Tub: Teacher, early years/pre: Standard     Home Equipment: Grab bars - tub/shower   Additional Comments: pt does not use RUE for support in getting up      Prior Functioning/Environment Level of Independence: Independent                 OT Problem List:        OT Treatment/Interventions:      OT Goals(Current goals can be found in the care plan section) Acute Rehab OT Goals Patient Stated Goal: have a good recovery OT Goal Formulation: All assessment and education complete, DC therapy  OT Frequency:     Barriers to D/C:            Co-evaluation              AM-PAC PT "6 Clicks" Daily Activity     Outcome Measure Help from another person eating meals?: None Help from another person taking care  of personal grooming?: A Little Help from another person toileting, which includes using toliet, bedpan, or urinal?: A Little Help from another person bathing (including washing, rinsing, drying)?: A Lot Help from another person to put on and taking off regular upper body clothing?: A Lot Help from another person to put on and taking off regular lower body clothing?: A Lot 6 Click Score: 16   End of Session Nurse Communication: (OT completed)  Activity Tolerance: Patient tolerated treatment well Patient left: in bed;with call bell/phone within reach;with family/visitor present  OT Visit Diagnosis: Pain Pain - Right/Left: Right Pain - part of body: Shoulder                Time: 3428-7681 OT Time Calculation (min): 33 min Charges:  OT General Charges $OT Visit: 1 Visit OT Evaluation $OT Eval Low Complexity: 1 Low OT Treatments $Self Care/Home Management : 8-22 mins G-Codes:     [place], OTR/L [phone] 04/30/2017  Sonya Cook 04/30/2017, 11:35 AM

## 2017-04-30 NOTE — Progress Notes (Signed)
Pt has OT at bedside.

## 2017-04-30 NOTE — Anesthesia Preprocedure Evaluation (Signed)
Anesthesia Evaluation  Patient identified by MRN, date of birth, ID band Patient awake    Reviewed: Allergy & Precautions, NPO status , Patient's Chart, lab work & pertinent test results  History of Anesthesia Complications (+) PONV  Airway Mallampati: II  TM Distance: >3 FB Neck ROM: Full    Dental no notable dental hx.    Pulmonary neg pulmonary ROS, former smoker,    Pulmonary exam normal breath sounds clear to auscultation       Cardiovascular hypertension, Pt. on medications negative cardio ROS Normal cardiovascular exam Rhythm:Regular Rate:Normal     Neuro/Psych Anxiety Depression negative neurological ROS  negative psych ROS   GI/Hepatic negative GI ROS, Neg liver ROS, GERD  ,  Endo/Other  negative endocrine ROSdiabetes, Type 2  Renal/GU Renal InsufficiencyRenal diseasenegative Renal ROS  negative genitourinary   Musculoskeletal negative musculoskeletal ROS (+)   Abdominal   Peds negative pediatric ROS (+)  Hematology negative hematology ROS (+)   Anesthesia Other Findings   Reproductive/Obstetrics negative OB ROS                             Anesthesia Physical Anesthesia Plan  ASA: III  Anesthesia Plan: General and Regional   Post-op Pain Management:  Regional for Post-op pain   Induction: Intravenous  PONV Risk Score and Plan: 4 or greater and Ondansetron, Dexamethasone, Midazolam and Scopolamine patch - Pre-op  Airway Management Planned: Oral ETT  Additional Equipment:   Intra-op Plan:   Post-operative Plan: Extubation in OR  Informed Consent: I have reviewed the patients History and Physical, chart, labs and discussed the procedure including the risks, benefits and alternatives for the proposed anesthesia with the patient or authorized representative who has indicated his/her understanding and acceptance.   Dental advisory given  Plan Discussed with:  CRNA  Anesthesia Plan Comments:         Anesthesia Quick Evaluation

## 2017-04-30 NOTE — Anesthesia Procedure Notes (Signed)
Anesthesia Regional Block: Interscalene brachial plexus block   Pre-Anesthetic Checklist: ,, timeout performed, Correct Patient, Correct Site, Correct Laterality, Correct Procedure, Correct Position, site marked, Risks and benefits discussed,  Surgical consent,  Pre-op evaluation,  At surgeon's request and post-op pain management  Laterality: Right  Prep: chloraprep       Needles:  Injection technique: Single-shot  Needle Type: Stimiplex     Needle Length: 9cm  Needle Gauge: 21     Additional Needles:   Procedures:,,,, ultrasound used (permanent image in chart),,,,  Narrative:  Start time: 04/30/2017 7:17 AM End time: 04/30/2017 7:22 AM Injection made incrementally with aspirations every 5 mL.  Performed by: Personally  Anesthesiologist: Lynda Rainwater, MD

## 2017-05-01 ENCOUNTER — Encounter (HOSPITAL_COMMUNITY): Payer: Self-pay | Admitting: Specialist

## 2017-05-01 NOTE — Op Note (Signed)
NAME:  Sonya Cook, Sonya Cook                    ACCOUNT NO.:  MEDICAL RECORD NO.:  [id number]  LOCATION:                                 FACILITY:  PHYSICIAN:  Susa Day, M.D.         DATE OF BIRTH:  DATE OF PROCEDURE:  04/30/2017 DATE OF DISCHARGE:                              OPERATIVE REPORT   PREOPERATIVE DIAGNOSIS:  Massive retracted tear of the rotator cuff.  POSTOPERATIVE DIAGNOSIS:  Massive retracted tear of the rotator cuff.  PROCEDURE PERFORMED:  Mini open rotator cuff repair, subacromial decompression, acromioplasty of the right shoulder.  ANESTHESIA:  General.  ASSISTANT:  Cleophas Dunker, PA.  HISTORY:  A 72 year old with retracted tear of the rotator cuff, supraspinatus, infraspinatus, fairly acute, no fatty atrophy. Significant pain, indicated for repair.  We discussed risks and benefits including bleeding, infection, damage to neurovascular structures, no change in symptoms, worsening symptoms, DVT, PE, anesthetic complications, etc.  Also, the inability to repair, the tendon will require a patch graft or shoulder replacement in the future.  TECHNIQUE:  With the patient in supine beach chair position, after induction of adequate general anesthesia, a block.  The right upper extremity was prepped and draped in usual sterile fashion.  Surgical marker was utilized to delineate the acromion, AC joint, and coracoid. A 2 cm incision made over the anterolateral aspect of the acromion. Subcutaneous tissue was dissected.  Electrocautery was utilized to achieve hemostasis.  Rhaphe between the anterolateral heads of the deltoid were identified, divided in line with the skin incision.  We preserved an attachment of the deltoid, partially released CA ligament. A small spur of the anterolateral aspect of the acromion was removed with a 3 mm Kerrison.  Synovial fluid clear was evacuated.  We lavaged the joint.  A self-retaining retractor was placed.  A severe tear of  the rotator cuff was noted.  There was a portion of the subscap supraspinatus that was retracted toward the glenohumeral joint and anteriorly, infraspinatus posteriorly.  I was able to mobilize the subscap supraspinatus tear and advance it posterolaterally approximately a centimeter and a half.  I fashioned a trough in the greater tuberosity with a Beyer rongeur.  Placed a SwiveLock suture anchor within the trough.  FiberTape was utilized and I threaded it through the supraspinatus subscap tear, advancing it posterolaterally making sure not to incorporate the biceps tendon, which was intact.  This was then pulled over the lateral aspect of the greater tuberosity posterolaterally and placed in the second SwiveLock after placing the appropriate pilot hole with an awl and insertion of the SwiveLock.  The first SwiveLock had excellent resistance to pullout as did the second. We did this without undue tension securing this flap.  Next, we began mobilizing the residual of the infraspinatus and the posterior half of supraspinatus, however, with multiple attempts, there was fragmentation of the remaining tendon.  This was felt after this to be non advanceable, therefore, non repairable.  We copiously irrigated the joint and again meticulously reinspected and searched for repairable portions of the posterior half of the supraspinatus as well as the infraspinatus and again they were unidentified.  I felt that therefore this was a non repairable portion of the tendon.  I copiously irrigated the wound.  We had placed 0.25% Marcaine with epinephrine in the joint for hemostasis.  We had strict hemostasis and I then closed the dorsolumbar fascia with 1 Vicryl, subcu with 2, and skin with Prolene. Sterile dressing applied.  Placed in a sling, extubated without difficulty, and transported to the recovery room in satisfactory condition.  The patient tolerated the procedure well.  No complications.   Assistant, Cleophas Dunker, [place].  Minimal blood loss.     Susa Day, M.D.     Geralynn Rile  D:  04/30/2017  T:  04/30/2017  Job:  [id number]

## 2017-05-15 ENCOUNTER — Ambulatory Visit: Payer: Medicare Other | Admitting: Family

## 2017-05-26 ENCOUNTER — Ambulatory Visit: Payer: Medicare Other | Attending: Specialist | Admitting: Physical Therapy

## 2017-05-26 ENCOUNTER — Other Ambulatory Visit: Payer: Self-pay | Admitting: Family

## 2017-05-26 ENCOUNTER — Other Ambulatory Visit: Payer: Self-pay

## 2017-05-26 DIAGNOSIS — M25511 Pain in right shoulder: Secondary | ICD-10-CM | POA: Diagnosis not present

## 2017-05-26 DIAGNOSIS — M6281 Muscle weakness (generalized): Secondary | ICD-10-CM

## 2017-05-26 DIAGNOSIS — M25611 Stiffness of right shoulder, not elsewhere classified: Secondary | ICD-10-CM | POA: Insufficient documentation

## 2017-05-26 NOTE — Therapy (Signed)
[facility] [place], [place], [address] Phone: [phone]   Fax:  [phone]  Physical Therapy Evaluation  Patient Details  Name: Sonya Cook MRN: [id number] Date of Birth: 1945/07/31 Referring Provider: Susa Day, MD   Encounter Date: 05/26/2017  PT End of Session - 05/26/17 1124    Visit Number  1    Number of Visits  16    Date for PT Re-Evaluation  07/21/17    Authorization Type  COMPLETE FOTO AT 2ND VISIT THEN EVERY 5TH VISIT.    PT Start Time  1126    PT Stop Time  1204    PT Time Calculation (min)  38 min    Activity Tolerance  Patient tolerated treatment well    Behavior During Therapy  WFL for tasks assessed/performed       Past Medical History:  Diagnosis Date  . Anxiety   . Chronic kidney disease   . Depression   . Diabetes mellitus without complication ([place])    type II   . Dyslipidemia   . Family history of adverse reaction to anesthesia    daughter - slow to wake up   . GERD (gastroesophageal reflux disease)   . Hiatal hernia   . Hyperlipidemia   . Hypertension   . Internal hemorrhoids   . Osteopenia   . PONV (postoperative nausea and vomiting)   . Vitamin D deficiency     Past Surgical History:  Procedure Laterality Date  . ABDOMINAL HYSTERECTOMY    . LUMBAR DISC SURGERY    . right hand surgery for cyst     . SHOULDER OPEN ROTATOR CUFF REPAIR Right 04/30/2017   Procedure: Right shoulder mini open rotator cuff repair;  Surgeon: Susa Day, MD;  Location: [facility];  Service: Orthopedics;  Laterality: Right;  Interscalene Block    There were no vitals filed for this visit.   Subjective Assessment - 05/26/17 1119    Subjective  Patient had R RCR on 04/30/17. Patient had tore it lifting groceries out of the car. Her MRI showed full thickness tears in supra and infraspinatus tendons and that the muscles were torn as well. Subscapularis and TM are intact.  Patient reports that she  tripped on a step and fell after the surgery, but did not fall on her R side.  She also reports that Dr. Tonita Cong said she could raise her arm overhead actively or using LUE assist.    Pertinent History  DM, CKD, depression, HTN, Osteopenia, lumbar disc surgery, left hip and leg pain    Diagnostic tests  MRI - full thickness tears in supra and infraspinatus tendons and the muscles were torn as well. Subscapularis and TM are intact.    Patient Stated Goals  regain maximum movement    Currently in Pain?  Yes    Pain Score  2     Pain Location  Shoulder    Pain Orientation  Right    Pain Type  Surgical pain    Pain Onset  1 to 4 weeks ago    Pain Frequency  Intermittent    Aggravating Factors   movement up and back    Pain Relieving Factors  ice    Effect of Pain on Daily Activities  limited         [facility] PT Assessment - 05/26/17 0001      Assessment   Medical Diagnosis  s/p R RCR (postoperative care for other specified surgical aftercare)  Referring Provider  Susa Day, MD    Onset Date/Surgical Date  04/30/17    Hand Dominance  Right    Next MD Visit  06/25/17    Prior Therapy  no      Precautions   Precautions  Shoulder    Type of Shoulder Precautions  RCR    Required Braces or Orthoses  Sling      Balance Screen   Has the patient fallen in the past 6 months  Yes    How many times?  1    Has the patient had a decrease in activity level because of a fear of falling?   No    Is the patient reluctant to leave their home because of a fear of falling?   No      Home Film/video editor residence    Living Arrangements  Spouse/significant other      Prior Function   Level of Independence  Independent      Posture/Postural Control   Posture/Postural Control  Postural limitations    Posture Comments  tight R pectorals      ROM / Strength   AROM / PROM / Strength  PROM;AROM      AROM   Overall AROM Comments  Cervical WFL; some tightness with left  rotation and SB      PROM   PROM Assessment Site  Shoulder    Right/Left Shoulder  Right    Right Shoulder Flexion  130 Degrees    Right Shoulder ABduction  110 Degrees    Right Shoulder Internal Rotation  84 Degrees at 45 deg    Right Shoulder External Rotation  40 Degrees at 45 deg ABD             Objective measurements completed on examination: See above findings.              PT Education - 05/26/17 1210    Education provided  Yes    Education Details  HEP; shoulder precautions reviewed    Person(s) Educated  Patient    Methods  Explanation;Demonstration;Handout    Comprehension  Verbalized understanding;Returned demonstration       PT Short Term Goals - 05/26/17 1222      PT SHORT TERM GOAL #1   Title  I with HEP    Time  2    Period  Weeks    Status  New    Target Date  06/09/17      PT SHORT TERM GOAL #2   Title  Patient to demo full R shoulder AROM = to L shoulder    Time  4    Period  Weeks    Status  New    Target Date  06/23/17        PT Long Term Goals - 05/26/17 1226      PT LONG TERM GOAL #1   Title  I with Advanced HEP    Time  8    Period  Weeks    Status  New    Target Date  07/21/17      PT LONG TERM GOAL #2   Title  Patient to demo 4+/5 strength or better in the R shoulder.    Time  8    Period  Weeks    Status  New      PT LONG TERM GOAL #3   Title  Patient able to peform ADLS with 2/77 pain  or less in the right shoulder    Time  8    Period  Weeks    Status  New    Target Date  07/21/17             Plan - 05/26/17 1210    Clinical Impression Statement  Patient presents for a low complexity evaluation for s/p R RCR. She has limited ROM and strength and intermittent pain affecting ADLS. Patient will benefit from skilled PT to address these deficits.    Clinical Presentation  Stable    Clinical Decision Making  Low    Rehab Potential  Excellent    Clinical Impairments Affecting Rehab Potential  multiple  comorbidities    PT Frequency  2x / week    PT Duration  8 weeks    PT Treatment/Interventions  ADLs/Self Care Home Management;Electrical Stimulation;Cryotherapy;Therapeutic exercise;Manual techniques;Patient/family education;Neuromuscular re-education;Passive range of motion;Vasopneumatic Device    PT Next Visit Plan  PLEASE COMPLETE A FOTO; PROM within limits and progress per protocol for RCR (in book).     PT Home Exercise Plan  sling exercises, pendulums, scap retraction    Consulted and Agree with Plan of Care  Patient       Patient will benefit from skilled therapeutic intervention in order to improve the following deficits and impairments:  Pain, Impaired UE functional use, Decreased range of motion, Postural dysfunction  Visit Diagnosis: Stiffness of right shoulder, not elsewhere classified - Plan: PT plan of care cert/re-cert  Acute pain of right shoulder - Plan: PT plan of care cert/re-cert  Muscle weakness (generalized) - Plan: PT plan of care cert/re-cert     Problem List Patient Active Problem List   Diagnosis Date Noted  . Rotator cuff tear, right 04/30/2017  . Aortic atherosclerosis ([place]) 04/09/2017  . Chronic back pain 11/13/2016  . GERD (gastroesophageal reflux disease) 11/02/2012  . Depression 08/30/2012  . Osteopenia 08/30/2012  . Diabetes ([place]) 08/30/2012  . Hyperlipidemia 08/30/2012  . Renal insufficiency 08/30/2012    Verlene Mayer PT 05/26/2017, 12:36 PM  [facility] [place], [place], [address] Phone: [phone]   Fax:  [phone]  Name: DORTHIE SANTINI MRN: [id number] Date of Birth: [birth year]

## 2017-05-26 NOTE — Patient Instructions (Signed)
ROM: Pendulum (Circular)  Let right arm move in circle clockwise, then counterclockwise, by rocking body weight in circular pattern. Circle _10___ times each direction per set. Do _3___ sessions per day.  Pendulum Side to Side  Bend forward 90 at waist, leaning on table for support. Rock body from side to side and let arm swing freely. Repeat _10___ times. Do __3__ sessions per day.  Finger Flexors  Keeping right fingertips straight, press putty toward base of palm. Repeat __20__ times. Do __3__ sessions per day. Activity: Squeeze flour sifter, plastic squeeze bottles, [place] baster, juice from fruit.*  AROM: Elbow Flexion / Extension  With left hand palm up, gently bend elbow as far as possible. Then straighten arm as far as possible. Repeat __10__ times per set. Do __3__ sessions per day.   Place hands on table, elbows straight. Move hips away from body. Press hands down into table. Hold _3__ seconds. _10__ reps per set, __3_ sets per day. Posture - Sitting   Sit upright, head facing forward. Try using a roll to support lower back. Keep shoulders relaxed, and avoid rounded back. Keep hips level with knees. Avoid crossing legs for long periods.  SHOULDER: Flexion At Wall    Slide your right hand up the wall to edge of pain.  Maintain upright posture and tuck in stomach. Hold _2-3__ seconds. _10-20__ reps per set, 3__ sets per day, ___ days per week  Copyright  VHI. All rights reserved.   Madelyn Flavors, PT 05/26/17 11:56 AM; [facility] [place], [place], [address] Phone: [phone]   Fax:  [phone]

## 2017-05-29 ENCOUNTER — Ambulatory Visit: Payer: Medicare Other | Attending: Specialist | Admitting: Physical Therapy

## 2017-05-29 ENCOUNTER — Encounter: Payer: Self-pay | Admitting: Physical Therapy

## 2017-05-29 DIAGNOSIS — M6281 Muscle weakness (generalized): Secondary | ICD-10-CM | POA: Diagnosis not present

## 2017-05-29 DIAGNOSIS — M25511 Pain in right shoulder: Secondary | ICD-10-CM | POA: Insufficient documentation

## 2017-05-29 DIAGNOSIS — M25611 Stiffness of right shoulder, not elsewhere classified: Secondary | ICD-10-CM | POA: Insufficient documentation

## 2017-05-29 NOTE — Therapy (Signed)
[facility] [place], [place], [address] Phone: [phone]   Fax:  [phone]  Physical Therapy Treatment  Patient Details  Name: Sonya Cook MRN: [id number] Date of Birth: [birth year] Referring Provider: Susa Day, MD   Encounter Date: 05/29/2017  PT End of Session - 05/29/17 1331    Visit Number  2    Number of Visits  16    Date for PT Re-Evaluation  07/21/17    Authorization Type  COMPLETE FOTO AT 2ND VISIT THEN EVERY 5TH VISIT.    PT Start Time  1117    PT Stop Time  1204    PT Time Calculation (min)  47 min    Activity Tolerance  Patient tolerated treatment well    Behavior During Therapy  WFL for tasks assessed/performed       Past Medical History:  Diagnosis Date  . Anxiety   . Chronic kidney disease   . Depression   . Diabetes mellitus without complication ([place])    type II   . Dyslipidemia   . Family history of adverse reaction to anesthesia    daughter - slow to wake up   . GERD (gastroesophageal reflux disease)   . Hiatal hernia   . Hyperlipidemia   . Hypertension   . Internal hemorrhoids   . Osteopenia   . PONV (postoperative nausea and vomiting)   . Vitamin D deficiency     Past Surgical History:  Procedure Laterality Date  . ABDOMINAL HYSTERECTOMY    . LUMBAR DISC SURGERY    . right hand surgery for cyst     . SHOULDER OPEN ROTATOR CUFF REPAIR Right 04/30/2017   Procedure: Right shoulder mini open rotator cuff repair;  Surgeon: Susa Day, MD;  Location: [facility];  Service: Orthopedics;  Laterality: Right;  Interscalene Block    There were no vitals filed for this visit.  Subjective Assessment - 05/29/17 1337    Subjective  The exercise going up the wall hurt.  Recommended she avoid at this time and she was instructed in a passive countertop stretch instead.    Currently in Pain?  Yes    Pain Score  2     Pain Location  Shoulder    Pain Orientation  Right    Pain Descriptors  / Indicators  Aching;Discomfort;Dull    Pain Type  Surgical pain    Pain Onset  1 to 4 weeks ago                      [facility] Adult PT Treatment/Exercise - 05/29/17 0001      Modalities   Modalities  Vasopneumatic      Vasopneumatic   Number Minutes Vasopneumatic   15 minutes    Vasopnuematic Location   -- Right shoulder with pillow btw rt elbow and thorax.    Vasopneumatic Pressure  Low      Manual Therapy   Manual Therapy  Passive ROM    Passive ROM  In supine:  Gentle right shoulder PROM into flexion and ER x 23 minutes.             PT Education - 05/29/17 1332    Education Details  HEP.    Methods  Explanation;Demonstration;Handout    Comprehension  Verbalized understanding;Returned demonstration;Verbal cues required;Need further instruction       PT Short Term Goals - 05/26/17 1222      PT SHORT TERM GOAL #1  Title  I with HEP    Time  2    Period  Weeks    Status  New    Target Date  06/09/17      PT SHORT TERM GOAL #2   Title  Patient to demo full R shoulder AROM = to L shoulder    Time  4    Period  Weeks    Status  New    Target Date  06/23/17        PT Long Term Goals - 05/26/17 1226      PT LONG TERM GOAL #1   Title  I with Advanced HEP    Time  8    Period  Weeks    Status  New    Target Date  07/21/17      PT LONG TERM GOAL #2   Title  Patient to demo 4+/5 strength or better in the R shoulder.    Time  8    Period  Weeks    Status  New      PT LONG TERM GOAL #3   Title  Patient able to peform ADLS with 9/93 pain or less in the right shoulder    Time  8    Period  Weeks    Status  New    Target Date  07/21/17            Plan - 05/29/17 1340    Clinical Impression Statement  The patient did very well today with passive right shoulder range of motion.  She did well with passive home exercises (supine cane exercise for ER and countertop stretch for flexion).  FOTO limitation is 87%.    Clinical Decision Making   Low    PT Treatment/Interventions  ADLs/Self Care Home Management;Electrical Stimulation;Cryotherapy;Therapeutic exercise;Manual techniques;Patient/family education;Neuromuscular re-education;Passive range of motion;Vasopneumatic Device    PT Next Visit Plan  PLEASE COMPLETE A FOTO; PROM within limits and progress per protocol for RCR (in book).     PT Home Exercise Plan  sling exercises, pendulums, scap retraction       Patient will benefit from skilled therapeutic intervention in order to improve the following deficits and impairments:  Pain, Impaired UE functional use, Decreased range of motion, Postural dysfunction  Visit Diagnosis: Stiffness of right shoulder, not elsewhere classified  Acute pain of right shoulder  Muscle weakness (generalized)     Problem List Patient Active Problem List   Diagnosis Date Noted  . Rotator cuff tear, right 04/30/2017  . Aortic atherosclerosis ([place]) 04/09/2017  . Chronic back pain 11/13/2016  . GERD (gastroesophageal reflux disease) 11/02/2012  . Depression 08/30/2012  . Osteopenia 08/30/2012  . Diabetes ([place]) 08/30/2012  . Hyperlipidemia 08/30/2012  . Renal insufficiency 08/30/2012    APPLEGATE, [place] MPT 05/29/2017, 1:44 PM  [facility] [address] [place], [place], [address] Phone: [phone]   Fax:  [phone]  Name: Sonya Cook MRN: [id number] Date of Birth: [birth year]

## 2017-06-02 ENCOUNTER — Ambulatory Visit: Payer: Medicare Other | Admitting: Physical Therapy

## 2017-06-02 DIAGNOSIS — M25511 Pain in right shoulder: Secondary | ICD-10-CM | POA: Diagnosis not present

## 2017-06-02 DIAGNOSIS — M25611 Stiffness of right shoulder, not elsewhere classified: Secondary | ICD-10-CM | POA: Diagnosis not present

## 2017-06-02 DIAGNOSIS — M6281 Muscle weakness (generalized): Secondary | ICD-10-CM | POA: Diagnosis not present

## 2017-06-02 NOTE — Therapy (Signed)
[facility] [place], [place], [address] Phone: [phone]   Fax:  [phone]  Physical Therapy Treatment  Patient Details  Name: Sonya Cook MRN: [id number] Date of Birth: 1945-11-24 Referring Provider: Susa Day, MD   Encounter Date: 06/02/2017  PT End of Session - 06/02/17 1119    Visit Number  3    Number of Visits  16    Date for PT Re-Evaluation  07/21/17    Authorization Type  COMPLETE FOTO EVERY 5TH VISIT starting at visit 7    PT Start Time  1118    PT Stop Time  1211    PT Time Calculation (min)  53 min    Activity Tolerance  Patient tolerated treatment well    Behavior During Therapy  [facility] for tasks assessed/performed       Past Medical History:  Diagnosis Date  . Anxiety   . Chronic kidney disease   . Depression   . Diabetes mellitus without complication ([place])    type II   . Dyslipidemia   . Family history of adverse reaction to anesthesia    daughter - slow to wake up   . GERD (gastroesophageal reflux disease)   . Hiatal hernia   . Hyperlipidemia   . Hypertension   . Internal hemorrhoids   . Osteopenia   . PONV (postoperative nausea and vomiting)   . Vitamin D deficiency     Past Surgical History:  Procedure Laterality Date  . ABDOMINAL HYSTERECTOMY    . LUMBAR DISC SURGERY    . right hand surgery for cyst     . SHOULDER OPEN ROTATOR CUFF REPAIR Right 04/30/2017   Procedure: Right shoulder mini open rotator cuff repair;  Surgeon: Susa Day, MD;  Location: [facility];  Service: Orthopedics;  Laterality: Right;  Interscalene Block    There were no vitals filed for this visit.  Subjective Assessment - 06/02/17 1120    Subjective  My deltoid muscle has been really sore. I'm doing the slide exercise on the table now which is much better.    Pertinent History  DM, CKD, depression, HTN, Osteopenia, lumbar disc surgery, left hip and leg pain    Diagnostic tests  MRI - full thickness tears  in supra and infraspinatus tendons and the muscles were torn as well. Subscapularis and TM are intact.    Patient Stated Goals  regain maximum movement    Currently in Pain?  Yes    Pain Score  2     Pain Location  Shoulder    Pain Orientation  Right    Pain Descriptors / Indicators  Aching    Pain Type  Surgical pain    Pain Onset  More than a month ago    Pain Frequency  Intermittent    Aggravating Factors   end range    Pain Relieving Factors  ice    Effect of Pain on Daily Activities  limitd with ADLS                      [facility] Adult PT Treatment/Exercise - 06/02/17 0001      Modalities   Modalities  Electrical Stimulation;Cryotherapy      Cryotherapy   Number Minutes Cryotherapy  15 Minutes    Cryotherapy Location  Shoulder    Type of Cryotherapy  Ice pack      Electrical Stimulation   Electrical Stimulation Location  R deltoids and shoulder  Electrical Stimulation Action  premod    Electrical Stimulation Parameters  80-150 Hz x 15 min    Electrical Stimulation Goals  Tone;Pain      Manual Therapy   Manual Therapy  Soft tissue mobilization;Passive ROM    Soft tissue mobilization  to R deltoids    Passive ROM  to R shoulder into flex/IR/ER/ABD within protocol limits             PT Education - 06/02/17 1202    Education provided  Yes    Education Details  self care: MFR using ball to R deltoid muscle    Person(s) Educated  Patient    Methods  Explanation;Demonstration;Verbal cues    Comprehension  Verbalized understanding;Returned demonstration       PT Short Term Goals - 05/26/17 1222      PT SHORT TERM GOAL #1   Title  I with HEP    Time  2    Period  Weeks    Status  New    Target Date  06/09/17      PT SHORT TERM GOAL #2   Title  Patient to demo full R shoulder AROM = to L shoulder    Time  4    Period  Weeks    Status  New    Target Date  06/23/17        PT Long Term Goals - 05/26/17 1226      PT LONG TERM GOAL #1    Title  I with Advanced HEP    Time  8    Period  Weeks    Status  New    Target Date  07/21/17      PT LONG TERM GOAL #2   Title  Patient to demo 4+/5 strength or better in the R shoulder.    Time  8    Period  Weeks    Status  New      PT LONG TERM GOAL #3   Title  Patient able to peform ADLS with 4/40 pain or less in the right shoulder    Time  8    Period  Weeks    Status  New    Target Date  07/21/17            Plan - 06/02/17 1203    Clinical Impression Statement  Patient presented today with significant tone in her R deltoids. She tolerated deep STW to the muscle without complaint and was educated on self MFR and STW using a ball. PT reviewed precautions with patient who is compliant. She did say that she has been picking up stuffed animals and assisting her LUE with lifting some items such as a vase and PT reiterated "no lifting".  LTGs are ongoing.    Rehab Potential  Excellent    Clinical Impairments Affecting Rehab Potential  multiple comorbidities    PT Frequency  2x / week    PT Duration  8 weeks    PT Treatment/Interventions  ADLs/Self Care Home Management;Electrical Stimulation;Cryotherapy;Therapeutic exercise;Manual techniques;Patient/family education;Neuromuscular re-education;Passive range of motion;Vasopneumatic Device    PT Next Visit Plan  Continue with STW to R deltoid as indicated; PROM within limits and progress per protocol for RCR (in book).     PT Home Exercise Plan  sling exercises, pendulums, scap retraction    Consulted and Agree with Plan of Care  Patient       Patient will benefit from skilled therapeutic intervention in order to  improve the following deficits and impairments:  Pain, Impaired UE functional use, Decreased range of motion, Postural dysfunction  Visit Diagnosis: Stiffness of right shoulder, not elsewhere classified  Acute pain of right shoulder     Problem List Patient Active Problem List   Diagnosis Date Noted  .  Rotator cuff tear, right 04/30/2017  . Aortic atherosclerosis ([place]) 04/09/2017  . Chronic back pain 11/13/2016  . GERD (gastroesophageal reflux disease) 11/02/2012  . Depression 08/30/2012  . Osteopenia 08/30/2012  . Diabetes ([place]) 08/30/2012  . Hyperlipidemia 08/30/2012  . Renal insufficiency 08/30/2012   Madelyn Flavors PT 06/02/2017, 12:08 PM  [facility] [place], [place], [address] Phone: [phone]   Fax:  [phone]  Name: Sonya Cook MRN: [id number] Date of Birth: 1945/06/12

## 2017-06-04 ENCOUNTER — Encounter: Payer: Self-pay | Admitting: Physical Therapy

## 2017-06-04 ENCOUNTER — Ambulatory Visit: Payer: Medicare Other | Admitting: Physical Therapy

## 2017-06-04 DIAGNOSIS — M25511 Pain in right shoulder: Secondary | ICD-10-CM

## 2017-06-04 DIAGNOSIS — M25611 Stiffness of right shoulder, not elsewhere classified: Secondary | ICD-10-CM

## 2017-06-04 DIAGNOSIS — M6281 Muscle weakness (generalized): Secondary | ICD-10-CM | POA: Diagnosis not present

## 2017-06-04 NOTE — Therapy (Signed)
[facility] [place], [place], [address] Phone: [phone]   Fax:  [phone]  Physical Therapy Treatment  Patient Details  Name: Sonya Cook MRN: [id number] Date of Birth: 1945-09-14 Referring Provider: Susa Day, MD   Encounter Date: 06/04/2017  PT End of Session - 06/04/17 1300    Visit Number  4    Number of Visits  16    Date for PT Re-Evaluation  07/21/17    Authorization Type  COMPLETE FOTO EVERY 5TH VISIT starting at visit 7    PT Start Time  1117    PT Stop Time  1223    PT Time Calculation (min)  66 min    Activity Tolerance  Patient tolerated treatment well    Behavior During Therapy  [facility] for tasks assessed/performed       Past Medical History:  Diagnosis Date  . Anxiety   . Chronic kidney disease   . Depression   . Diabetes mellitus without complication ([place])    type II   . Dyslipidemia   . Family history of adverse reaction to anesthesia    daughter - slow to wake up   . GERD (gastroesophageal reflux disease)   . Hiatal hernia   . Hyperlipidemia   . Hypertension   . Internal hemorrhoids   . Osteopenia   . PONV (postoperative nausea and vomiting)   . Vitamin D deficiency     Past Surgical History:  Procedure Laterality Date  . ABDOMINAL HYSTERECTOMY    . LUMBAR DISC SURGERY    . right hand surgery for cyst     . SHOULDER OPEN ROTATOR CUFF REPAIR Right 04/30/2017   Procedure: Right shoulder mini open rotator cuff repair;  Surgeon: Susa Day, MD;  Location: [facility];  Service: Orthopedics;  Laterality: Right;  Interscalene Block    There were no vitals filed for this visit.  Subjective Assessment - 06/04/17 1254    Subjective  I'm okay today.    Diagnostic tests  MRI - full thickness tears in supra and infraspinatus tendons and the muscles were torn as well. Subscapularis and TM are intact.    Pain Score  2     Pain Location  Shoulder    Pain Orientation  Right    Pain  Descriptors / Indicators  Aching;Sore    Pain Onset  More than a month ago                      [facility] Adult PT Treatment/Exercise - 06/04/17 0001      Electrical Stimulation   Electrical Stimulation Location  -- Right shoulder.    Electrical Stimulation Action  IFC    Electrical Stimulation Parameters  Non-motoric at 80-150 Hz at 100% scan x 20 minutes.    Electrical Stimulation Goals  Tone;Pain      Vasopneumatic   Number Minutes Vasopneumatic   20 minutes    Vasopnuematic Location   -- Right shoulder.    Vasopneumatic Pressure  Low Pillow between right elbow and thorax.      Manual Therapy   Manual Therapy  Passive ROM    Passive ROM  In supine:  Gentle right shoulder PROM with low load long duration stretching x 38 minutes into flexion, ER andIR and small circle rhy stabs at 90 degrees of flexion.               PT Short Term Goals - 05/26/17 1222  PT SHORT TERM GOAL #1   Title  I with HEP    Time  2    Period  Weeks    Status  New    Target Date  06/09/17      PT SHORT TERM GOAL #2   Title  Patient to demo full R shoulder AROM = to L shoulder    Time  4    Period  Weeks    Status  New    Target Date  06/23/17        PT Long Term Goals - 05/26/17 1226      PT LONG TERM GOAL #1   Title  I with Advanced HEP    Time  8    Period  Weeks    Status  New    Target Date  07/21/17      PT LONG TERM GOAL #2   Title  Patient to demo 4+/5 strength or better in the R shoulder.    Time  8    Period  Weeks    Status  New      PT LONG TERM GOAL #3   Title  Patient able to peform ADLS with 4/16 pain or less in the right shoulder    Time  8    Period  Weeks    Status  New    Target Date  07/21/17            Plan - 06/04/17 1258    Clinical Impression Statement  The patient did great today.  She is doing supine punches at home per surgeon okay.  Also showed her supine flexion performed with patient grasping left hand to right wrist.   She reports her shoulder flet very good after treatment.       Patient will benefit from skilled therapeutic intervention in order to improve the following deficits and impairments:     Visit Diagnosis: Stiffness of right shoulder, not elsewhere classified  Acute pain of right shoulder  Muscle weakness (generalized)     Problem List Patient Active Problem List   Diagnosis Date Noted  . Rotator cuff tear, right 04/30/2017  . Aortic atherosclerosis ([place]) 04/09/2017  . Chronic back pain 11/13/2016  . GERD (gastroesophageal reflux disease) 11/02/2012  . Depression 08/30/2012  . Osteopenia 08/30/2012  . Diabetes ([place]) 08/30/2012  . Hyperlipidemia 08/30/2012  . Renal insufficiency 08/30/2012    APPLEGATE, [place] MPT 06/04/2017, 1:01 PM  [facility] [address] [place], [place], [address] Phone: [phone]   Fax:  [phone]  Name: Sonya Cook MRN: [id number] Date of Birth: 1945-07-09

## 2017-06-09 ENCOUNTER — Ambulatory Visit: Payer: Medicare Other | Admitting: Physical Therapy

## 2017-06-09 DIAGNOSIS — M6281 Muscle weakness (generalized): Secondary | ICD-10-CM

## 2017-06-09 DIAGNOSIS — M25511 Pain in right shoulder: Secondary | ICD-10-CM

## 2017-06-09 DIAGNOSIS — M25611 Stiffness of right shoulder, not elsewhere classified: Secondary | ICD-10-CM

## 2017-06-09 NOTE — Therapy (Signed)
[facility] [place], [place], [address] Phone: [phone]   Fax:  [phone]  Physical Therapy Treatment  Patient Details  Name: Sonya Cook MRN: [id number] Date of Birth: 1946-01-10 Referring Provider: Susa Day, MD   Encounter Date: 06/09/2017  PT End of Session - 06/09/17 1238    Visit Number  5    Number of Visits  16    Date for PT Re-Evaluation  07/21/17    Authorization Type  COMPLETE FOTO EVERY 5TH VISIT starting at visit 7    PT Start Time  1115    PT Stop Time  1210    PT Time Calculation (min)  55 min    Activity Tolerance  Patient tolerated treatment well    Behavior During Therapy  [facility] for tasks assessed/performed       Past Medical History:  Diagnosis Date  . Anxiety   . Chronic kidney disease   . Depression   . Diabetes mellitus without complication ([place])    type II   . Dyslipidemia   . Family history of adverse reaction to anesthesia    daughter - slow to wake up   . GERD (gastroesophageal reflux disease)   . Hiatal hernia   . Hyperlipidemia   . Hypertension   . Internal hemorrhoids   . Osteopenia   . PONV (postoperative nausea and vomiting)   . Vitamin D deficiency     Past Surgical History:  Procedure Laterality Date  . ABDOMINAL HYSTERECTOMY    . LUMBAR DISC SURGERY    . right hand surgery for cyst     . SHOULDER OPEN ROTATOR CUFF REPAIR Right 04/30/2017   Procedure: Right shoulder mini open rotator cuff repair;  Surgeon: Susa Day, MD;  Location: [facility];  Service: Orthopedics;  Laterality: Right;  Interscalene Block    There were no vitals filed for this visit.  Subjective Assessment - 06/09/17 1248    Subjective  I was folding laundry and felt right shoulder pain and it scared me.  I'm good now though.    Pain Score  2     Pain Location  Shoulder    Pain Orientation  Right    Pain Descriptors / Indicators  Aching;Dull;Discomfort    Pain Onset  More than a month  ago                      [facility] Adult PT Treatment/Exercise - 06/09/17 0001      Electrical Stimulation   Electrical Stimulation Location  -- Right shoulder.    Electrical Stimulation Action  IFC    Electrical Stimulation Parameters  Non-motoric at 80-150 Hz x 20 minutes.    Electrical Stimulation Goals  Pain      Vasopneumatic   Number Minutes Vasopneumatic   20 minutes    Vasopnuematic Location   -- Right shoulder.    Vasopneumatic Pressure  Low Pillow between thorax and right elbow.      Manual Therapy   Manual Therapy  Passive ROM    Passive ROM  In supine:  Gentle right shoulder PROM into flexion, ER and IR with low load long duration stretching technique x 30 minutes.               PT Short Term Goals - 05/26/17 1222      PT SHORT TERM GOAL #1   Title  I with HEP    Time  2  Period  Weeks    Status  New    Target Date  06/09/17      PT SHORT TERM GOAL #2   Title  Patient to demo full R shoulder AROM = to L shoulder    Time  4    Period  Weeks    Status  New    Target Date  06/23/17        PT Long Term Goals - 05/26/17 1226      PT LONG TERM GOAL #1   Title  I with Advanced HEP    Time  8    Period  Weeks    Status  New    Target Date  07/21/17      PT LONG TERM GOAL #2   Title  Patient to demo 4+/5 strength or better in the R shoulder.    Time  8    Period  Weeks    Status  New      PT LONG TERM GOAL #3   Title  Patient able to peform ADLS with 9/52 pain or less in the right shoulder    Time  8    Period  Weeks    Status  New    Target Date  07/21/17            Plan - 06/09/17 1303    Clinical Impression Statement  Patient did well with treatment.  Her shoulder pain was low today and looked good without bruising.      PT Next Visit Plan  Supine cane exercises.       Patient will benefit from skilled therapeutic intervention in order to improve the following deficits and impairments:     Visit  Diagnosis: Stiffness of right shoulder, not elsewhere classified  Acute pain of right shoulder  Muscle weakness (generalized)     Problem List Patient Active Problem List   Diagnosis Date Noted  . Rotator cuff tear, right 04/30/2017  . Aortic atherosclerosis ([place]) 04/09/2017  . Chronic back pain 11/13/2016  . GERD (gastroesophageal reflux disease) 11/02/2012  . Depression 08/30/2012  . Osteopenia 08/30/2012  . Diabetes ([place]) 08/30/2012  . Hyperlipidemia 08/30/2012  . Renal insufficiency 08/30/2012    APPLEGATE, [place] MPT 06/09/2017, 1:04 PM  [facility] [address] [place], [place], [address] Phone: [phone]   Fax:  [phone]  Name: Sonya Cook MRN: [id number] Date of Birth: 07-21-1945

## 2017-06-11 ENCOUNTER — Encounter: Payer: Self-pay | Admitting: Physical Therapy

## 2017-06-11 ENCOUNTER — Ambulatory Visit: Payer: Medicare Other | Admitting: Physical Therapy

## 2017-06-11 DIAGNOSIS — M6281 Muscle weakness (generalized): Secondary | ICD-10-CM | POA: Diagnosis not present

## 2017-06-11 DIAGNOSIS — M25611 Stiffness of right shoulder, not elsewhere classified: Secondary | ICD-10-CM

## 2017-06-11 DIAGNOSIS — M25511 Pain in right shoulder: Secondary | ICD-10-CM | POA: Diagnosis not present

## 2017-06-11 NOTE — Therapy (Signed)
[facility] [place], [place], [address] Phone: [phone]   Fax:  [phone]  Physical Therapy Treatment  Patient Details  Name: Sonya Cook MRN: [id number] Date of Birth: Apr 26, 1945 Referring Provider: Susa Day, MD   Encounter Date: 06/11/2017  PT End of Session - 06/11/17 1358    Visit Number  6    Number of Visits  16    Date for PT Re-Evaluation  07/21/17    Authorization Type  COMPLETE FOTO EVERY 5TH VISIT starting at visit 7    PT Start Time  1258    PT Stop Time  1356    PT Time Calculation (min)  58 min    Activity Tolerance  Patient tolerated treatment well    Behavior During Therapy  [facility] for tasks assessed/performed       Past Medical History:  Diagnosis Date  . Anxiety   . Chronic kidney disease   . Depression   . Diabetes mellitus without complication ([place])    type II   . Dyslipidemia   . Family history of adverse reaction to anesthesia    daughter - slow to wake up   . GERD (gastroesophageal reflux disease)   . Hiatal hernia   . Hyperlipidemia   . Hypertension   . Internal hemorrhoids   . Osteopenia   . PONV (postoperative nausea and vomiting)   . Vitamin D deficiency     Past Surgical History:  Procedure Laterality Date  . ABDOMINAL HYSTERECTOMY    . LUMBAR DISC SURGERY    . right hand surgery for cyst     . SHOULDER OPEN ROTATOR CUFF REPAIR Right 04/30/2017   Procedure: Right shoulder mini open rotator cuff repair;  Surgeon: Susa Day, MD;  Location: [facility];  Service: Orthopedics;  Laterality: Right;  Interscalene Block    There were no vitals filed for this visit.  Subjective Assessment - 06/11/17 1359    Subjective  Doing good today.    Pain Score  2     Pain Location  Shoulder    Pain Orientation  Right    Pain Descriptors / Indicators  Aching;Dull    Pain Onset  More than a month ago         [facility] PT Assessment - 06/11/17 0001      PROM   Right Shoulder  Flexion  160 Degrees    Right Shoulder External Rotation  85 Degrees                  [facility] Adult PT Treatment/Exercise - 06/11/17 0001      Modalities   Modalities  Electrical Stimulation;Vasopneumatic      Electrical Stimulation   Electrical Stimulation Location  -- Right shoulder.    Electrical Stimulation Action  IFC    Electrical Stimulation Parameters  Non-motoric at 80-150 Hz x 20 minutes.    Electrical Stimulation Goals  Pain      Vasopneumatic   Number Minutes Vasopneumatic   20 minutes    Vasopnuematic Location   -- Right shoulder.    Vasopneumatic Pressure  Low      Manual Therapy   Manual Therapy  Passive ROM    Passive ROM  In supine:  PROM into right shoulder flexion, ER and IR x 23 minutes.               PT Short Term Goals - 05/26/17 1222      PT SHORT TERM GOAL #  1   Title  I with HEP    Time  2    Period  Weeks    Status  New    Target Date  06/09/17      PT SHORT TERM GOAL #2   Title  Patient to demo full R shoulder AROM = to L shoulder    Time  4    Period  Weeks    Status  New    Target Date  06/23/17        PT Long Term Goals - 05/26/17 1226      PT LONG TERM GOAL #1   Title  I with Advanced HEP    Time  8    Period  Weeks    Status  New    Target Date  07/21/17      PT LONG TERM GOAL #2   Title  Patient to demo 4+/5 strength or better in the R shoulder.    Time  8    Period  Weeks    Status  New      PT LONG TERM GOAL #3   Title  Patient able to peform ADLS with 2/67 pain or less in the right shoulder    Time  8    Period  Weeks    Status  New    Target Date  07/21/17            Plan - 06/11/17 1402    Clinical Impression Statement  Excellent progress with regards to right shoulder PROM.    PT Treatment/Interventions  ADLs/Self Care Home Management;Electrical Stimulation;Cryotherapy;Therapeutic exercise;Manual techniques;Patient/family education;Neuromuscular re-education;Passive range of  motion;Vasopneumatic Device    PT Next Visit Plan  Supine cane exercises.  AAROM.    PT Home Exercise Plan  sling exercises, pendulums, scap retraction    Consulted and Agree with Plan of Care  Patient       Patient will benefit from skilled therapeutic intervention in order to improve the following deficits and impairments:  Pain, Impaired UE functional use, Decreased range of motion, Postural dysfunction  Visit Diagnosis: Stiffness of right shoulder, not elsewhere classified  Acute pain of right shoulder  Muscle weakness (generalized)     Problem List Patient Active Problem List   Diagnosis Date Noted  . Rotator cuff tear, right 04/30/2017  . Aortic atherosclerosis ([place]) 04/09/2017  . Chronic back pain 11/13/2016  . GERD (gastroesophageal reflux disease) 11/02/2012  . Depression 08/30/2012  . Osteopenia 08/30/2012  . Diabetes ([place]) 08/30/2012  . Hyperlipidemia 08/30/2012  . Renal insufficiency 08/30/2012    Milda Lindvall, [place] MPT 06/11/2017, 2:04 PM  [facility] [address] [place], [place], [address] Phone: [phone]   Fax:  [phone]  Name: Sonya Cook MRN: [id number] Date of Birth: September 06, 1945

## 2017-06-15 ENCOUNTER — Encounter: Payer: Medicare Other | Admitting: Physical Therapy

## 2017-06-17 ENCOUNTER — Encounter: Payer: Medicare Other | Admitting: Physical Therapy

## 2017-06-24 ENCOUNTER — Ambulatory Visit: Payer: Medicare Other | Admitting: Physical Therapy

## 2017-06-24 ENCOUNTER — Encounter: Payer: Self-pay | Admitting: Physical Therapy

## 2017-06-24 DIAGNOSIS — M6281 Muscle weakness (generalized): Secondary | ICD-10-CM

## 2017-06-24 DIAGNOSIS — M25511 Pain in right shoulder: Secondary | ICD-10-CM | POA: Diagnosis not present

## 2017-06-24 DIAGNOSIS — M25611 Stiffness of right shoulder, not elsewhere classified: Secondary | ICD-10-CM | POA: Diagnosis not present

## 2017-06-24 NOTE — Therapy (Signed)
[facility] [place], [place], [address] Phone: [phone]   Fax:  [phone]  Physical Therapy Treatment  Patient Details  Name: Sonya Cook MRN: [id number] Date of Birth: [birth year] Referring Provider: Susa Day, MD   Encounter Date: 06/24/2017  PT End of Session - 06/24/17 0933    Visit Number  7    Number of Visits  16    Date for PT Re-Evaluation  07/21/17    Authorization Type  COMPLETE FOTO EVERY 5TH VISIT starting at visit 7    PT Start Time  0900    PT Stop Time  0943    PT Time Calculation (min)  43 min    Activity Tolerance  Patient tolerated treatment well    Behavior During Therapy  [facility] for tasks assessed/performed       Past Medical History:  Diagnosis Date  . Anxiety   . Chronic kidney disease   . Depression   . Diabetes mellitus without complication ([place])    type II   . Dyslipidemia   . Family history of adverse reaction to anesthesia    daughter - slow to wake up   . GERD (gastroesophageal reflux disease)   . Hiatal hernia   . Hyperlipidemia   . Hypertension   . Internal hemorrhoids   . Osteopenia   . PONV (postoperative nausea and vomiting)   . Vitamin D deficiency     Past Surgical History:  Procedure Laterality Date  . ABDOMINAL HYSTERECTOMY    . LUMBAR DISC SURGERY    . right hand surgery for cyst     . SHOULDER OPEN ROTATOR CUFF REPAIR Right 04/30/2017   Procedure: Right shoulder mini open rotator cuff repair;  Surgeon: Susa Day, MD;  Location: [facility];  Service: Orthopedics;  Laterality: Right;  Interscalene Block    There were no vitals filed for this visit.  Subjective Assessment - 06/24/17 0903    Subjective  Patient was sick for a week yet was doing self ROM as PT instructed    Pertinent History  DM, CKD, depression, HTN, Osteopenia, lumbar disc surgery, left hip and leg pain    Diagnostic tests  MRI - full thickness tears in supra and infraspinatus tendons and  the muscles were torn as well. Subscapularis and TM are intact.    Patient Stated Goals  regain maximum movement    Currently in Pain?  Yes    Pain Score  3     Pain Location  Shoulder    Pain Orientation  Right    Pain Descriptors / Indicators  Discomfort;Sore    Pain Type  Surgical pain    Pain Onset  More than a month ago    Pain Frequency  Intermittent    Aggravating Factors   certain movements    Pain Relieving Factors  ice, rest and meds         [facility] PT Assessment - 06/24/17 0001      PROM   PROM Assessment Site  Shoulder    Right/Left Shoulder  Right    Right Shoulder Flexion  155 Degrees    Right Shoulder External Rotation  70 Degrees            No data recorded       [facility] Adult PT Treatment/Exercise - 06/24/17 0001      Exercises   Exercises  Shoulder      Shoulder Exercises: Isometric Strengthening   Flexion  5X5"  Extension  5X5"    External Rotation  5X5"    Internal Rotation  5X5"      Acupuncturist Location  R deltoids and shoulder    Electrical Stimulation Action  IFC    Electrical Stimulation Parameters  80-150hz  x55min    Electrical Stimulation Goals  Pain      Vasopneumatic   Number Minutes Vasopneumatic   15 minutes    Vasopnuematic Location   Shoulder    Vasopneumatic Pressure  Low      Manual Therapy   Manual Therapy  Passive ROM    Passive ROM  In supine:  PROM into right shoulder flexion, ER and IR  with gentle range               PT Short Term Goals - 06/24/17 0933      PT SHORT TERM GOAL #1   Title  I with HEP    Time  2    Period  Weeks    Status  On-going      PT SHORT TERM GOAL #2   Title  Patient to demo full R shoulder AROM = to L shoulder    Time  4    Period  Weeks    Status  On-going        PT Long Term Goals - 06/24/17 0934      PT LONG TERM GOAL #1   Title  I with Advanced HEP    Time  8    Period  Weeks    Status  On-going      PT LONG TERM GOAL  #2   Title  Patient to demo 4+/5 strength or better in the R shoulder.    Time  8    Period  Weeks    Status  On-going      PT LONG TERM GOAL #3   Title  Patient able to peform ADLS with 6/27 pain or less in the right shoulder    Time  8    Period  Weeks    Status  On-going            Plan - 06/24/17 0934    Clinical Impression Statement  Patient tolerated treatment well today and progressing with PROM today for right shoulder flexion and ER. Patient able to perform self HEP daily and even reported able to do light ADL's. Patient was educated on RCR protocol and safety to avoid re-injury. Patient doing well overall and progressing toward goals. FOTO 49% limitation today (initial 87%)    Clinical Presentation due to:  FOTO 7th visit 49% limitation (initial 87%)    Rehab Potential  Excellent    Clinical Impairments Affecting Rehab Potential  multiple comorbidities / surgery 04/30/17 current 8 weeks 06/25/17    PT Frequency  2x / week    PT Duration  8 weeks    PT Treatment/Interventions  ADLs/Self Care Home Management;Electrical Stimulation;Cryotherapy;Therapeutic exercise;Manual techniques;Patient/family education;Neuromuscular re-education;Passive range of motion;Vasopneumatic Device    PT Next Visit Plan  Cont with POC for ROM, Supine cane exercises.  Sinclair Ship (MD. Tonita Cong  328/19)    Consulted and Agree with Plan of Care  Patient       Patient will benefit from skilled therapeutic intervention in order to improve the following deficits and impairments:  Pain, Impaired UE functional use, Decreased range of motion, Postural dysfunction  Visit Diagnosis: Stiffness of right shoulder, not elsewhere classified  Muscle weakness (generalized)  Acute pain of right shoulder     Problem List Patient Active Problem List   Diagnosis Date Noted  . Rotator cuff tear, right 04/30/2017  . Aortic atherosclerosis ([place]) 04/09/2017  . Chronic back pain 11/13/2016  . GERD (gastroesophageal  reflux disease) 11/02/2012  . Depression 08/30/2012  . Osteopenia 08/30/2012  . Diabetes ([place]) 08/30/2012  . Hyperlipidemia 08/30/2012  . Renal insufficiency 08/30/2012   Ladean Raya, PTA 06/24/17 9:46 AM  [facility] [place], [place], [address] Phone: [phone]   Fax:  [phone]  Name: Sonya Cook MRN: [id number] Date of Birth: 02-22-1946

## 2017-06-26 ENCOUNTER — Ambulatory Visit: Payer: Medicare Other | Admitting: Physical Therapy

## 2017-06-26 DIAGNOSIS — M25611 Stiffness of right shoulder, not elsewhere classified: Secondary | ICD-10-CM

## 2017-06-26 DIAGNOSIS — M25511 Pain in right shoulder: Secondary | ICD-10-CM

## 2017-06-26 DIAGNOSIS — M6281 Muscle weakness (generalized): Secondary | ICD-10-CM

## 2017-06-26 NOTE — Therapy (Signed)
[facility] [place], [place], [address] Phone: [phone]   Fax:  [phone]  Physical Therapy Treatment  Patient Details  Name: Sonya Cook MRN: [id number] Date of Birth: 02-Jan-1946 Referring Provider: Susa Day, MD   Encounter Date: 06/26/2017  PT End of Session - 06/26/17 1254    Visit Number  8    Number of Visits  16    Date for PT Re-Evaluation  07/21/17    Authorization Type  COMPLETE FOTO EVERY 5TH VISIT starting at visit 7    PT Start Time  1123    PT Stop Time  1209    PT Time Calculation (min)  46 min    Activity Tolerance  Patient tolerated treatment well    Behavior During Therapy  [facility] for tasks assessed/performed       Past Medical History:  Diagnosis Date  . Anxiety   . Chronic kidney disease   . Depression   . Diabetes mellitus without complication ([place])    type II   . Dyslipidemia   . Family history of adverse reaction to anesthesia    daughter - slow to wake up   . GERD (gastroesophageal reflux disease)   . Hiatal hernia   . Hyperlipidemia   . Hypertension   . Internal hemorrhoids   . Osteopenia   . PONV (postoperative nausea and vomiting)   . Vitamin D deficiency     Past Surgical History:  Procedure Laterality Date  . ABDOMINAL HYSTERECTOMY    . LUMBAR DISC SURGERY    . right hand surgery for cyst     . SHOULDER OPEN ROTATOR CUFF REPAIR Right 04/30/2017   Procedure: Right shoulder mini open rotator cuff repair;  Surgeon: Susa Day, MD;  Location: [facility];  Service: Orthopedics;  Laterality: Right;  Interscalene Block    There were no vitals filed for this visit.  Subjective Assessment - 06/26/17 1252    Subjective  Doctor said I was doing great and  will be able to start some "bands."    Diagnostic tests  MRI - full thickness tears in supra and infraspinatus tendons and the muscles were torn as well. Subscapularis and TM are intact.    Patient Stated Goals  regain  maximum movement    Currently in Pain?  Yes    Pain Score  3     Pain Location  Shoulder    Pain Orientation  Right    Pain Descriptors / Indicators  Discomfort;Sore    Pain Type  Surgical pain    Pain Onset  More than a month ago                No data recorded       [facility] Adult PT Treatment/Exercise - 06/26/17 0001      Exercises   Exercises  Shoulder      Shoulder Exercises: Pulleys   Flexion Limitations  5 minutes.    Other Pulley Exercises  UE ranger on wall with left hand cupping right elbow:  Into flexion and CW and CCW x 5 minutes.    Other Pulley Exercises  Wall ladder x 5 minutes.               PT Short Term Goals - 06/24/17 4627      PT SHORT TERM GOAL #1   Title  I with HEP    Time  2    Period  Weeks    Status  On-going      PT SHORT TERM GOAL #2   Title  Patient to demo full R shoulder AROM = to L shoulder    Time  4    Period  Weeks    Status  On-going        PT Long Term Goals - 06/24/17 0934      PT LONG TERM GOAL #1   Title  I with Advanced HEP    Time  8    Period  Weeks    Status  On-going      PT LONG TERM GOAL #2   Title  Patient to demo 4+/5 strength or better in the R shoulder.    Time  8    Period  Weeks    Status  On-going      PT LONG TERM GOAL #3   Title  Patient able to peform ADLS with 4/69 pain or less in the right shoulder    Time  8    Period  Weeks    Status  On-going            Plan - 06/26/17 1255    Clinical Impression Statement  Excellent with the addition of new exercises today.    PT Next Visit Plan   AROM Right shoulder isometrics with progress to RW4 with yellow band.    Consulted and Agree with Plan of Care  Patient       Patient will benefit from skilled therapeutic intervention in order to improve the following deficits and impairments:     Visit Diagnosis: Stiffness of right shoulder, not elsewhere classified  Muscle weakness (generalized)  Acute pain of right  shoulder     Problem List Patient Active Problem List   Diagnosis Date Noted  . Rotator cuff tear, right 04/30/2017  . Aortic atherosclerosis ([place]) 04/09/2017  . Chronic back pain 11/13/2016  . GERD (gastroesophageal reflux disease) 11/02/2012  . Depression 08/30/2012  . Osteopenia 08/30/2012  . Diabetes ([place]) 08/30/2012  . Hyperlipidemia 08/30/2012  . Renal insufficiency 08/30/2012    Jeidi Gilles, [place] MPT 06/26/2017, 12:59 PM  [facility] [address] [place], [place], [address] Phone: [phone]   Fax:  [phone]  Name: KOBI ALLER MRN: [id number] Date of Birth: Mar 05, 1946

## 2017-07-01 ENCOUNTER — Ambulatory Visit: Payer: Medicare Other | Attending: Specialist | Admitting: Physical Therapy

## 2017-07-01 ENCOUNTER — Encounter: Payer: Self-pay | Admitting: Physical Therapy

## 2017-07-01 DIAGNOSIS — M25611 Stiffness of right shoulder, not elsewhere classified: Secondary | ICD-10-CM | POA: Diagnosis not present

## 2017-07-01 DIAGNOSIS — M25511 Pain in right shoulder: Secondary | ICD-10-CM | POA: Diagnosis not present

## 2017-07-01 DIAGNOSIS — M6281 Muscle weakness (generalized): Secondary | ICD-10-CM

## 2017-07-01 NOTE — Therapy (Signed)
[facility] [place], [place], [address] Phone: [phone]   Fax:  [phone]  Physical Therapy Treatment  Patient Details  Name: Sonya Cook MRN: [id number] Date of Birth: 1946/03/10 Referring Provider: Susa Day, MD   Encounter Date: 07/01/2017  PT End of Session - 07/01/17 1123    Visit Number  9    Number of Visits  16    Date for PT Re-Evaluation  07/21/17    Authorization Type  COMPLETE FOTO EVERY 5TH VISIT starting at visit 7    PT Start Time  1118    PT Stop Time  1206    PT Time Calculation (min)  48 min    Activity Tolerance  Patient tolerated treatment well    Behavior During Therapy  [facility] for tasks assessed/performed       Past Medical History:  Diagnosis Date  . Anxiety   . Chronic kidney disease   . Depression   . Diabetes mellitus without complication ([place])    type II   . Dyslipidemia   . Family history of adverse reaction to anesthesia    daughter - slow to wake up   . GERD (gastroesophageal reflux disease)   . Hiatal hernia   . Hyperlipidemia   . Hypertension   . Internal hemorrhoids   . Osteopenia   . PONV (postoperative nausea and vomiting)   . Vitamin D deficiency     Past Surgical History:  Procedure Laterality Date  . ABDOMINAL HYSTERECTOMY    . LUMBAR DISC SURGERY    . right hand surgery for cyst     . SHOULDER OPEN ROTATOR CUFF REPAIR Right 04/30/2017   Procedure: Right shoulder mini open rotator cuff repair;  Surgeon: Susa Day, MD;  Location: [facility];  Service: Orthopedics;  Laterality: Right;  Interscalene Block    There were no vitals filed for this visit.  Subjective Assessment - 07/01/17 1151    Subjective  I was hanging posters at the house.    Diagnostic tests  MRI - full thickness tears in supra and infraspinatus tendons and the muscles were torn as well. Subscapularis and TM are intact.    Patient Stated Goals  regain maximum movement    Pain Score  3     Pain Descriptors / Indicators  Discomfort;Sore    Pain Onset  More than a month ago                       [facility] Adult PT Treatment/Exercise - 07/01/17 0001      Exercises   Exercises  Shoulder      Shoulder Exercises: Supine   Other Supine Exercises  2# cane exercise(bench press) in supine to fatigue.      Shoulder Exercises: Standing   Other Standing Exercises  Right full can with minimal assistance to fatigue x 2 and ER to fatigue x 2.      Other Standing Exercises  Yellow theraband very minimal range oscillation to fatigue all 4 directions.      Shoulder Exercises: ROM/Strengthening   UBE (Upper Arm Bike)  120 RPM's AAROM x 8 minutes (28minutes forward and 4 minutes backward).      Modalities   Modalities  Health visitor Stimulation Location  Right shoulder.    Electrical Stimulation Action  IFC    Electrical Stimulation Parameters  80-150 hz x 15 minutes.  Electrical Stimulation Goals  Pain      Vasopneumatic   Number Minutes Vasopneumatic   15 minutes    Vasopnuematic Location   -- Right shoulder.    Vasopneumatic Pressure  Low      Manual Therapy   Manual Therapy  Passive ROM    Passive ROM  Sustained long hold stretching x 5 minutes into flexion and ER.               PT Short Term Goals - 06/24/17 0933      PT SHORT TERM GOAL #1   Title  I with HEP    Time  2    Period  Weeks    Status  On-going      PT SHORT TERM GOAL #2   Title  Patient to demo full R shoulder AROM = to L shoulder    Time  4    Period  Weeks    Status  On-going        PT Long Term Goals - 06/24/17 0934      PT LONG TERM GOAL #1   Title  I with Advanced HEP    Time  8    Period  Weeks    Status  On-going      PT LONG TERM GOAL #2   Title  Patient to demo 4+/5 strength or better in the R shoulder.    Time  8    Period  Weeks    Status  On-going      PT LONG TERM GOAL #3   Title  Patient  able to peform ADLS with 3/81 pain or less in the right shoulder    Time  8    Period  Weeks    Status  On-going            Plan - 07/01/17 1216    Clinical Impression Statement  Patient did well today with antigravity full can (minimal assist) and oscilliations with yellow theraband.      Clinical Impairments Affecting Rehab Potential  multiple comorbidities / surgery 04/30/17 current 8 weeks 06/25/17    PT Frequency  2x / week    PT Duration  8 weeks    Consulted and Agree with Plan of Care  Patient       Patient will benefit from skilled therapeutic intervention in order to improve the following deficits and impairments:  Pain, Impaired UE functional use, Decreased range of motion, Postural dysfunction  Visit Diagnosis: Stiffness of right shoulder, not elsewhere classified  Muscle weakness (generalized)  Acute pain of right shoulder     Problem List Patient Active Problem List   Diagnosis Date Noted  . Rotator cuff tear, right 04/30/2017  . Aortic atherosclerosis ([place]) 04/09/2017  . Chronic back pain 11/13/2016  . GERD (gastroesophageal reflux disease) 11/02/2012  . Depression 08/30/2012  . Osteopenia 08/30/2012  . Diabetes ([place]) 08/30/2012  . Hyperlipidemia 08/30/2012  . Renal insufficiency 08/30/2012    APPLEGATE, [place] MPT 07/01/2017, 12:26 PM  [facility] [place], [place], [address] Phone: [phone]   Fax:  [phone]  Name: HADAR ELGERSMA MRN: [id number] Date of Birth: December 31, 1945

## 2017-07-03 ENCOUNTER — Ambulatory Visit: Payer: Medicare Other | Admitting: Physical Therapy

## 2017-07-03 DIAGNOSIS — M6281 Muscle weakness (generalized): Secondary | ICD-10-CM | POA: Diagnosis not present

## 2017-07-03 DIAGNOSIS — M25511 Pain in right shoulder: Secondary | ICD-10-CM | POA: Diagnosis not present

## 2017-07-03 DIAGNOSIS — M25611 Stiffness of right shoulder, not elsewhere classified: Secondary | ICD-10-CM

## 2017-07-03 NOTE — Patient Instructions (Signed)
External Rotation (Side-Lying)    Lie on left side, holding _0_ pound ball in top hand, elbow bent 90, towel roll between arm and body. Keeping elbow bent at side, rotate forearm upward. HOLD 10 SECONDS. Repeat _10_ times. Rest 5__ seconds after set. Do 1-2__ sets per session. 1X/DAY.  Copyright  VHI. All rights reserved.     Robber: Scapular Retraction: Elbow Flexion (Standing)   With elbows bent to 90, pinch shoulder blades together and rotate arms out, keeping elbows bent. Repeat _10___ times per set. Do _1-3___ sets per session. Do __1__ sessions per day. May use _____ pound weights.  http://orth.exer.us/948   Copyright  VHI. All rights reserved.   Madelyn Flavors, PT 07/03/17 10:28 AM; [facility] [place], [place], [address] Phone: [phone]   Fax:  [phone]

## 2017-07-03 NOTE — Therapy (Signed)
[facility] [place], [place], [address] Phone: [phone]   Fax:  [phone]  Physical Therapy Treatment  Patient Details  Name: Sonya Cook MRN: [id number] Date of Birth: 02-Sep-1945 Referring Provider: Susa Day, MD   Encounter Date: 07/03/2017  PT End of Session - 07/03/17 1001    Visit Number  10    Number of Visits  16    Date for PT Re-Evaluation  07/21/17    Authorization Type  COMPLETE FOTO EVERY 5TH VISIT starting at visit 7    PT Start Time  1000    PT Stop Time  1045    PT Time Calculation (min)  45 min    Activity Tolerance  Patient tolerated treatment well    Behavior During Therapy  [facility] for tasks assessed/performed       Past Medical History:  Diagnosis Date  . Anxiety   . Chronic kidney disease   . Depression   . Diabetes mellitus without complication ([place])    type II   . Dyslipidemia   . Family history of adverse reaction to anesthesia    daughter - slow to wake up   . GERD (gastroesophageal reflux disease)   . Hiatal hernia   . Hyperlipidemia   . Hypertension   . Internal hemorrhoids   . Osteopenia   . PONV (postoperative nausea and vomiting)   . Vitamin D deficiency     Past Surgical History:  Procedure Laterality Date  . ABDOMINAL HYSTERECTOMY    . LUMBAR DISC SURGERY    . right hand surgery for cyst     . SHOULDER OPEN ROTATOR CUFF REPAIR Right 04/30/2017   Procedure: Right shoulder mini open rotator cuff repair;  Surgeon: Susa Day, MD;  Location: [facility];  Service: Orthopedics;  Laterality: Right;  Interscalene Block    There were no vitals filed for this visit.  Subjective Assessment - 07/03/17 1003    Subjective  Patient states she is just sore in the shoulder. Her hip and leg are really hurting her.    Pertinent History  DM, CKD, depression, HTN, Osteopenia, lumbar disc surgery, left hip and leg pain    Diagnostic tests  MRI - full thickness tears in supra and  infraspinatus tendons and the muscles were torn as well. Subscapularis and TM are intact.    Patient Stated Goals  regain maximum movement    Currently in Pain?  Yes    Pain Score  1     Pain Orientation  Right    Pain Descriptors / Indicators  Sore    Pain Type  Surgical pain    Pain Onset  More than a month ago    Pain Frequency  Intermittent    Aggravating Factors   nothing particular    Pain Relieving Factors  rest         [facility] PT Assessment - 07/03/17 0001      Observation/Other Assessments   Focus on Therapeutic Outcomes (FOTO)   40% limited                   [facility] Adult PT Treatment/Exercise - 07/03/17 0001      Shoulder Exercises: Seated   External Rotation  Strengthening;Both;10 reps;Other (comment) Robber      Shoulder Exercises: Sidelying   External Rotation  Strengthening;Right;10 reps 10 sec hold    Other Sidelying Exercises  empty can 10 x 10 sec hold      Shoulder  Exercises: Standing   Protraction  Strengthening;Right;20 reps;Theraband    Theraband Level (Shoulder Protraction)  Level 1 (Yellow)    External Rotation  Limitations    External Rotation Limitations  unable to do    Internal Rotation  Strengthening;Right;12 reps;Theraband    Theraband Level (Shoulder Internal Rotation)  Level 1 (Yellow)    Extension  Strengthening;Right;10 reps;Theraband    Theraband Level (Shoulder Extension)  Level 1 (Yellow)    Row  Strengthening;Right;10 reps;Theraband    Theraband Level (Shoulder Row)  Level 1 (Yellow)      Shoulder Exercises: ROM/Strengthening   UBE (Upper Arm Bike)  120 RPM's AAROM x 8 minutes (78minutes forward and 4 minutes backward).       Also seated Robber x 10    PT Education - 07/03/17 1040    Education provided  Yes    Education Details  HEP    Person(s) Educated  Patient    Methods  Explanation;Demonstration;Verbal cues;Handout    Comprehension  Verbalized understanding;Returned demonstration       PT Short Term Goals -  06/24/17 0933      PT SHORT TERM GOAL #1   Title  I with HEP    Time  2    Period  Weeks    Status  On-going      PT SHORT TERM GOAL #2   Title  Patient to demo full R shoulder AROM = to L shoulder    Time  4    Period  Weeks    Status  On-going        PT Long Term Goals - 07/03/17 1036      PT LONG TERM GOAL #1   Title  I with Advanced HEP    Time  8    Period  Weeks    Status  On-going      PT LONG TERM GOAL #2   Title  Patient to demo 4+/5 strength or better in the R shoulder.    Time  8    Period  Weeks    Status  On-going      PT LONG TERM GOAL #3   Title  Patient able to peform ADLS with 2/70 pain or less in the right shoulder    Baseline  1-2/10 with ADLS    Time  8    Status  On-going            Plan - 07/03/17 1034    Clinical Impression Statement  Patient did very well with TE today. 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    Rehab Potential  Excellent    Clinical Impairments Affecting Rehab Potential  multiple comorbidities / surgery 04/30/17 current 8 weeks 06/25/17    PT Frequency  2x / week    PT Duration  8 weeks    PT Treatment/Interventions  ADLs/Self Care Home Management;Electrical Stimulation;Cryotherapy;Therapeutic exercise;Manual techniques;Patient/family education;Neuromuscular re-education;Passive range of motion;Vasopneumatic Device    PT Next Visit Plan  Continue strengthening watching for compensation    PT Home Exercise Plan  sling exercises, pendulums, scap retraction, seated Robber, SDLY ER and empty can    Consulted and Agree with Plan of Care  Patient       Patient will benefit from skilled therapeutic intervention in order to improve the following deficits and impairments:  Pain, Impaired UE functional use, Decreased range of motion, Postural dysfunction  Visit Diagnosis: Stiffness of right shoulder, not elsewhere  classified  Muscle weakness (generalized)  Acute pain of right  shoulder     Problem List Patient Active Problem List   Diagnosis Date Noted  . Rotator cuff tear, right 04/30/2017  . Aortic atherosclerosis ([place]) 04/09/2017  . Chronic back pain 11/13/2016  . GERD (gastroesophageal reflux disease) 11/02/2012  . Depression 08/30/2012  . Osteopenia 08/30/2012  . Diabetes ([place]) 08/30/2012  . Hyperlipidemia 08/30/2012  . Renal insufficiency 08/30/2012    Madelyn Flavors PT 07/03/2017, 10:41 AM  [facility] [address] [place], [place], [address] Phone: [phone]   Fax:  [phone]  Name: Sonya Cook MRN: [id number] Date of Birth: [birth year]

## 2017-07-07 ENCOUNTER — Ambulatory Visit: Payer: Medicare Other | Admitting: Physical Therapy

## 2017-07-07 ENCOUNTER — Encounter: Payer: Self-pay | Admitting: Physical Therapy

## 2017-07-07 DIAGNOSIS — M6281 Muscle weakness (generalized): Secondary | ICD-10-CM | POA: Diagnosis not present

## 2017-07-07 DIAGNOSIS — M25511 Pain in right shoulder: Secondary | ICD-10-CM | POA: Diagnosis not present

## 2017-07-07 DIAGNOSIS — M25611 Stiffness of right shoulder, not elsewhere classified: Secondary | ICD-10-CM | POA: Diagnosis not present

## 2017-07-07 NOTE — Therapy (Signed)
[facility] [place], [place], [address] Phone: [phone]   Fax:  [phone]  Physical Therapy Treatment  Patient Details  Name: Sonya Cook MRN: [id number] Date of Birth: [birth year] Referring Provider: Susa Day, MD   Encounter Date: 07/07/2017  PT End of Session - 07/07/17 1206    Visit Number  11    Number of Visits  16    Date for PT Re-Evaluation  07/21/17    Authorization Type  COMPLETE FOTO EVERY 5TH VISIT starting at visit 7    PT Start Time  1121    PT Stop Time  1212    PT Time Calculation (min)  51 min    Activity Tolerance  Patient tolerated treatment well    Behavior During Therapy  [facility] for tasks assessed/performed       Past Medical History:  Diagnosis Date  . Anxiety   . Chronic kidney disease   . Depression   . Diabetes mellitus without complication ([place])    type II   . Dyslipidemia   . Family history of adverse reaction to anesthesia    daughter - slow to wake up   . GERD (gastroesophageal reflux disease)   . Hiatal hernia   . Hyperlipidemia   . Hypertension   . Internal hemorrhoids   . Osteopenia   . PONV (postoperative nausea and vomiting)   . Vitamin D deficiency     Past Surgical History:  Procedure Laterality Date  . ABDOMINAL HYSTERECTOMY    . LUMBAR DISC SURGERY    . right hand surgery for cyst     . SHOULDER OPEN ROTATOR CUFF REPAIR Right 04/30/2017   Procedure: Right shoulder mini open rotator cuff repair;  Surgeon: Susa Day, MD;  Location: [facility];  Service: Orthopedics;  Laterality: Right;  Interscalene Block    There were no vitals filed for this visit.  Subjective Assessment - 07/07/17 1207    Subjective  That last treatment helped a lot.    Diagnostic tests  MRI - full thickness tears in supra and infraspinatus tendons and the muscles were torn as well. Subscapularis and TM are intact.    Patient Stated Goals  regain maximum movement                        [facility] Adult PT Treatment/Exercise - 07/07/17 0001      Exercises   Exercises  Shoulder      Shoulder Exercises: Pulleys   Flexion Limitations  5 minutes into flexion.    Other Pulley Exercises  UE Ranger on wall x 5 minutes into flexion, diagonals, circle CW and CCW.    Other Pulley Exercises  Wall ladder x 3 minutes.      Shoulder Exercises: ROM/Strengthening   UBE (Upper Arm Bike)  120 RPM's 6 minutes.Sinclair Ship    Other ROM/Strengthening Exercises  Yellow RW4 1 minutes each way...only partial range into ER and this motion was assisted.      Acupuncturist Location  Right shoulder.    Electrical Stimulation Action  IFC    Electrical Stimulation Parameters  80-150 Hz x 20 minutes.    Electrical Stimulation Goals  Pain      Vasopneumatic   Number Minutes Vasopneumatic   20 minutes    Vasopnuematic Location   -- Low.    Vasopneumatic Pressure  Low  PT Short Term Goals - 06/24/17 0933      PT SHORT TERM GOAL #1   Title  I with HEP    Time  2    Period  Weeks    Status  On-going      PT SHORT TERM GOAL #2   Title  Patient to demo full R shoulder AROM = to L shoulder    Time  4    Period  Weeks    Status  On-going        PT Long Term Goals - 07/03/17 1036      PT LONG TERM GOAL #1   Title  I with Advanced HEP    Time  8    Period  Weeks    Status  On-going      PT LONG TERM GOAL #2   Title  Patient to demo 4+/5 strength or better in the R shoulder.    Time  8    Period  Weeks    Status  On-going      PT LONG TERM GOAL #3   Title  Patient able to peform ADLS with 2/95 pain or less in the right shoulder    Baseline  1-2/10 with ADLS    Time  8    Status  On-going            Plan - 07/07/17 1221    Clinical Impression Statement  Good job today.  Very weak into ER.  Try assisted sdly next time.    PT Treatment/Interventions  ADLs/Self Care Home Management;Electrical  Stimulation;Cryotherapy;Therapeutic exercise;Manual techniques;Patient/family education;Neuromuscular re-education;Passive range of motion;Vasopneumatic Device    PT Next Visit Plan  Continue strengthening watching for compensation    PT Home Exercise Plan  sling exercises, pendulums, scap retraction, seated Robber, SDLY ER and empty can    Consulted and Agree with Plan of Care  Patient       Patient will benefit from skilled therapeutic intervention in order to improve the following deficits and impairments:  Pain, Impaired UE functional use, Decreased range of motion, Postural dysfunction  Visit Diagnosis: Stiffness of right shoulder, not elsewhere classified  Muscle weakness (generalized)  Acute pain of right shoulder     Problem List Patient Active Problem List   Diagnosis Date Noted  . Rotator cuff tear, right 04/30/2017  . Aortic atherosclerosis ([place]) 04/09/2017  . Chronic back pain 11/13/2016  . GERD (gastroesophageal reflux disease) 11/02/2012  . Depression 08/30/2012  . Osteopenia 08/30/2012  . Diabetes ([place]) 08/30/2012  . Hyperlipidemia 08/30/2012  . Renal insufficiency 08/30/2012    Millan Legan, [place]  MPT 07/07/2017, 12:22 PM  [facility] [address] [place], [place], [address] Phone: [phone]   Fax:  [phone]  Name: Sonya Cook MRN: [id number] Date of Birth: October 27, 1945

## 2017-07-09 ENCOUNTER — Ambulatory Visit: Payer: Medicare Other | Admitting: Physical Therapy

## 2017-07-09 ENCOUNTER — Encounter: Payer: Self-pay | Admitting: Physical Therapy

## 2017-07-09 DIAGNOSIS — M25611 Stiffness of right shoulder, not elsewhere classified: Secondary | ICD-10-CM

## 2017-07-09 DIAGNOSIS — M6281 Muscle weakness (generalized): Secondary | ICD-10-CM

## 2017-07-09 DIAGNOSIS — M25511 Pain in right shoulder: Secondary | ICD-10-CM | POA: Diagnosis not present

## 2017-07-09 NOTE — Therapy (Signed)
[facility] [place], [place], [address] Phone: [phone]   Fax:  [phone]  Physical Therapy Treatment  Patient Details  Name: Sonya Cook MRN: [id number] Date of Birth: January 27, 1946 Referring Provider: Susa Day, MD   Encounter Date: 07/09/2017  PT End of Session - 07/09/17 1152    Visit Number  12    Number of Visits  16    Date for PT Re-Evaluation  07/21/17    Authorization Type  COMPLETE FOTO EVERY 5TH VISIT starting at visit 7    PT Start Time  1116    PT Stop Time  1201    PT Time Calculation (min)  45 min    Activity Tolerance  Patient tolerated treatment well    Behavior During Therapy  [facility] for tasks assessed/performed       Past Medical History:  Diagnosis Date  . Anxiety   . Chronic kidney disease   . Depression   . Diabetes mellitus without complication ([place])    type II   . Dyslipidemia   . Family history of adverse reaction to anesthesia    daughter - slow to wake up   . GERD (gastroesophageal reflux disease)   . Hiatal hernia   . Hyperlipidemia   . Hypertension   . Internal hemorrhoids   . Osteopenia   . PONV (postoperative nausea and vomiting)   . Vitamin D deficiency     Past Surgical History:  Procedure Laterality Date  . ABDOMINAL HYSTERECTOMY    . LUMBAR DISC SURGERY    . right hand surgery for cyst     . SHOULDER OPEN ROTATOR CUFF REPAIR Right 04/30/2017   Procedure: Right shoulder mini open rotator cuff repair;  Surgeon: Susa Day, MD;  [facility]: [facility];  Service: Orthopedics;  Laterality: Right;  Interscalene Block    There were no vitals filed for this visit.  Subjective Assessment - 07/09/17 1118    Subjective  Patient arrived and reported doing well    Pertinent History  DM, CKD, depression, HTN, Osteopenia, lumbar disc surgery, left hip and leg pain    Diagnostic tests  MRI - full thickness tears in supra and infraspinatus tendons and the muscles were torn as  well. Subscapularis and TM are intact.    Patient Stated Goals  regain maximum movement    Currently in Pain?  Yes    Pain Score  1     Pain Location  Shoulder    Pain Orientation  Right    Pain Descriptors / Indicators  Sore    Pain Type  Surgical pain    Pain Onset  More than a month ago    Pain Frequency  Intermittent    Aggravating Factors   unsure    Pain Relieving Factors  at rest                       [facility] Adult PT Treatment/Exercise - 07/09/17 0001      Shoulder Exercises: Sidelying   External Rotation  Strengthening;Right;20 reps;10 reps;AROM    Flexion  Strengthening;Right;20 reps;AROM      Shoulder Exercises: Standing   Protraction  Strengthening;Right;20 reps;Theraband    Theraband Level (Shoulder Protraction)  Level 1 (Yellow)    External Rotation  Strengthening;Right;Theraband;5 reps    Theraband Level (Shoulder External Rotation)  Level 1 (Yellow)    External Rotation Limitations  able to complete only 5 reps    Internal Rotation  Strengthening;Right;Theraband;20 reps    Theraband Level (Shoulder Internal Rotation)  Level 1 (Yellow)    Row  Strengthening;Right;Theraband;20 reps    Theraband Level (Shoulder Row)  Level 1 (Yellow)      Shoulder Exercises: Pulleys   Other Pulley Exercises  UE Ranger on wall  into flexion, diagonals, circle CW and CCW.x15mn    Other Pulley Exercises  Wall ladder x 1 1/2 minutes difficulty better with wall slide than climb       Shoulder Exercises: ROM/Strengthening   UBE (Upper Arm Bike)  120 RPM's 6 minutes..[place]  Right shoulder.    Electrical Stimulation Action  IFC    Electrical Stimulation Parameters  80-150hz  x134mn    Electrical Stimulation Goals  Pain      Vasopneumatic   Number Minutes Vasopneumatic   15 minutes    Vasopnuematic Location   Shoulder    Vasopneumatic Pressure  Low             PT Education - 07/09/17 1151     Education provided  Yes    Education Details  HEP    Person(s) Educated  Patient    Methods  Explanation;Demonstration;Handout    Comprehension  Verbalized understanding;Returned demonstration       PT Short Term Goals - 07/09/17 1153      PT SHORT TERM GOAL #1   Title  I with HEP    Time  2    Period  Weeks    Status  Achieved      PT SHORT TERM GOAL #2   Title  Patient to demo full R shoulder AROM = to L shoulder    Time  4    Period  Weeks    Status  On-going        PT Long Term Goals - 07/03/17 1036      PT LONG TERM GOAL #1   Title  I with Advanced HEP    Time  8    Period  Weeks    Status  On-going      PT LONG TERM GOAL #2   Title  Patient to demo 4+/5 strength or better in the R shoulder.    Time  8    Period  Weeks    Status  On-going      PT LONG TERM GOAL #3   Title  Patient able to peform ADLS with 1/5/42ain or less in the right shoulder    Baseline  1-2/10 with ADLS    Time  8    Status  On-going            Plan - 07/09/17 1153    Clinical Impression Statement  Patient tolerated treatment well today. Patient has ongoing weakness with ER and did well with sidelying ER today. HEP provided and with yellow t band to progress shoulder strengthening. Patient does better with wall slide vs wall climb due to pain in shoulder. Patient met STG #1 with others ongoing.     Rehab Potential  Excellent    Clinical Impairments Affecting Rehab Potential  multiple comorbidities / surgery 04/30/17 current 10 weeks 07/09/17    PT Frequency  2x / week    PT Duration  8 weeks    PT Treatment/Interventions  ADLs/Self Care Home Management;Electrical Stimulation;Cryotherapy;Therapeutic exercise;Manual techniques;Patient/family education;Neuromuscular re-education;Passive range of motion;Vasopneumatic Device    PT Next Visit Plan  Continue strengthening watching for compensation  Consulted and Agree with Plan of Care  Patient       Patient will benefit from skilled  therapeutic intervention in order to improve the following deficits and impairments:  Pain, Impaired UE functional use, Decreased range of motion, Postural dysfunction  Visit Diagnosis: Stiffness of right shoulder, not elsewhere classified  Muscle weakness (generalized)  Acute pain of right shoulder     Problem List Patient Active Problem List   Diagnosis Date Noted  . Rotator cuff tear, right 04/30/2017  . Aortic atherosclerosis ([place]) 04/09/2017  . Chronic back pain 11/13/2016  . GERD (gastroesophageal reflux disease) 11/02/2012  . Depression 08/30/2012  . Osteopenia 08/30/2012  . Diabetes ([place]) 08/30/2012  . Hyperlipidemia 08/30/2012  . Renal insufficiency 08/30/2012    Phillips Climes, PTA 07/09/2017, 12:01 PM  [facility] [address] [place], [place], [address] Phone: [phone]   Fax:  [phone]  Name: LEANDREA ACKLEY MRN: [id number] Date of Birth: 1946-01-04

## 2017-07-09 NOTE — Patient Instructions (Signed)
  Strengthening: Resisted Flexion   Hold tubing with right arm at side. Pull forward and up. Move shoulder through pain-free range of motion. Repeat __10__ times per set. Do _2-3___ sets per session. Do _2-3___ sessions per day.   Strengthening: Resisted Extension   Hold tubing in right hand, arm forward. Pull arm back, elbow straight. Repeat __10__ times per set. Do _2-3___ sets per session. Do _2-3___ sessions per day.   Strengthening: Resisted Internal Rotation   Hold tubing in right hand, elbow at side and forearm out. Rotate forearm in across body. Repeat __10__ times per set. Do _2-3___ sets per session. Do _2-3___ sessions per day.     SIDELYING EXTERNAL ROTATION WITH TOWEL - ER  Lie on your side with your elbow bent to 90 degrees. Place a rolled up towel between your arm and the side your body as shown.   Squeeze your shoulder blade back and down toward your buttocks and hold that position.   Next, roll your arm upwards from your stomach area towards the ceiling while maintaining your arm against the towel and with your shoulder blade held down and back the entire time. Lower your arm and repeat.  3x10 1 x day    AROM SIDELYING Flexion  While lying on your side and arm at your side, slowly raise up the arm towards overhead 3x10 1x day

## 2017-07-14 ENCOUNTER — Ambulatory Visit: Payer: Medicare Other | Admitting: *Deleted

## 2017-07-14 DIAGNOSIS — M25511 Pain in right shoulder: Secondary | ICD-10-CM

## 2017-07-14 DIAGNOSIS — M25611 Stiffness of right shoulder, not elsewhere classified: Secondary | ICD-10-CM

## 2017-07-14 DIAGNOSIS — M6281 Muscle weakness (generalized): Secondary | ICD-10-CM | POA: Diagnosis not present

## 2017-07-14 NOTE — Therapy (Addendum)
[facility] [place], [place], [address] Phone: [phone]   Fax:  [phone]  Physical Therapy Treatment  Patient Details  Name: Sonya Cook MRN: [id number] Date of Birth: Mar 27, 1946 Referring Provider: Susa Day, MD   Encounter Date: 07/14/2017  PT End of Session - 07/14/17 1253    Visit Number  13    Number of Visits  16    Date for PT Re-Evaluation  07/21/17    Authorization Type  COMPLETE FOTO EVERY 5TH VISIT starting at visit 7    PT Start Time  1120    PT Stop Time  6378 Limited Rx as per Pt    PT Time Calculation (min)  35 min       Past Medical History:  Diagnosis Date  . Anxiety   . Chronic kidney disease   . Depression   . Diabetes mellitus without complication ([place])    type II   . Dyslipidemia   . Family history of adverse reaction to anesthesia    daughter - slow to wake up   . GERD (gastroesophageal reflux disease)   . Hiatal hernia   . Hyperlipidemia   . Hypertension   . Internal hemorrhoids   . Osteopenia   . PONV (postoperative nausea and vomiting)   . Vitamin D deficiency     Past Surgical History:  Procedure Laterality Date  . ABDOMINAL HYSTERECTOMY    . LUMBAR DISC SURGERY    . right hand surgery for cyst     . SHOULDER OPEN ROTATOR CUFF REPAIR Right 04/30/2017   Procedure: Right shoulder mini open rotator cuff repair;  Surgeon: Susa Day, MD;  Location: [facility];  Service: Orthopedics;  Laterality: Right;  Interscalene Block    There were no vitals filed for this visit.  Subjective Assessment - 07/14/17 1138    Subjective  RT shldr doing well, but my LT LB and LE is killing me.    Pertinent History  DM, CKD, depression, HTN, Osteopenia, lumbar disc surgery, left hip and leg pain    Diagnostic tests  MRI - full thickness tears in supra and infraspinatus tendons and the muscles were torn as well. Subscapularis and TM are intact.    Patient Stated Goals  regain maximum  movement    Currently in Pain?  Yes    Pain Score  1     Pain Orientation  Right    Pain Descriptors / Indicators  Sore    Pain Type  Surgical pain    Pain Onset  More than a month ago                       [facility] Adult PT Treatment/Exercise - 07/14/17 0001      Exercises   Exercises  Shoulder      Shoulder Exercises: Supine   Flexion  AROM;Right;20 reps      Shoulder Exercises: Sidelying   External Rotation  AROM;Right;10 reps;20 reps    Flexion  Strengthening;Right;20 reps;AROM;10 reps    Other Sidelying Exercises  Horizontal Abd 3x10      Shoulder Exercises: Standing   Extension  Strengthening;Right;10 reps;Theraband;20 reps    Theraband Level (Shoulder Extension)  Level 1 (Yellow)    Row  Strengthening;Right;Theraband;20 reps;10 reps    Theraband Level (Shoulder Row)  Level 1 (Yellow)      Shoulder Exercises: ROM/Strengthening   UBE (Upper Arm Bike)  120 RPM's 8 minutes.[place] Ship    Other  ROM/Strengthening Exercises  --               PT Short Term Goals - 07/09/17 1153      PT SHORT TERM GOAL #1   Title  I with HEP    Time  2    Period  Weeks    Status  Achieved      PT SHORT TERM GOAL #2   Title  Patient to demo full R shoulder AROM = to L shoulder    Time  4    Period  Weeks    Status  On-going        PT Long Term Goals - 07/03/17 1036      PT LONG TERM GOAL #1   Title  I with Advanced HEP    Time  8    Period  Weeks    Status  On-going      PT LONG TERM GOAL #2   Title  Patient to demo 4+/5 strength or better in the R shoulder.    Time  8    Period  Weeks    Status  On-going      PT LONG TERM GOAL #3   Title  Patient able to peform ADLS with 3/26 pain or less in the right shoulder    Baseline  1-2/10 with ADLS    Time  8    Status  On-going            Plan - 07/14/17 1254    Clinical Impression Statement  Pt arrived today doing fairly well with RT shldr pain, but is very concerned about her LB and LT LE pain.  This is a chronic problem, but exacerbated at this time. She feels that some of the shldr exs are flaring up her back and wants to modify or not do some of the Exs. Rx focused on some Tband exs in standing and then supine and sidelying AROM exs and she did well. Pt has an order at home for DN for LB    Clinical Presentation  Stable    Clinical Decision Making  Low    Rehab Potential  Excellent    Clinical Impairments Affecting Rehab Potential  multiple comorbidities / surgery 04/30/17 current 10 weeks 07/09/17    PT Frequency  2x / week    PT Duration  8 weeks    PT Treatment/Interventions  ADLs/Self Care Home Management;Electrical Stimulation;Cryotherapy;Therapeutic exercise;Manual techniques;Patient/family education;Neuromuscular re-education;Passive range of motion;Vasopneumatic Device    PT Next Visit Plan  Continue strengthening watching for compensation    PT Home Exercise Plan  sling exercises, pendulums, scap retraction, seated Robber, SDLY ER and empty can    Consulted and Agree with Plan of Care  Patient       Patient will benefit from skilled therapeutic intervention in order to improve the following deficits and impairments:  Pain, Impaired UE functional use, Decreased range of motion, Postural dysfunction  Visit Diagnosis: Stiffness of right shoulder, not elsewhere classified  Muscle weakness (generalized)  Acute pain of right shoulder     Problem List Patient Active Problem List   Diagnosis Date Noted  . Rotator cuff tear, right 04/30/2017  . Aortic atherosclerosis ([place]) 04/09/2017  . Chronic back pain 11/13/2016  . GERD (gastroesophageal reflux disease) 11/02/2012  . Depression 08/30/2012  . Osteopenia 08/30/2012  . Diabetes ([place]) 08/30/2012  . Hyperlipidemia 08/30/2012  . Renal insufficiency 08/30/2012    RAMSEUR,CHRIS, PTA 07/14/2017, 1:02 PM  [facility] [place], [place], [address] Phone: [phone]    Fax:  [phone]  Name: Sonya Cook MRN: [id number] Date of Birth: [birth year]  PHYSICAL THERAPY DISCHARGE SUMMARY  Visits from Start of Care: 13.  Current functional level related to goals / functional outcomes: See above.   Remaining deficits: See below.   Education / Equipment: HEP. Plan: Patient agrees to discharge.  Patient goals were not met. Patient is being discharged due to not returning since the last visit.  ?????           Mali Applegate MPT

## 2017-07-16 ENCOUNTER — Encounter: Payer: Medicare Other | Admitting: Physical Therapy

## 2017-07-20 ENCOUNTER — Other Ambulatory Visit: Payer: Self-pay | Admitting: Family

## 2017-07-20 NOTE — Telephone Encounter (Signed)
OV 07/27/17

## 2017-07-27 ENCOUNTER — Ambulatory Visit: Payer: Medicare Other | Admitting: Family

## 2017-08-24 ENCOUNTER — Other Ambulatory Visit: Payer: Self-pay | Admitting: Pediatrics

## 2017-08-24 ENCOUNTER — Other Ambulatory Visit: Payer: Self-pay | Admitting: Family

## 2017-09-01 DIAGNOSIS — H40033 Anatomical narrow angle, bilateral: Secondary | ICD-10-CM | POA: Diagnosis not present

## 2017-09-01 DIAGNOSIS — E119 Type 2 diabetes mellitus without complications: Secondary | ICD-10-CM | POA: Diagnosis not present

## 2017-09-01 LAB — HM DIABETES EYE EXAM

## 2017-10-23 ENCOUNTER — Other Ambulatory Visit: Payer: Self-pay | Admitting: Family

## 2017-10-23 NOTE — Telephone Encounter (Signed)
Last seen 04/08/17

## 2017-11-23 DIAGNOSIS — Z5181 Encounter for therapeutic drug level monitoring: Secondary | ICD-10-CM | POA: Diagnosis not present

## 2017-12-22 ENCOUNTER — Other Ambulatory Visit: Payer: Self-pay | Admitting: Family

## 2017-12-23 ENCOUNTER — Other Ambulatory Visit: Payer: Self-pay | Admitting: Family

## 2017-12-23 NOTE — Telephone Encounter (Signed)
OV 01/07/18

## 2018-01-07 ENCOUNTER — Encounter: Payer: Self-pay | Admitting: Family

## 2018-01-07 ENCOUNTER — Ambulatory Visit (INDEPENDENT_AMBULATORY_CARE_PROVIDER_SITE_OTHER): Payer: Medicare Other | Admitting: Family

## 2018-01-07 VITALS — BP 150/70 | HR 54 | Temp 97.9°F | Ht 62.0 in | Wt 163.2 lb

## 2018-01-07 DIAGNOSIS — M545 Low back pain: Secondary | ICD-10-CM | POA: Diagnosis not present

## 2018-01-07 DIAGNOSIS — E785 Hyperlipidemia, unspecified: Secondary | ICD-10-CM | POA: Diagnosis not present

## 2018-01-07 DIAGNOSIS — K219 Gastro-esophageal reflux disease without esophagitis: Secondary | ICD-10-CM

## 2018-01-07 DIAGNOSIS — Z23 Encounter for immunization: Secondary | ICD-10-CM

## 2018-01-07 DIAGNOSIS — E1165 Type 2 diabetes mellitus with hyperglycemia: Secondary | ICD-10-CM | POA: Diagnosis not present

## 2018-01-07 DIAGNOSIS — F331 Major depressive disorder, recurrent, moderate: Secondary | ICD-10-CM

## 2018-01-07 DIAGNOSIS — I700 Atherosclerosis of aorta: Secondary | ICD-10-CM

## 2018-01-07 DIAGNOSIS — M8580 Other specified disorders of bone density and structure, unspecified site: Secondary | ICD-10-CM

## 2018-01-07 DIAGNOSIS — E663 Overweight: Secondary | ICD-10-CM | POA: Diagnosis not present

## 2018-01-07 DIAGNOSIS — G8929 Other chronic pain: Secondary | ICD-10-CM

## 2018-01-07 LAB — CMP14+EGFR
A/G RATIO: 2.4 — AB (ref 1.2–2.2)
ALK PHOS: 46 IU/L (ref 39–117)
ALT: 18 IU/L (ref 0–32)
AST: 24 IU/L (ref 0–40)
Albumin: 5 g/dL — ABNORMAL HIGH (ref 3.5–4.8)
BILIRUBIN TOTAL: 0.4 mg/dL (ref 0.0–1.2)
BUN/Creatinine Ratio: 18 (ref 12–28)
BUN: 24 mg/dL (ref 8–27)
CO2: 25 mmol/L (ref 20–29)
Calcium: 9.9 mg/dL (ref 8.7–10.3)
Chloride: 98 mmol/L (ref 96–106)
Creatinine, Ser: 1.36 mg/dL — ABNORMAL HIGH (ref 0.57–1.00)
GFR calc Af Amer: 45 mL/min/{1.73_m2} — ABNORMAL LOW (ref >59)
GFR, EST NON AFRICAN AMERICAN: 39 mL/min/{1.73_m2} — AB (ref >59)
GLOBULIN, TOTAL: 2.1 g/dL (ref 1.5–4.5)
Glucose: 106 mg/dL — ABNORMAL HIGH (ref 65–99)
POTASSIUM: 4.6 mmol/L (ref 3.5–5.2)
SODIUM: 139 mmol/L (ref 134–144)
Total Protein: 7.1 g/dL (ref 6.0–8.5)

## 2018-01-07 LAB — CBC WITH DIFFERENTIAL/PLATELET
BASOS: 1 %
Basophils Absolute: 0.1 10*3/uL (ref 0.0–0.2)
EOS (ABSOLUTE): 0.1 10*3/uL (ref 0.0–0.4)
Eos: 2 %
Hematocrit: 36.7 % (ref 34.0–46.6)
Hemoglobin: 12.6 g/dL (ref 11.1–15.9)
IMMATURE GRANULOCYTES: 0 %
Immature Grans (Abs): 0 10*3/uL (ref 0.0–0.1)
Lymphocytes Absolute: 2.4 10*3/uL (ref 0.7–3.1)
Lymphs: 30 %
MCH: 32 pg (ref 26.6–33.0)
MCHC: 34.3 g/dL (ref 31.5–35.7)
MCV: 93 fL (ref 79–97)
MONOS ABS: 0.7 10*3/uL (ref 0.1–0.9)
Monocytes: 9 %
NEUTROS PCT: 58 %
Neutrophils Absolute: 4.8 10*3/uL (ref 1.4–7.0)
PLATELETS: 187 10*3/uL (ref 150–450)
RBC: 3.94 x10E6/uL (ref 3.77–5.28)
RDW: 12.5 % (ref 12.3–15.4)
WBC: 8.1 10*3/uL (ref 3.4–10.8)

## 2018-01-07 LAB — LIPID PANEL
Chol/HDL Ratio: 3.3 ratio (ref 0.0–4.4)
Cholesterol, Total: 140 mg/dL (ref 100–199)
HDL: 42 mg/dL (ref >39)
LDL CALC: 68 mg/dL (ref 0–99)
Triglycerides: 152 mg/dL — ABNORMAL HIGH (ref 0–149)
VLDL CHOLESTEROL CAL: 30 mg/dL (ref 5–40)

## 2018-01-07 LAB — BAYER DCA HB A1C WAIVED: HB A1C (BAYER DCA - WAIVED): 6.6 % (ref <7.0)

## 2018-01-07 NOTE — Patient Instructions (Signed)

## 2018-01-07 NOTE — Progress Notes (Signed)
Subjective:    Patient ID: Sonya Cook, female    DOB: 06-01-1945, 72 y.o.   MRN: [id number]  No chief complaint on file.  PT presents to the office today for chronic follow up. PT is followed by Pain Management every 3 months for chronic back pain.  Diabetes  She presents for her follow-up diabetic visit. She has type 2 diabetes mellitus. Her disease course has been stable. There are no hypoglycemic associated symptoms. Associated symptoms include blurred vision. Pertinent negatives for diabetes include no foot paresthesias and no visual change. Symptoms are stable. Diabetic complications include nephropathy. Pertinent negatives for diabetic complications include no CVA or heart disease. Risk factors for coronary artery disease include diabetes mellitus, dyslipidemia, hypertension and sedentary lifestyle. She is following a generally healthy diet. Her overall blood glucose range is 110-130 mg/dl. Eye exam is current (08/2017).  Hyperlipidemia  This is a chronic problem. The current episode started more than 1 year ago. The problem is controlled. Recent lipid tests were reviewed and are normal. Exacerbating diseases include obesity. Associated symptoms include leg pain. Pertinent negatives include no shortness of breath. Current antihyperlipidemic treatment includes statins. The current treatment provides moderate improvement of lipids. Risk factors for coronary artery disease include dyslipidemia, diabetes mellitus, hypertension, obesity, post-menopausal and a sedentary lifestyle.  Back Pain  This is a chronic problem. The current episode started more than 1 year ago. The problem occurs intermittently. The problem has been waxing and waning since onset. The pain is present in the lumbar spine. The quality of the pain is described as aching. The pain is at a severity of 6/10. Associated symptoms include leg pain. Pertinent negatives include no bladder incontinence or bowel incontinence. She has  tried analgesics for the symptoms. The treatment provided moderate relief.  Gastroesophageal Reflux  She reports no belching, no coughing or no heartburn. This is a chronic problem. The current episode started more than 1 year ago. The problem occurs occasionally. The problem has been waxing and waning. Risk factors include obesity. She has tried a PPI for the symptoms. The treatment provided moderate relief.  Hypertension  This is a chronic problem. The current episode started more than 1 year ago. The problem has been waxing and waning since onset. The problem is uncontrolled. Associated symptoms include blurred vision, malaise/fatigue and peripheral edema ("at times"). Pertinent negatives include no shortness of breath. Risk factors for coronary artery disease include family history, dyslipidemia, diabetes mellitus and obesity. Hypertensive end-organ damage includes kidney disease. There is no history of CVA.      Review of Systems  Constitutional: Positive for malaise/fatigue.  Eyes: Positive for blurred vision.  Respiratory: Negative for cough and shortness of breath.   Gastrointestinal: Negative for bowel incontinence and heartburn.  Genitourinary: Negative for bladder incontinence.  Musculoskeletal: Positive for back pain.  All other systems reviewed and are negative.      Objective:   Physical Exam  Constitutional: She is oriented to person, place, and time. She appears well-developed and well-nourished. No distress.  HENT:  Head: Normocephalic and atraumatic.  Right Ear: External ear normal.  Left Ear: External ear normal.  Mouth/Throat: Oropharynx is clear and moist.  Eyes: Pupils are equal, round, and reactive to light.  Neck: Normal range of motion. Neck supple. No thyromegaly present.  Cardiovascular: Normal rate, regular rhythm, normal heart sounds and intact distal pulses.  No murmur heard. Pulmonary/Chest: Effort normal and breath sounds normal. No respiratory  distress. She has no wheezes.  Abdominal: Soft. Bowel sounds are normal. She exhibits no distension. There is no tenderness.  Musculoskeletal: Normal range of motion. She exhibits no edema or tenderness.  Lumbar pain with flexion and extension   Neurological: She is alert and oriented to person, place, and time. She has normal reflexes. No cranial nerve deficit.  Skin: Skin is warm and dry.  Psychiatric: She has a normal mood and affect. Her behavior is normal. Judgment and thought content normal.  Vitals reviewed.     BP (!) 150/70   Pulse (!) 54   Temp 97.9 F (36.6 C) (Oral)   Ht _0  (1.575 m)   Wt 163 lb 3.2 oz (74 kg)   BMI 29.85 kg/m      Assessment & Plan:  TEKESHA Cook comes in today with chief complaint of Medical Management of Chronic Issues   Diagnosis and orders addressed:  1. Aortic atherosclerosis (HCC) - CMP14+EGFR - CBC with Differential/Platelet  2. Gastroesophageal reflux disease, esophagitis presence not specified - CMP14+EGFR - CBC with Differential/Platelet  3. Type 2 diabetes mellitus with hyperglycemia, without long-term current use of insulin (HCC) - Bayer DCA Hb A1c Waived - CMP14+EGFR - CBC with Differential/Platelet - Microalbumin / creatinine urine ratio  4. Chronic low back pain, unspecified back pain laterality, unspecified whether sciatica present - CMP14+EGFR - CBC with Differential/Platelet  5. Hyperlipidemia, unspecified hyperlipidemia type - CMP14+EGFR - CBC with Differential/Platelet - Lipid panel  6. Moderate episode of recurrent major depressive disorder (HCC) - CMP14+EGFR - CBC with Differential/Platelet  7. Osteopenia, unspecified location - CMP14+EGFR - CBC with Differential/Platelet  8. Overweight (BMI 25.0-29.9) - CMP14+EGFR - CBC with Differential/Platelet   Labs pending Health Maintenance reviewed Diet and exercise encouraged  Follow up plan: 4 months    Evelina Dun, FNP

## 2018-01-08 LAB — MICROALBUMIN / CREATININE URINE RATIO
Creatinine, Urine: 103.9 mg/dL
MICROALB/CREAT RATIO: 5.4 mg/g{creat} (ref 0.0–30.0)
MICROALBUM., U, RANDOM: 5.6 ug/mL

## 2018-01-13 ENCOUNTER — Other Ambulatory Visit: Payer: Self-pay | Admitting: Family

## 2018-02-05 ENCOUNTER — Other Ambulatory Visit: Payer: Self-pay | Admitting: Family

## 2018-03-09 ENCOUNTER — Ambulatory Visit: Payer: Medicare Other | Admitting: *Deleted

## 2018-03-10 DIAGNOSIS — Z79891 Long term (current) use of opiate analgesic: Secondary | ICD-10-CM | POA: Diagnosis not present

## 2018-03-10 DIAGNOSIS — G894 Chronic pain syndrome: Secondary | ICD-10-CM | POA: Diagnosis not present

## 2018-03-10 DIAGNOSIS — Z5181 Encounter for therapeutic drug level monitoring: Secondary | ICD-10-CM | POA: Diagnosis not present

## 2018-03-10 DIAGNOSIS — E1140 Type 2 diabetes mellitus with diabetic neuropathy, unspecified: Secondary | ICD-10-CM | POA: Diagnosis not present

## 2018-03-17 ENCOUNTER — Other Ambulatory Visit: Payer: Self-pay | Admitting: Family

## 2018-03-18 DIAGNOSIS — Z79891 Long term (current) use of opiate analgesic: Secondary | ICD-10-CM | POA: Diagnosis not present

## 2018-03-18 DIAGNOSIS — Z5181 Encounter for therapeutic drug level monitoring: Secondary | ICD-10-CM | POA: Diagnosis not present

## 2018-03-18 DIAGNOSIS — G894 Chronic pain syndrome: Secondary | ICD-10-CM | POA: Diagnosis not present

## 2018-03-18 DIAGNOSIS — E1140 Type 2 diabetes mellitus with diabetic neuropathy, unspecified: Secondary | ICD-10-CM | POA: Diagnosis not present

## 2018-03-30 DIAGNOSIS — Z5181 Encounter for therapeutic drug level monitoring: Secondary | ICD-10-CM | POA: Diagnosis not present

## 2018-03-30 DIAGNOSIS — E1140 Type 2 diabetes mellitus with diabetic neuropathy, unspecified: Secondary | ICD-10-CM | POA: Diagnosis not present

## 2018-03-30 DIAGNOSIS — Z79891 Long term (current) use of opiate analgesic: Secondary | ICD-10-CM | POA: Diagnosis not present

## 2018-03-30 DIAGNOSIS — G894 Chronic pain syndrome: Secondary | ICD-10-CM | POA: Diagnosis not present

## 2018-04-12 DIAGNOSIS — E1140 Type 2 diabetes mellitus with diabetic neuropathy, unspecified: Secondary | ICD-10-CM | POA: Diagnosis not present

## 2018-04-12 DIAGNOSIS — Z5181 Encounter for therapeutic drug level monitoring: Secondary | ICD-10-CM | POA: Diagnosis not present

## 2018-04-12 DIAGNOSIS — Z79891 Long term (current) use of opiate analgesic: Secondary | ICD-10-CM | POA: Diagnosis not present

## 2018-04-12 DIAGNOSIS — G894 Chronic pain syndrome: Secondary | ICD-10-CM | POA: Diagnosis not present

## 2018-04-13 DIAGNOSIS — Z9889 Other specified postprocedural states: Secondary | ICD-10-CM | POA: Diagnosis not present

## 2018-04-13 DIAGNOSIS — M47816 Spondylosis without myelopathy or radiculopathy, lumbar region: Secondary | ICD-10-CM | POA: Diagnosis not present

## 2018-04-13 DIAGNOSIS — M5136 Other intervertebral disc degeneration, lumbar region: Secondary | ICD-10-CM | POA: Diagnosis not present

## 2018-04-13 DIAGNOSIS — M4807 Spinal stenosis, lumbosacral region: Secondary | ICD-10-CM | POA: Diagnosis not present

## 2018-04-27 ENCOUNTER — Other Ambulatory Visit: Payer: Self-pay | Admitting: Family

## 2018-05-03 ENCOUNTER — Other Ambulatory Visit: Payer: Self-pay | Admitting: Family

## 2018-05-03 NOTE — Telephone Encounter (Signed)
Last seen 01/07/18

## 2018-05-10 ENCOUNTER — Ambulatory Visit: Payer: Medicare Other | Admitting: Family

## 2018-05-12 DIAGNOSIS — G894 Chronic pain syndrome: Secondary | ICD-10-CM | POA: Diagnosis not present

## 2018-05-27 ENCOUNTER — Ambulatory Visit (INDEPENDENT_AMBULATORY_CARE_PROVIDER_SITE_OTHER): Payer: Medicare Other | Admitting: Family

## 2018-05-27 ENCOUNTER — Encounter: Payer: Self-pay | Admitting: Family

## 2018-05-27 VITALS — BP 128/66 | HR 65 | Temp 97.6°F | Ht 62.0 in | Wt 160.0 lb

## 2018-05-27 DIAGNOSIS — M545 Low back pain, unspecified: Secondary | ICD-10-CM

## 2018-05-27 DIAGNOSIS — I700 Atherosclerosis of aorta: Secondary | ICD-10-CM | POA: Diagnosis not present

## 2018-05-27 DIAGNOSIS — E785 Hyperlipidemia, unspecified: Secondary | ICD-10-CM

## 2018-05-27 DIAGNOSIS — E663 Overweight: Secondary | ICD-10-CM

## 2018-05-27 DIAGNOSIS — G8929 Other chronic pain: Secondary | ICD-10-CM

## 2018-05-27 DIAGNOSIS — K219 Gastro-esophageal reflux disease without esophagitis: Secondary | ICD-10-CM | POA: Diagnosis not present

## 2018-05-27 DIAGNOSIS — F331 Major depressive disorder, recurrent, moderate: Secondary | ICD-10-CM | POA: Diagnosis not present

## 2018-05-27 DIAGNOSIS — E1165 Type 2 diabetes mellitus with hyperglycemia: Secondary | ICD-10-CM | POA: Diagnosis not present

## 2018-05-27 LAB — BAYER DCA HB A1C WAIVED: HB A1C (BAYER DCA - WAIVED): 7.1 % — ABNORMAL HIGH (ref <7.0)

## 2018-05-27 NOTE — Progress Notes (Signed)
Subjective:    Patient ID: Sonya Cook, female    DOB: [birth year], 72 y.o.   MRN: [id number]  Chief Complaint  Patient presents with  . Medical Management of Chronic Issues    four month recheck   PT presents to the office today for chronic follow up.PT is followed by Pain Management for chronic back pain.  Diabetes  She presents for her follow-up diabetic visit. She has type 2 diabetes mellitus. Her disease course has been stable. There are no hypoglycemic associated symptoms. Pertinent negatives for diabetes include no blurred vision, no foot paresthesias and no visual change. There are no hypoglycemic complications. Symptoms are stable. Risk factors for coronary artery disease include diabetes mellitus, dyslipidemia, hypertension and sedentary lifestyle. She is following a generally healthy diet. Her overall blood glucose range is 130-140 mg/dl. Eye exam is current (June 2019).  Gastroesophageal Reflux  She complains of belching and heartburn. She reports no coughing. This is a chronic problem. The current episode started more than 1 year ago. The problem occurs rarely. The problem has been waxing and waning. The symptoms are aggravated by certain foods. She has tried a PPI for the symptoms. The treatment provided moderate relief.  Back Pain  This is a chronic problem. The current episode started more than 1 year ago. The problem occurs intermittently. The problem has been waxing and waning since onset. The pain is present in the lumbar spine. The pain is at a severity of 6/10. The pain is moderate. Associated symptoms include leg pain. She has tried analgesics for the symptoms. The treatment provided moderate relief.  Hyperlipidemia  This is a chronic problem. The current episode started more than 1 year ago. The problem is controlled. Recent lipid tests were reviewed and are normal. Associated symptoms include leg pain. Current antihyperlipidemic treatment includes statins and herbal  therapy. The current treatment provides moderate improvement of lipids. Risk factors for coronary artery disease include dyslipidemia, hypertension and a sedentary lifestyle.  Depression         This is a chronic problem.  The current episode started more than 1 year ago.   The onset quality is gradual.   The problem occurs intermittently.  Associated symptoms include irritable, restlessness, decreased interest and sad.  Associated symptoms include no helplessness and no hopelessness.  Compliance with treatment is good.     Review of Systems  Eyes: Negative for blurred vision.  Respiratory: Negative for cough.   Gastrointestinal: Positive for heartburn.  Musculoskeletal: Positive for arthralgias and back pain.  Psychiatric/Behavioral: Positive for depression.  All other systems reviewed and are negative.      Objective:   Physical Exam Vitals signs reviewed.  Constitutional:      General: She is irritable. She is not in acute distress.    Appearance: She is well-developed.  HENT:     Head: Normocephalic and atraumatic.     Right Ear: Tympanic membrane normal.     Left Ear: Tympanic membrane normal.  Eyes:     Pupils: Pupils are equal, round, and reactive to light.  Neck:     Musculoskeletal: Normal range of motion and neck supple.     Thyroid: No thyromegaly.  Cardiovascular:     Rate and Rhythm: Normal rate and regular rhythm.     Heart sounds: Normal heart sounds. No murmur.  Pulmonary:     Effort: Pulmonary effort is normal. No respiratory distress.     Breath sounds: Normal breath sounds. No wheezing.  Abdominal:     General: Bowel sounds are normal. There is no distension.     Palpations: Abdomen is soft.     Tenderness: There is no abdominal tenderness.  Musculoskeletal:        General: No tenderness.     Comments: Pain in lower lumbar with flexion and extension  Skin:    General: Skin is warm and dry.  Neurological:     Mental Status: She is alert and oriented  to person, place, and time.     Cranial Nerves: No cranial nerve deficit.     Deep Tendon Reflexes: Reflexes are normal and symmetric.  Psychiatric:        Behavior: Behavior normal.        Thought Content: Thought content normal.        Judgment: Judgment normal.       BP 128/66   Pulse 65   Temp 97.6 F (36.4 C) (Oral)   Ht 5' 2"  (1.575 m)   Wt 160 lb (72.6 kg)   BMI 29.26 kg/m      Assessment & Plan:  Sonya Cook comes in today with chief complaint of Medical Management of Chronic Issues (four month recheck)   Diagnosis and orders addressed:  1. Type 2 diabetes mellitus with hyperglycemia, without long-term current use of insulin (HCC) - CMP14+EGFR - CBC with Differential/Platelet - Bayer DCA Hb A1c Waived  2. Gastroesophageal reflux disease, esophagitis presence not specified - CMP14+EGFR - CBC with Differential/Platelet  3. Chronic low back pain, unspecified back pain laterality, unspecified whether sciatica present - CMP14+EGFR - CBC with Differential/Platelet - DHEA  4. Moderate episode of recurrent major depressive disorder (HCC) - CMP14+EGFR - CBC with Differential/Platelet - DHEA  5. Hyperlipidemia, unspecified hyperlipidemia type - CMP14+EGFR - CBC with Differential/Platelet  6. Overweight (BMI 25.0-29.9) - CMP14+EGFR - CBC with Differential/Platelet  7. Aortic atherosclerosis ([place]) - CMP14+EGFR - CBC with Differential/Platelet   Labs pending Health Maintenance reviewed Diet and exercise encouraged  Follow up plan: 6 months    Evelina Dun, FNP

## 2018-05-27 NOTE — Patient Instructions (Signed)
Health Maintenance After Age 72 After age 72, you are at a higher risk for certain long-term diseases and infections as well as injuries from falls. Falls are a major cause of broken bones and head injuries in people who are older than age 72. Getting regular preventive care can help to keep you healthy and well. Preventive care includes getting regular testing and making lifestyle changes as recommended by your health care provider. Talk with your health care provider about:  Which screenings and tests you should have. A screening is a test that checks for a disease when you have no symptoms.  A diet and exercise plan that is right for you. What should I know about screenings and tests to prevent falls? Screening and testing are the best ways to find a health problem early. Early diagnosis and treatment give you the best chance of managing medical conditions that are common after age 72. Certain conditions and lifestyle choices may make you more likely to have a fall. Your health care provider may recommend:  Regular vision checks. Poor vision and conditions such as cataracts can make you more likely to have a fall. If you wear glasses, make sure to get your prescription updated if your vision changes.  Medicine review. Work with your health care provider to regularly review all of the medicines you are taking, including over-the-counter medicines. Ask your health care provider about any side effects that may make you more likely to have a fall. Tell your health care provider if any medicines that you take make you feel dizzy or sleepy.  Osteoporosis screening. Osteoporosis is a condition that causes the bones to get weaker. This can make the bones weak and cause them to break more easily.  Blood pressure screening. Blood pressure changes and medicines to control blood pressure can make you feel dizzy.  Strength and balance checks. Your health care provider may recommend certain tests to check your  strength and balance while standing, walking, or changing positions.  Foot health exam. Foot pain and numbness, as well as not wearing proper footwear, can make you more likely to have a fall.  Depression screening. You may be more likely to have a fall if you have a fear of falling, feel emotionally low, or feel unable to do activities that you used to do.  Alcohol use screening. Using too much alcohol can affect your balance and may make you more likely to have a fall. What actions can I take to lower my risk of falls? General instructions  Talk with your health care provider about your risks for falling. Tell your health care provider if: ? You fall. Be sure to tell your health care provider about all falls, even ones that seem minor. ? You feel dizzy, sleepy, or off-balance.  Take over-the-counter and prescription medicines only as told by your health care provider. These include any supplements.  Eat a healthy diet and maintain a healthy weight. A healthy diet includes low-fat dairy products, low-fat (lean) meats, and fiber from whole grains, beans, and lots of fruits and vegetables. Home safety  Remove any tripping hazards, such as rugs, cords, and clutter.  Install safety equipment such as grab bars in bathrooms and safety rails on stairs.  Keep rooms and walkways well-lit. Activity   Follow a regular exercise program to stay fit. This will help you maintain your balance. Ask your health care provider what types of exercise are appropriate for you.  If you need a cane or   walker, use it as recommended by your health care provider.  Wear supportive shoes that have nonskid soles. Lifestyle  Do not drink alcohol if your health care provider tells you not to drink.  If you drink alcohol, limit how much you have: ? 0-1 drink a day for women. ? 0-2 drinks a day for men.  Be aware of how much alcohol is in your drink. In the [place] one drink equals one typical bottle of beer (12  oz), one-half glass of wine (5 oz), or one shot of hard liquor (1 oz).  Do not use any products that contain nicotine or tobacco, such as cigarettes and e-cigarettes. If you need help quitting, ask your health care provider. Summary  Having a healthy lifestyle and getting preventive care can help to protect your health and wellness after age 72.  Screening and testing are the best way to find a health problem early and help you avoid having a fall. Early diagnosis and treatment give you the best chance for managing medical conditions that are more common for people who are older than age 72.  Falls are a major cause of broken bones and head injuries in people who are older than age 72. Take precautions to prevent a fall at home.  Work with your health care provider to learn what changes you can make to improve your health and wellness and to prevent falls. This information is not intended to replace advice given to you by your health care provider. Make sure you discuss any questions you have with your health care provider. Document Released: 01/28/2017 Document Revised: 01/28/2017 Document Reviewed: 01/28/2017 Elsevier Interactive Patient Education  2019 [employer]  

## 2018-05-30 LAB — CBC WITH DIFFERENTIAL/PLATELET
Basophils Absolute: 0.1 10*3/uL (ref 0.0–0.2)
Basos: 1 %
EOS (ABSOLUTE): 0.2 10*3/uL (ref 0.0–0.4)
EOS: 3 %
HEMOGLOBIN: 12.8 g/dL (ref 11.1–15.9)
Hematocrit: 37.1 % (ref 34.0–46.6)
IMMATURE GRANS (ABS): 0 10*3/uL (ref 0.0–0.1)
IMMATURE GRANULOCYTES: 0 %
Lymphocytes Absolute: 2.7 10*3/uL (ref 0.7–3.1)
Lymphs: 39 %
MCH: 32.2 pg (ref 26.6–33.0)
MCHC: 34.5 g/dL (ref 31.5–35.7)
MCV: 94 fL (ref 79–97)
Monocytes Absolute: 0.7 10*3/uL (ref 0.1–0.9)
Monocytes: 9 %
Neutrophils Absolute: 3.4 10*3/uL (ref 1.4–7.0)
Neutrophils: 48 %
Platelets: 181 10*3/uL (ref 150–450)
RBC: 3.97 x10E6/uL (ref 3.77–5.28)
RDW: 12.3 % (ref 11.7–15.4)
WBC: 7 10*3/uL (ref 3.4–10.8)

## 2018-05-30 LAB — CMP14+EGFR
ALK PHOS: 48 IU/L (ref 39–117)
ALT: 19 IU/L (ref 0–32)
AST: 23 IU/L (ref 0–40)
Albumin/Globulin Ratio: 2.4 — ABNORMAL HIGH (ref 1.2–2.2)
Albumin: 4.7 g/dL (ref 3.7–4.7)
BUN/Creatinine Ratio: 16 (ref 12–28)
BUN: 21 mg/dL (ref 8–27)
Bilirubin Total: 0.3 mg/dL (ref 0.0–1.2)
CO2: 24 mmol/L (ref 20–29)
Calcium: 9.8 mg/dL (ref 8.7–10.3)
Chloride: 100 mmol/L (ref 96–106)
Creatinine, Ser: 1.34 mg/dL — ABNORMAL HIGH (ref 0.57–1.00)
GFR calc Af Amer: 46 mL/min/{1.73_m2} — ABNORMAL LOW (ref >59)
GFR calc non Af Amer: 40 mL/min/{1.73_m2} — ABNORMAL LOW (ref >59)
GLUCOSE: 138 mg/dL — AB (ref 65–99)
Globulin, Total: 2 g/dL (ref 1.5–4.5)
Potassium: 4.4 mmol/L (ref 3.5–5.2)
Sodium: 140 mmol/L (ref 134–144)
Total Protein: 6.7 g/dL (ref 6.0–8.5)

## 2018-05-30 LAB — DHEA: DEHYDROEPIANDROSTERO: 250 ng/dL (ref 31–701)

## 2018-05-31 ENCOUNTER — Telehealth: Payer: Self-pay | Admitting: Licensed Clinical Social Worker

## 2018-05-31 NOTE — Telephone Encounter (Signed)
Contacted Sonya Cook via phone [phone]. This was a outreach and follow up call. Left voicemail.

## 2018-06-05 ENCOUNTER — Other Ambulatory Visit: Payer: Self-pay | Admitting: Family

## 2018-06-09 DIAGNOSIS — Z79891 Long term (current) use of opiate analgesic: Secondary | ICD-10-CM | POA: Diagnosis not present

## 2018-06-09 DIAGNOSIS — E1140 Type 2 diabetes mellitus with diabetic neuropathy, unspecified: Secondary | ICD-10-CM | POA: Diagnosis not present

## 2018-06-09 DIAGNOSIS — Z5181 Encounter for therapeutic drug level monitoring: Secondary | ICD-10-CM | POA: Diagnosis not present

## 2018-06-09 DIAGNOSIS — G894 Chronic pain syndrome: Secondary | ICD-10-CM | POA: Diagnosis not present

## 2018-06-18 ENCOUNTER — Telehealth: Payer: Self-pay | Admitting: Family

## 2018-06-18 ENCOUNTER — Telehealth: Payer: Self-pay

## 2018-06-18 MED ORDER — ONDANSETRON HCL 4 MG PO TABS: 4.0000 mg | ORAL_TABLET | Freq: Three times a day (TID) | ORAL | 2 refills | Status: DC | PRN

## 2018-06-18 NOTE — Telephone Encounter (Signed)
Zofran Prescription sent to pharmacy. Make sure you force fluids.

## 2018-06-18 NOTE — Telephone Encounter (Signed)
Patient aware.

## 2018-06-18 NOTE — Telephone Encounter (Signed)
Can pt have something sent in

## 2018-06-18 NOTE — Telephone Encounter (Signed)
Patient declined [place] services.  Patient reports that her depression is due to her chronic pain.  Writer routed information to the PCP and Dr. Modesta Messing.

## 2018-07-02 ENCOUNTER — Other Ambulatory Visit: Payer: Self-pay | Admitting: *Deleted

## 2018-07-02 MED ORDER — PRASTERONE (DHEA) 50 MG PO CAPS: 50.0000 mg | ORAL_CAPSULE | Freq: Every day | ORAL | 1 refills | Status: DC

## 2018-07-07 DIAGNOSIS — Z5181 Encounter for therapeutic drug level monitoring: Secondary | ICD-10-CM | POA: Diagnosis not present

## 2018-07-07 DIAGNOSIS — Z79891 Long term (current) use of opiate analgesic: Secondary | ICD-10-CM | POA: Diagnosis not present

## 2018-07-07 DIAGNOSIS — G894 Chronic pain syndrome: Secondary | ICD-10-CM | POA: Diagnosis not present

## 2018-07-07 DIAGNOSIS — E1140 Type 2 diabetes mellitus with diabetic neuropathy, unspecified: Secondary | ICD-10-CM | POA: Diagnosis not present

## 2018-07-12 ENCOUNTER — Other Ambulatory Visit: Payer: Self-pay | Admitting: Family

## 2018-08-03 ENCOUNTER — Ambulatory Visit: Payer: Medicare Other | Admitting: *Deleted

## 2018-08-04 DIAGNOSIS — M25559 Pain in unspecified hip: Secondary | ICD-10-CM | POA: Diagnosis not present

## 2018-08-04 DIAGNOSIS — Z5181 Encounter for therapeutic drug level monitoring: Secondary | ICD-10-CM | POA: Diagnosis not present

## 2018-08-04 DIAGNOSIS — M792 Neuralgia and neuritis, unspecified: Secondary | ICD-10-CM | POA: Diagnosis not present

## 2018-08-04 DIAGNOSIS — E1140 Type 2 diabetes mellitus with diabetic neuropathy, unspecified: Secondary | ICD-10-CM | POA: Diagnosis not present

## 2018-08-04 DIAGNOSIS — I119 Hypertensive heart disease without heart failure: Secondary | ICD-10-CM | POA: Diagnosis not present

## 2018-08-04 DIAGNOSIS — G4700 Insomnia, unspecified: Secondary | ICD-10-CM | POA: Diagnosis not present

## 2018-08-04 DIAGNOSIS — G8929 Other chronic pain: Secondary | ICD-10-CM | POA: Diagnosis not present

## 2018-08-04 DIAGNOSIS — M25519 Pain in unspecified shoulder: Secondary | ICD-10-CM | POA: Diagnosis not present

## 2018-08-04 DIAGNOSIS — M48062 Spinal stenosis, lumbar region with neurogenic claudication: Secondary | ICD-10-CM | POA: Diagnosis not present

## 2018-08-04 DIAGNOSIS — G894 Chronic pain syndrome: Secondary | ICD-10-CM | POA: Diagnosis not present

## 2018-08-04 DIAGNOSIS — Z79891 Long term (current) use of opiate analgesic: Secondary | ICD-10-CM | POA: Diagnosis not present

## 2018-08-04 DIAGNOSIS — M47897 Other spondylosis, lumbosacral region: Secondary | ICD-10-CM | POA: Diagnosis not present

## 2018-08-11 ENCOUNTER — Other Ambulatory Visit: Payer: Self-pay | Admitting: Family

## 2018-08-24 DIAGNOSIS — M519 Unspecified thoracic, thoracolumbar and lumbosacral intervertebral disc disorder: Secondary | ICD-10-CM | POA: Diagnosis not present

## 2018-08-24 DIAGNOSIS — M545 Low back pain: Secondary | ICD-10-CM | POA: Diagnosis not present

## 2018-09-01 ENCOUNTER — Ambulatory Visit: Payer: Medicare Other | Admitting: *Deleted

## 2018-09-01 DIAGNOSIS — Z5181 Encounter for therapeutic drug level monitoring: Secondary | ICD-10-CM | POA: Diagnosis not present

## 2018-09-01 DIAGNOSIS — G894 Chronic pain syndrome: Secondary | ICD-10-CM | POA: Diagnosis not present

## 2018-09-01 DIAGNOSIS — E1140 Type 2 diabetes mellitus with diabetic neuropathy, unspecified: Secondary | ICD-10-CM | POA: Diagnosis not present

## 2018-09-01 DIAGNOSIS — Z79891 Long term (current) use of opiate analgesic: Secondary | ICD-10-CM | POA: Diagnosis not present

## 2018-09-06 DIAGNOSIS — H25812 Combined forms of age-related cataract, left eye: Secondary | ICD-10-CM | POA: Diagnosis not present

## 2018-09-06 DIAGNOSIS — H25811 Combined forms of age-related cataract, right eye: Secondary | ICD-10-CM | POA: Diagnosis not present

## 2018-09-06 LAB — HM DIABETES EYE EXAM

## 2018-09-07 ENCOUNTER — Encounter: Payer: Self-pay | Admitting: Family

## 2018-09-10 ENCOUNTER — Ambulatory Visit (INDEPENDENT_AMBULATORY_CARE_PROVIDER_SITE_OTHER): Payer: Medicare Other | Admitting: *Deleted

## 2018-09-10 VITALS — Ht 62.0 in | Wt 160.0 lb

## 2018-09-10 DIAGNOSIS — Z0000 Encounter for general adult medical examination without abnormal findings: Secondary | ICD-10-CM | POA: Diagnosis not present

## 2018-09-10 NOTE — Progress Notes (Signed)
MEDICARE ANNUAL WELLNESS VISIT  09/10/2018  Telephone Visit Disclaimer This Medicare AWV was conducted by telephone due to national recommendations for restrictions regarding the COVID-19 Pandemic (e.g. social distancing).  I verified, using two identifiers, that I am speaking with [name] or their authorized healthcare agent. I discussed the limitations, risks, security, and privacy concerns of performing an evaluation and management service by telephone and the potential availability of an in-person appointment in the future. The patient expressed understanding and agreed to proceed.   Subjective:  ALLEAH DEARMAN is a 73 y.o. female patient of Hawks, Theador Hawthorne, FNP who had a Medicare Annual Wellness Visit today via telephone. Tanysha is Retired and lives with their spouse and 68 year old great grandchild. she has 2 children. she reports that she is socially active and does interact with friends/family regularly. she is not physically active and does not have any hobbies at this time.    Patient Care Team: Sharion Balloon, FNP as PCP - General (Family Medicine)  Advanced Directives 09/10/2018 05/26/2017 04/27/2017 11/17/2014  Does Patient Have a Medical Advance Directive? No No Yes No  Type of Advance Directive - Public librarian;Living will -  Does patient want to make changes to medical advance directive? - - No - Patient declined -  Copy of [place] in Chart? - - No - copy requested -  Would patient like information on creating a medical advance directive? Yes (MAU/Ambulatory/Procedural Areas - Information given) No - Patient declined - [facility] Utilization Over the Past 12 Months: # of hospitalizations or ER visits: 0 # of surgeries: 0  Review of Systems    Patient reports that her overall health is worse compared to last year.  Patient Reported Readings (BP, Pulse, CBG, Weight, etc) none  Review of Systems: History obtained from  chart review and the patient General ROS: positive for  - Chronic Back Pain  All other systems negative.  Pain Assessment Pain : 0-10 Pain Score: 4  Pain Type: Chronic pain Pain Location: Back Pain Orientation: Lower Pain Radiating Towards: Left Leg and foot Pain Descriptors / Indicators: Constant, Aching, Radiating Pain Onset: More than a month ago Pain Frequency: Constant Pain Relieving Factors: Tylenol, Fentanyl patch  Pain Relieving Factors: Tylenol, Fentanyl patch  Current Medications & Allergies (verified) Allergies as of 09/10/2018      Reactions   Codeine    REACTION: hives   Penicillins Hives   Has patient had a PCN reaction causing immediate rash, facial/tongue/throat swelling, SOB or lightheadedness with hypotension: Yes Has patient had a PCN reaction causing severe rash involving mucus membranes or skin necrosis: No Has patient had a PCN reaction that required hospitalization: No Has patient had a PCN reaction occurring within the last 10 years: No If all of the above answers are "NO", then may proceed with Cephalosporin use.      Medication List       Accurate as of September 10, 2018 10:11 AM. If you have any questions, ask your nurse or doctor.        atorvastatin 40 MG tablet Commonly known as: LIPITOR TAKE ONE (1) TABLET EACH DAY   Cholecalciferol 50 MCG (2000 UT) Caps Take 4,000 Units by mouth 2 (two) times daily.   COQ10 PO Take 1 tablet by mouth daily.   escitalopram 20 MG tablet Commonly known as: LEXAPRO TAKE ONE (1) TABLET EACH DAY   fentaNYL 25  MCG/HR Commonly known as: DURAGESIC   Fish Oil Maximum Strength 1200 MG Caps Take 3,600 mg by mouth 2 (two) times daily.   HYDROcodone-acetaminophen 10-325 MG tablet Commonly known as: Norco Take 1 tablet by mouth every 6 (six) hours as needed.   HYDROcodone-acetaminophen 10-325 MG tablet Commonly known as: Norco Take 1 tablet by mouth every 4 (four) hours as needed.   lisinopril 10 MG tablet  Commonly known as: ZESTRIL TAKE ONE (1) TABLET EACH DAY   Magnesium 250 MG Tabs Take 500 mg by mouth 2 (two) times daily.   meloxicam 15 MG tablet Commonly known as: MOBIC Take 15 mg by mouth daily.   Narcan 4 MG/0.1ML Liqd nasal spray kit Generic drug: naloxone   omeprazole-sodium bicarbonate 40-1100 MG capsule Commonly known as: ZEGERID Take 1 capsule by mouth 2 (two) times daily.   ondansetron 4 MG tablet Commonly known as: Zofran Take 1 tablet (4 mg total) by mouth every 8 (eight) hours as needed for nausea or vomiting.   Prasterone (DHEA) 50 MG Caps Commonly known as: DHEA 50 Take 1 capsule (50 mg total) by mouth daily.   Unifine Pentips 31G X 6 MM Misc Generic drug: Insulin Pen Needle   Victoza 18 MG/3ML Sopn Generic drug: liraglutide INJECT 0.2ML SQ DAILY   Yeast-Gard Adv Homeopathic Caps Take 1 capsule by mouth daily.       History (reviewed): Past Medical History:  Diagnosis Date  . Anxiety   . Chronic kidney disease   . Depression   . Diabetes mellitus without complication ([place])    type II   . Dyslipidemia   . Family history of adverse reaction to anesthesia    daughter - slow to wake up   . GERD (gastroesophageal reflux disease)   . Hiatal hernia   . Hyperlipidemia   . Hypertension   . Internal hemorrhoids   . Osteopenia   . PONV (postoperative nausea and vomiting)   . Vitamin D deficiency    Past Surgical History:  Procedure Laterality Date  . ABDOMINAL HYSTERECTOMY    . LUMBAR DISC SURGERY    . right hand surgery for cyst     . SHOULDER OPEN ROTATOR CUFF REPAIR Right 04/30/2017   Procedure: Right shoulder mini open rotator cuff repair;  Surgeon: Susa Day, MD;  Location: [facility];  Service: Orthopedics;  Laterality: Right;  Interscalene Block   Family History  Problem Relation Age of Onset  . Hodgkin's lymphoma Mother   . Cancer Father        Lungs  . Colon cancer Neg Hx   . Diabetes Neg Hx   . Stomach cancer Neg Hx   . Rectal  cancer Neg Hx    Social History   Socioeconomic History  . Marital status: Married    Spouse name: [name]  . Number of children: 2  . Years of education: Not on file  . Highest education level: Some college, no degree  Occupational History  . Occupation: Retired  Scientific laboratory technician  . Financial resource strain: Not on file  . Food insecurity    Worry: Never true    Inability: Never true  . Transportation needs    Medical: No    Non-medical: No  Tobacco Use  . Smoking status: Former Smoker    Types: Cigarettes    Start date: 04/22/2000  . Smokeless tobacco: Never Used  Substance and Sexual Activity  . Alcohol use: Yes    Comment: occsional use  . Drug  use: No  . Sexual activity: Not Currently  Lifestyle  . Physical activity    Days per week: 0 days    Minutes per session: 0 min  . Stress: Not at all  Relationships  . Social connections    Talks on phone: More than three times a week    Gets together: Once a week    Attends religious service: 1 to 4 times per year    Active member of club or organization: No    Attends meetings of clubs or organizations: Never    Relationship status: Married  Other Topics Concern  . Not on file  Social History Narrative  . Not on file    Activities of Daily Living In your present state of health, do you have any difficulty performing the following activities: 09/10/2018  Hearing? N  Vision? N  Difficulty concentrating or making decisions? N  Walking or climbing stairs? Y  Comment Back Pain  Dressing or bathing? N  Doing errands, shopping? N  Preparing Food and eating ? N  Using the Toilet? N  In the past six months, have you accidently leaked urine? N  Do you have problems with loss of bowel control? N  Managing your Medications? N  Managing your Finances? N  Housekeeping or managing your Housekeeping? N  Some recent data might be hidden    Patient Literacy How often do you need to have someone help you when you read  instructions, pamphlets, or other written materials from your doctor or pharmacy?: 1 - Never What is the last grade level you completed in school?: Some College  Exercise Current Exercise Habits: The patient does not participate in regular exercise at present, Exercise limited by: None identified  Diet Patient reports consuming 2 meals a day and 1 snack(s) a day Patient reports that her primary diet is: Regular Patient reports that she does have regular access to food.   Depression Screen PHQ 2/9 Scores 09/10/2018 05/27/2018 01/07/2018 04/08/2017 11/13/2016 06/17/2016 02/19/2015  PHQ - 2 Score 0 _0 0 0 0  PHQ- 9 Score - _1 - - -     Fall Risk Fall Risk  09/10/2018 05/27/2018 01/07/2018 04/08/2017 11/13/2016  Falls in the past year? 1 0 No No No  Number falls in past yr: 0 - - - -  Risk for fall due to : History of fall(s) - - - -     Objective:  Sonia Side Beecher seemed alert and oriented and she participated appropriately during our telephone visit.  Blood Pressure Weight BMI  BP Readings from Last 3 Encounters:  05/27/18 128/66  01/07/18 (!) 150/70  04/30/17 138/69   Wt Readings from Last 3 Encounters:  09/10/18 160 lb (72.6 kg)  05/27/18 160 lb (72.6 kg)  01/07/18 163 lb 3.2 oz (74 kg)   BMI Readings from Last 1 Encounters:  09/10/18 29.26 kg/m    *Unable to obtain current vital signs, weight, and BMI due to telephone visit type  Hearing/Vision  . Staphanie did not seem to have difficulty with hearing/understanding during the telephone conversation . Reports that she has had a formal eye exam by an eye care professional within the past year . Reports that she has not had a formal hearing evaluation within the past year *Unable to fully assess hearing and vision during telephone visit type  Cognitive Function: 6CIT Screen 09/10/2018  What Year? 0 points  What month? 0 points  What time? 0 points  Count back from 20 0 points  Months in reverse 0 points  Repeat phrase 0  points  Total Score 0   (Normal:0-7, Significant for Dysfunction: >8)  Normal Cognitive Function Screening: Yes   Immunization & Health Maintenance Record Immunization History  Administered Date(s) Administered  . Influenza Split 03/05/2017  . Influenza, High Dose Seasonal PF 12/18/2015, 01/07/2018  . Influenza, Seasonal, Injecte, Preservative Fre 12/22/2013, 02/10/2015  . Influenza,inj,quad, With Preservative 02/28/2017  . Influenza-Unspecified 11/29/2013, 02/10/2015  . Pneumococcal Conjugate-13 06/17/2016  . Pneumococcal Polysaccharide-23 02/28/2013  . Pneumococcal-Unspecified 08/30/2015  . Zoster 08/18/2013    Health Maintenance  Topic Date Due  . Samul Dada  09/17/1964  . COLON CANCER SCREENING ANNUAL FOBT  09/17/1996  . OPHTHALMOLOGY EXAM  09/02/2018  . MAMMOGRAM  12/18/2026 (Originally 02/29/2012)  . INFLUENZA VACCINE  10/30/2018  . HEMOGLOBIN A1C  11/25/2018  . FOOT EXAM  01/08/2019  . COLONOSCOPY  01/25/2020  . DEXA SCAN  Completed  . Hepatitis C Screening  Completed  . PNA vac Low Risk Adult  Completed       Assessment  This is a routine wellness examination for [employer]  Health Maintenance: Due or Overdue Health Maintenance Due  Topic Date Due  . TETANUS/TDAP  09[birth year]17/1964  . COLON CANCER SCREENING ANNUAL FOBT  09/17/1996  . OPHTHALMOLOGY EXAM  09/02/2018    Sonia Side Dripps does not need a referral for Community Assistance: Care Management:   no Social Work:    no Prescription Assistance:  no Nutrition/Diabetes Education:  no   Plan:  Personalized Goals Goals Addressed            This Visit's Progress   . Exercise 3x per week (30 min per time)       Try to exercise for 30 minutes, 3 times weekly      Personalized Health Maintenance & Screening Recommendations  Td vaccine Screening mammography Colorectal cancer screening Advanced directives: has NO advanced directive  - add't info requested. Referral to SW: no  Lung Cancer  Screening Recommended: no (Low Dose CT Chest recommended if Age 44-80 years, 30 pack-year currently smoking OR have quit w/in past 15 years) Hepatitis C Screening recommended: no HIV Screening recommended: no  Advanced Directives: Written information was prepared per patient's request.  Referrals & Orders No orders of the defined types were placed in this encounter.   Follow-up Plan . Follow-up with Sharion Balloon, FNP as planned    I have personally reviewed and noted the following in the patient's chart:   . Medical and social history . Use of alcohol, tobacco or illicit drugs  . Current medications and supplements . Functional ability and status . Nutritional status . Physical activity . Advanced directives . List of other physicians . Hospitalizations, surgeries, and ER visits in previous 12 months . Vitals . Screenings to include cognitive, depression, and falls . Referrals and appointments  In addition, I have reviewed and discussed with Sonia Side Amerman certain preventive protocols, quality metrics, and best practice recommendations. A written personalized care plan for preventive services as well as general preventive health recommendations is available and can be mailed to the patient at her request.      Wardell Heath, LPN  05/24/4973

## 2018-09-10 NOTE — Patient Instructions (Signed)
[name] , Thank you for taking time to come for your Medicare Wellness Visit. I appreciate your ongoing commitment to your health goals. Please review the following plan we discussed and let me know if I can assist you in the future.   These are the goals we discussed: Goals    . Exercise 3x per week (30 min per time)     Try to exercise for 30 minutes, 3 times weekly       This is a list of the screening recommended for you and due dates:  Health Maintenance  Topic Date Due  . Tetanus Vaccine  [birth year]17/1964  . Stool Blood Test  09/17/1996  . Eye exam for diabetics  09/02/2018  . Mammogram  12/18/2026*  . Flu Shot  10/30/2018  . Hemoglobin A1C  11/25/2018  . Complete foot exam   01/08/2019  . Colon Cancer Screening  01/25/2020  . DEXA scan (bone density measurement)  Completed  .  Hepatitis C: One time screening is recommended by Center for Disease Control  (CDC) for  adults born from [birth year] through 1964.   Completed  . Pneumonia vaccines  Completed  *Topic was postponed. The date shown is not the original due date.     Advance Directive  Advance directives are legal documents that let you make choices ahead of time about your health care and medical treatment in case you become unable to communicate for yourself. Advance directives are a way for you to communicate your wishes to family, friends, and health care providers. This can help convey your decisions about end-of-life care if you become unable to communicate. Discussing and writing advance directives should happen over time rather than all at once. Advance directives can be changed depending on your situation and what you want, even after you have signed the advance directives. If you do not have an advance directive, some states assign family decision makers to act on your behalf based on how closely you are related to them. Each state has its own laws regarding advance directives. You may want to check with your health care  provider, attorney, or state representative about the laws in your state. There are different types of advance directives, such as:  Medical power of attorney.  Living will.  Do not resuscitate (DNR) or do not attempt resuscitation (DNAR) order. Health care proxy and medical power of attorney A health care proxy, also called a health care agent, is a person who is appointed to make medical decisions for you in cases in which you are unable to make the decisions yourself. Generally, people choose someone they know well and trust to represent their preferences. Make sure to ask this person for an agreement to act as your proxy. A proxy may have to exercise judgment in the event of a medical decision for which your wishes are not known. A medical power of attorney is a legal document that names your health care proxy. Depending on the laws in your state, after the document is written, it may also need to be:  Signed.  Notarized.  Dated.  Copied.  Witnessed.  Incorporated into your medical record. You may also want to appoint someone to manage your financial affairs in a situation in which you are unable to do so. This is called a durable power of attorney for finances. It is a separate legal document from the durable power of attorney for health care. You may choose the same person or someone different from  your health care proxy to act as your agent in financial matters. If you do not appoint a proxy, or if there is a concern that the proxy is not acting in your best interests, a court-appointed guardian may be designated to act on your behalf. Living will A living will is a set of instructions documenting your wishes about medical care when you cannot express them yourself. Health care providers should keep a copy of your living will in your medical record. You may want to give a copy to family members or friends. To alert caregivers in case of an emergency, you can place a card in your  wallet to let them know that you have a living will and where they can find it. A living will is used if you become:  Terminally ill.  Incapacitated.  Unable to communicate or make decisions. Items to consider in your living will include:  The use or non-use of life-sustaining equipment, such as dialysis machines and breathing machines (ventilators).  A DNR or DNAR order, which is the instruction not to use cardiopulmonary resuscitation (CPR) if breathing or heartbeat stops.  The use or non-use of tube feeding.  Withholding of food and fluids.  Comfort (palliative) care when the goal becomes comfort rather than a cure.  Organ and tissue donation. A living will does not give instructions for distributing your money and property if you should pass away. It is recommended that you seek the advice of a lawyer when writing a will. Decisions about taxes, beneficiaries, and asset distribution will be legally binding. This process can relieve your family and friends of any concerns surrounding disputes or questions that may come up about the distribution of your assets. DNR or DNAR A DNR or DNAR order is a request not to have CPR in the event that your heart stops beating or you stop breathing. If a DNR or DNAR order has not been made and shared, a health care provider will try to help any patient whose heart has stopped or who has stopped breathing. If you plan to have surgery, talk with your health care provider about how your DNR or DNAR order will be followed if problems occur. Summary  Advance directives are the legal documents that allow you to make choices ahead of time about your health care and medical treatment in case you become unable to communicate for yourself.  The process of discussing and writing advance directives should happen over time. You can change the advance directives, even after you have signed them.  Advance directives include DNR or DNAR orders, living wills, and  designating an agent as your medical power of attorney. This information is not intended to replace advice given to you by your health care provider. Make sure you discuss any questions you have with your health care provider. Document Released: 06/24/2007 Document Revised: 02/04/2016 Document Reviewed: 02/04/2016 Elsevier Interactive Patient Education  2019 [employer].

## 2018-09-16 ENCOUNTER — Other Ambulatory Visit: Payer: Self-pay | Admitting: Family

## 2018-09-22 DIAGNOSIS — H25812 Combined forms of age-related cataract, left eye: Secondary | ICD-10-CM | POA: Diagnosis not present

## 2018-09-22 DIAGNOSIS — Z01818 Encounter for other preprocedural examination: Secondary | ICD-10-CM | POA: Diagnosis not present

## 2018-09-22 DIAGNOSIS — H25811 Combined forms of age-related cataract, right eye: Secondary | ICD-10-CM | POA: Diagnosis not present

## 2018-09-29 DIAGNOSIS — Z5181 Encounter for therapeutic drug level monitoring: Secondary | ICD-10-CM | POA: Diagnosis not present

## 2018-09-29 DIAGNOSIS — Z79891 Long term (current) use of opiate analgesic: Secondary | ICD-10-CM | POA: Diagnosis not present

## 2018-09-29 DIAGNOSIS — E1140 Type 2 diabetes mellitus with diabetic neuropathy, unspecified: Secondary | ICD-10-CM | POA: Diagnosis not present

## 2018-09-29 DIAGNOSIS — G894 Chronic pain syndrome: Secondary | ICD-10-CM | POA: Diagnosis not present

## 2018-09-30 DIAGNOSIS — H25811 Combined forms of age-related cataract, right eye: Secondary | ICD-10-CM | POA: Diagnosis not present

## 2018-09-30 DIAGNOSIS — H2511 Age-related nuclear cataract, right eye: Secondary | ICD-10-CM | POA: Diagnosis not present

## 2018-10-07 DIAGNOSIS — H2512 Age-related nuclear cataract, left eye: Secondary | ICD-10-CM | POA: Diagnosis not present

## 2018-10-07 DIAGNOSIS — H25812 Combined forms of age-related cataract, left eye: Secondary | ICD-10-CM | POA: Diagnosis not present

## 2018-10-27 ENCOUNTER — Other Ambulatory Visit: Payer: Self-pay | Admitting: Family

## 2018-10-27 DIAGNOSIS — Z79891 Long term (current) use of opiate analgesic: Secondary | ICD-10-CM | POA: Diagnosis not present

## 2018-10-27 DIAGNOSIS — G894 Chronic pain syndrome: Secondary | ICD-10-CM | POA: Diagnosis not present

## 2018-10-27 DIAGNOSIS — E1140 Type 2 diabetes mellitus with diabetic neuropathy, unspecified: Secondary | ICD-10-CM | POA: Diagnosis not present

## 2018-10-27 DIAGNOSIS — Z5181 Encounter for therapeutic drug level monitoring: Secondary | ICD-10-CM | POA: Diagnosis not present

## 2018-11-24 ENCOUNTER — Other Ambulatory Visit: Payer: Self-pay

## 2018-11-24 DIAGNOSIS — Z5181 Encounter for therapeutic drug level monitoring: Secondary | ICD-10-CM | POA: Diagnosis not present

## 2018-11-24 DIAGNOSIS — G8929 Other chronic pain: Secondary | ICD-10-CM | POA: Diagnosis not present

## 2018-11-24 DIAGNOSIS — Z79891 Long term (current) use of opiate analgesic: Secondary | ICD-10-CM | POA: Diagnosis not present

## 2018-11-24 DIAGNOSIS — E1140 Type 2 diabetes mellitus with diabetic neuropathy, unspecified: Secondary | ICD-10-CM | POA: Diagnosis not present

## 2018-11-24 DIAGNOSIS — M4807 Spinal stenosis, lumbosacral region: Secondary | ICD-10-CM | POA: Diagnosis not present

## 2018-11-24 DIAGNOSIS — M47897 Other spondylosis, lumbosacral region: Secondary | ICD-10-CM | POA: Diagnosis not present

## 2018-11-24 DIAGNOSIS — M25559 Pain in unspecified hip: Secondary | ICD-10-CM | POA: Diagnosis not present

## 2018-11-24 DIAGNOSIS — M25519 Pain in unspecified shoulder: Secondary | ICD-10-CM | POA: Diagnosis not present

## 2018-11-24 DIAGNOSIS — M4726 Other spondylosis with radiculopathy, lumbar region: Secondary | ICD-10-CM | POA: Diagnosis not present

## 2018-11-24 DIAGNOSIS — G894 Chronic pain syndrome: Secondary | ICD-10-CM | POA: Diagnosis not present

## 2018-11-24 DIAGNOSIS — M48062 Spinal stenosis, lumbar region with neurogenic claudication: Secondary | ICD-10-CM | POA: Diagnosis not present

## 2018-11-24 DIAGNOSIS — M792 Neuralgia and neuritis, unspecified: Secondary | ICD-10-CM | POA: Diagnosis not present

## 2018-11-25 ENCOUNTER — Encounter: Payer: Self-pay | Admitting: Family

## 2018-11-25 ENCOUNTER — Ambulatory Visit (INDEPENDENT_AMBULATORY_CARE_PROVIDER_SITE_OTHER): Payer: Medicare Other | Admitting: Family

## 2018-11-25 VITALS — BP 121/73 | HR 57 | Temp 98.0°F | Ht 62.0 in | Wt 160.8 lb

## 2018-11-25 DIAGNOSIS — R5383 Other fatigue: Secondary | ICD-10-CM

## 2018-11-25 DIAGNOSIS — E663 Overweight: Secondary | ICD-10-CM | POA: Diagnosis not present

## 2018-11-25 DIAGNOSIS — G8929 Other chronic pain: Secondary | ICD-10-CM

## 2018-11-25 DIAGNOSIS — K219 Gastro-esophageal reflux disease without esophagitis: Secondary | ICD-10-CM | POA: Diagnosis not present

## 2018-11-25 DIAGNOSIS — M545 Low back pain, unspecified: Secondary | ICD-10-CM

## 2018-11-25 DIAGNOSIS — E1169 Type 2 diabetes mellitus with other specified complication: Secondary | ICD-10-CM | POA: Diagnosis not present

## 2018-11-25 DIAGNOSIS — I700 Atherosclerosis of aorta: Secondary | ICD-10-CM | POA: Diagnosis not present

## 2018-11-25 DIAGNOSIS — E785 Hyperlipidemia, unspecified: Secondary | ICD-10-CM

## 2018-11-25 DIAGNOSIS — F331 Major depressive disorder, recurrent, moderate: Secondary | ICD-10-CM

## 2018-11-25 DIAGNOSIS — F411 Generalized anxiety disorder: Secondary | ICD-10-CM | POA: Diagnosis not present

## 2018-11-25 DIAGNOSIS — R6889 Other general symptoms and signs: Secondary | ICD-10-CM | POA: Diagnosis not present

## 2018-11-25 DIAGNOSIS — E119 Type 2 diabetes mellitus without complications: Secondary | ICD-10-CM | POA: Diagnosis not present

## 2018-11-25 LAB — BAYER DCA HB A1C WAIVED: HB A1C (BAYER DCA - WAIVED): 6.9 % (ref <7.0)

## 2018-11-25 MED ORDER — ESCITALOPRAM OXALATE 10 MG PO TABS: 10.0000 mg | ORAL_TABLET | Freq: Every day | ORAL | 3 refills | Status: DC

## 2018-11-25 MED ORDER — DULOXETINE HCL 60 MG PO CPEP: 60.0000 mg | ORAL_CAPSULE | Freq: Every day | ORAL | 1 refills | Status: DC

## 2018-11-25 NOTE — Progress Notes (Signed)
Subjective:    Patient ID: Sonya Cook, female    DOB: 11/12/1945, 73 y.o.   MRN: [id number]  Chief Complaint  Patient presents with  . Medical Management of Chronic Issues   PT presents to the office today for chronic follow up.PT is followed by Pain Management for chronic back pain. Hypertension This is a chronic problem. The current episode started more than 1 year ago. The problem has been resolved since onset. The problem is controlled. Associated symptoms include headaches, malaise/fatigue and shortness of breath ("at times"). Pertinent negatives include no blurred vision or peripheral edema. Risk factors for coronary artery disease include dyslipidemia, diabetes mellitus and sedentary lifestyle. The current treatment provides moderate improvement. There is no history of kidney disease, CAD/MI, CVA or heart failure.  Gastroesophageal Reflux She reports no belching, no coughing or no heartburn. This is a chronic problem. The current episode started more than 1 year ago. The problem occurs occasionally. The problem has been waxing and waning. The symptoms are aggravated by certain foods. She has tried a PPI for the symptoms. The treatment provided moderate relief.  Diabetes She presents for her follow-up diabetic visit. She has type 2 diabetes mellitus. Her disease course has been stable. Hypoglycemia symptoms include headaches. Associated symptoms include weakness. Pertinent negatives for diabetes include no blurred vision and no foot paresthesias. Symptoms are stable. Pertinent negatives for diabetic complications include no CVA or heart disease. Risk factors for coronary artery disease include dyslipidemia, diabetes mellitus, hypertension, sedentary lifestyle and post-menopausal. She is following a generally healthy diet. Her bedtime blood glucose range is generally 130-140 mg/dl. Eye exam is current.  Hyperlipidemia This is a chronic problem. The current episode started more than 1  year ago. The problem is controlled. Recent lipid tests were reviewed and are normal. Exacerbating diseases include obesity. Associated symptoms include shortness of breath ("at times"). Current antihyperlipidemic treatment includes statins. The current treatment provides moderate improvement of lipids. Risk factors for coronary artery disease include dyslipidemia, hypertension, post-menopausal and a sedentary lifestyle.  Depression        This is a chronic problem.  The current episode started more than 1 year ago.   The onset quality is gradual.   The problem has been waxing and waning since onset.  Associated symptoms include helplessness, hopelessness, irritable, restlessness, headaches and sad.  Past treatments include SSRIs - Selective serotonin reuptake inhibitors. Back Pain This is a chronic problem. The current episode started more than 1 year ago. The problem occurs intermittently. The problem has been waxing and waning since onset. The pain is present in the lumbar spine. The quality of the pain is described as aching. The pain is moderate. Associated symptoms include headaches and weakness. Pertinent negatives include no bladder incontinence or bowel incontinence. The treatment provided moderate relief.      Review of Systems  Constitutional: Positive for malaise/fatigue.  Eyes: Negative for blurred vision.  Respiratory: Positive for shortness of breath ("at times"). Negative for cough.   Gastrointestinal: Negative for bowel incontinence and heartburn.  Genitourinary: Negative for bladder incontinence.  Musculoskeletal: Positive for back pain.  Neurological: Positive for weakness and headaches.  Psychiatric/Behavioral: Positive for depression.  All other systems reviewed and are negative.      Objective:   Physical Exam Vitals signs reviewed.  Constitutional:      General: She is irritable. She is not in acute distress.    Appearance: She is well-developed.  HENT:     Head:  Normocephalic  and atraumatic.     Right Ear: Tympanic membrane normal.     Left Ear: Tympanic membrane normal.  Eyes:     Pupils: Pupils are equal, round, and reactive to light.  Neck:     Musculoskeletal: Normal range of motion and neck supple.     Thyroid: No thyromegaly.  Cardiovascular:     Rate and Rhythm: Normal rate and regular rhythm.     Heart sounds: Normal heart sounds. No murmur.  Pulmonary:     Effort: Pulmonary effort is normal. No respiratory distress.     Breath sounds: Normal breath sounds. No wheezing.  Abdominal:     General: Bowel sounds are normal. There is no distension.     Palpations: Abdomen is soft.     Tenderness: There is no abdominal tenderness.  Musculoskeletal: Normal range of motion.        General: No tenderness.  Skin:    General: Skin is warm and dry.  Neurological:     Mental Status: She is alert and oriented to person, place, and time.     Cranial Nerves: No cranial nerve deficit.     Deep Tendon Reflexes: Reflexes are normal and symmetric.  Psychiatric:        Mood and Affect: Affect is tearful.        Behavior: Behavior normal.        Thought Content: Thought content normal.        Judgment: Judgment normal.        BP 121/73   Pulse (!) 57   Temp 98 F (36.7 C) (Temporal)   Ht 5' 2"  (1.575 m)   Wt 160 lb 12.8 oz (72.9 kg)   BMI 29.41 kg/m   Assessment & Plan:  Sonya Cook comes in today with chief complaint of Medical Management of Chronic Issues   Diagnosis and orders addressed:  1. Gastroesophageal reflux disease, esophagitis presence not specified - CMP14+EGFR  2. Type 2 diabetes mellitus with other specified complication, without long-term current use of insulin (HCC) - Bayer DCA Hb A1c Waived - CMP14+EGFR - Microalbumin / creatinine urine ratio  3. Aortic atherosclerosis (HCC) - CMP14+EGFR - Lipid panel - Anemia Profile B  4. Chronic low back pain, unspecified back pain laterality, unspecified whether  sciatica present  - CMP14+EGFR - Anemia Profile B  5. Moderate episode of recurrent major depressive disorder (HCC) Will start Cymbalta 60 mg and decrease lexapro to 10 mg for two weeks then stop Stress management discussed  - DULoxetine (CYMBALTA) 60 MG capsule; Take 1 capsule (60 mg total) by mouth daily.  Dispense: 90 capsule; Refill: 1 - escitalopram (LEXAPRO) 10 MG tablet; Take 1 tablet (10 mg total) by mouth daily.  Dispense: 90 tablet; Refill: 3 - CMP14+EGFR  6. Hyperlipidemia, unspecified hyperlipidemia type - CMP14+EGFR - Lipid panel  7. Overweight (BMI 25.0-29.9) - CMP14+EGFR  8. GAD (generalized anxiety disorder) - DULoxetine (CYMBALTA) 60 MG capsule; Take 1 capsule (60 mg total) by mouth daily.  Dispense: 90 capsule; Refill: 1 - escitalopram (LEXAPRO) 10 MG tablet; Take 1 tablet (10 mg total) by mouth daily.  Dispense: 90 tablet; Refill: 3 - CMP14+EGFR  9. Fatigue, unspecified type - CMP14+EGFR - TSH - Anemia Profile B   Labs pending Health Maintenance reviewed Diet and exercise encouraged  Follow up plan: 6 months   Evelina Dun, FNP

## 2018-11-25 NOTE — Patient Instructions (Signed)

## 2018-11-25 NOTE — Addendum Note (Signed)
Addended by: Evelina Dun A on: 11/25/2018 03:07 PM   Modules accepted: Orders

## 2018-11-26 ENCOUNTER — Other Ambulatory Visit: Payer: Self-pay | Admitting: Family

## 2018-11-26 LAB — LIPID PANEL
Chol/HDL Ratio: 4 ratio (ref 0.0–4.4)
Cholesterol, Total: 143 mg/dL (ref 100–199)
HDL: 36 mg/dL — ABNORMAL LOW (ref >39)
LDL Calculated: 51 mg/dL (ref 0–99)
Triglycerides: 280 mg/dL — ABNORMAL HIGH (ref 0–149)
VLDL Cholesterol Cal: 56 mg/dL — ABNORMAL HIGH (ref 5–40)

## 2018-11-26 LAB — CMP14+EGFR
ALT: 17 IU/L (ref 0–32)
AST: 22 IU/L (ref 0–40)
Albumin/Globulin Ratio: 2.4 — ABNORMAL HIGH (ref 1.2–2.2)
Albumin: 4.6 g/dL (ref 3.7–4.7)
Alkaline Phosphatase: 52 IU/L (ref 39–117)
BUN/Creatinine Ratio: 21 (ref 12–28)
BUN: 23 mg/dL (ref 8–27)
Bilirubin Total: 0.3 mg/dL (ref 0.0–1.2)
CO2: 28 mmol/L (ref 20–29)
Calcium: 9.8 mg/dL (ref 8.7–10.3)
Chloride: 97 mmol/L (ref 96–106)
Creatinine, Ser: 1.07 mg/dL — ABNORMAL HIGH (ref 0.57–1.00)
GFR calc Af Amer: 60 mL/min/{1.73_m2} (ref >59)
GFR calc non Af Amer: 52 mL/min/{1.73_m2} — ABNORMAL LOW (ref >59)
Globulin, Total: 1.9 g/dL (ref 1.5–4.5)
Glucose: 140 mg/dL — ABNORMAL HIGH (ref 65–99)
Potassium: 5.4 mmol/L — ABNORMAL HIGH (ref 3.5–5.2)
Sodium: 139 mmol/L (ref 134–144)
Total Protein: 6.5 g/dL (ref 6.0–8.5)

## 2018-11-26 LAB — ANEMIA PROFILE B
Basophils Absolute: 0.1 10*3/uL (ref 0.0–0.2)
Basos: 1 %
EOS (ABSOLUTE): 0.2 10*3/uL (ref 0.0–0.4)
Eos: 3 %
Ferritin: 64 ng/mL (ref 15–150)
Folate: 8.9 ng/mL (ref >3.0)
Hematocrit: 38.4 % (ref 34.0–46.6)
Hemoglobin: 13 g/dL (ref 11.1–15.9)
Immature Grans (Abs): 0 10*3/uL (ref 0.0–0.1)
Immature Granulocytes: 0 %
Iron Saturation: 33 % (ref 15–55)
Iron: 92 ug/dL (ref 27–139)
Lymphocytes Absolute: 2.8 10*3/uL (ref 0.7–3.1)
Lymphs: 41 %
MCH: 32 pg (ref 26.6–33.0)
MCHC: 33.9 g/dL (ref 31.5–35.7)
MCV: 95 fL (ref 79–97)
Monocytes Absolute: 0.6 10*3/uL (ref 0.1–0.9)
Monocytes: 9 %
Neutrophils Absolute: 3.2 10*3/uL (ref 1.4–7.0)
Neutrophils: 46 %
Platelets: 189 10*3/uL (ref 150–450)
RBC: 4.06 x10E6/uL (ref 3.77–5.28)
RDW: 12.1 % (ref 11.7–15.4)
Retic Ct Pct: 2.1 % (ref 0.6–2.6)
Total Iron Binding Capacity: 276 ug/dL (ref 250–450)
UIBC: 184 ug/dL (ref 118–369)
Vitamin B-12: 501 pg/mL (ref 232–1245)
WBC: 6.9 10*3/uL (ref 3.4–10.8)

## 2018-11-26 LAB — MICROALBUMIN / CREATININE URINE RATIO
Creatinine, Urine: 90.7 mg/dL
Microalb/Creat Ratio: 5 mg/g creat (ref 0–29)
Microalbumin, Urine: 4.8 ug/mL

## 2018-11-26 LAB — TSH: TSH: 1.34 u[IU]/mL (ref 0.450–4.500)

## 2019-01-14 ENCOUNTER — Other Ambulatory Visit: Payer: Self-pay | Admitting: Family

## 2019-01-18 DIAGNOSIS — G894 Chronic pain syndrome: Secondary | ICD-10-CM | POA: Diagnosis not present

## 2019-01-22 DIAGNOSIS — Z23 Encounter for immunization: Secondary | ICD-10-CM | POA: Diagnosis not present

## 2019-02-14 ENCOUNTER — Other Ambulatory Visit: Payer: Self-pay | Admitting: Family

## 2019-02-14 DIAGNOSIS — G894 Chronic pain syndrome: Secondary | ICD-10-CM | POA: Diagnosis not present

## 2019-02-14 DIAGNOSIS — F331 Major depressive disorder, recurrent, moderate: Secondary | ICD-10-CM

## 2019-02-14 DIAGNOSIS — F411 Generalized anxiety disorder: Secondary | ICD-10-CM

## 2019-02-15 ENCOUNTER — Telehealth: Payer: Self-pay | Admitting: Family

## 2019-02-15 NOTE — Telephone Encounter (Signed)
Patient says she was seen by [facility] on 11/25/18 and was prescribed 60 mg Cymbalta and 5mg  generic Lexapro. Pt was told to take the 5 mg generic Lexapro just the 1st month while taking the Cymbalta and could wing off of it by the end of the month which pt says she did. Pt says she feels better though when taking it. Wants to know if she can continue to do so?? Pt says she has 10mg  of generic lexapro on hand that she can take if its ok.

## 2019-02-16 NOTE — Telephone Encounter (Signed)
We shouldn't mix the two. Hopefully, after 6 weeks the Cymbalta is at therapeutic levels and will feel better.

## 2019-02-16 NOTE — Telephone Encounter (Signed)
Aware of provider's advice. 

## 2019-02-16 NOTE — Telephone Encounter (Signed)
Left message to please call our office. 

## 2019-02-17 ENCOUNTER — Other Ambulatory Visit: Payer: Self-pay | Admitting: Family

## 2019-03-05 IMAGING — DX DG CHEST 2V
2 series · 2 of 2 positions shown · non-contrast
Comparison: CT chest and chest radiograph 09/17/2010.

CLINICAL DATA: Preop.

EXAM:
CHEST  2 VIEW

[chest pa]
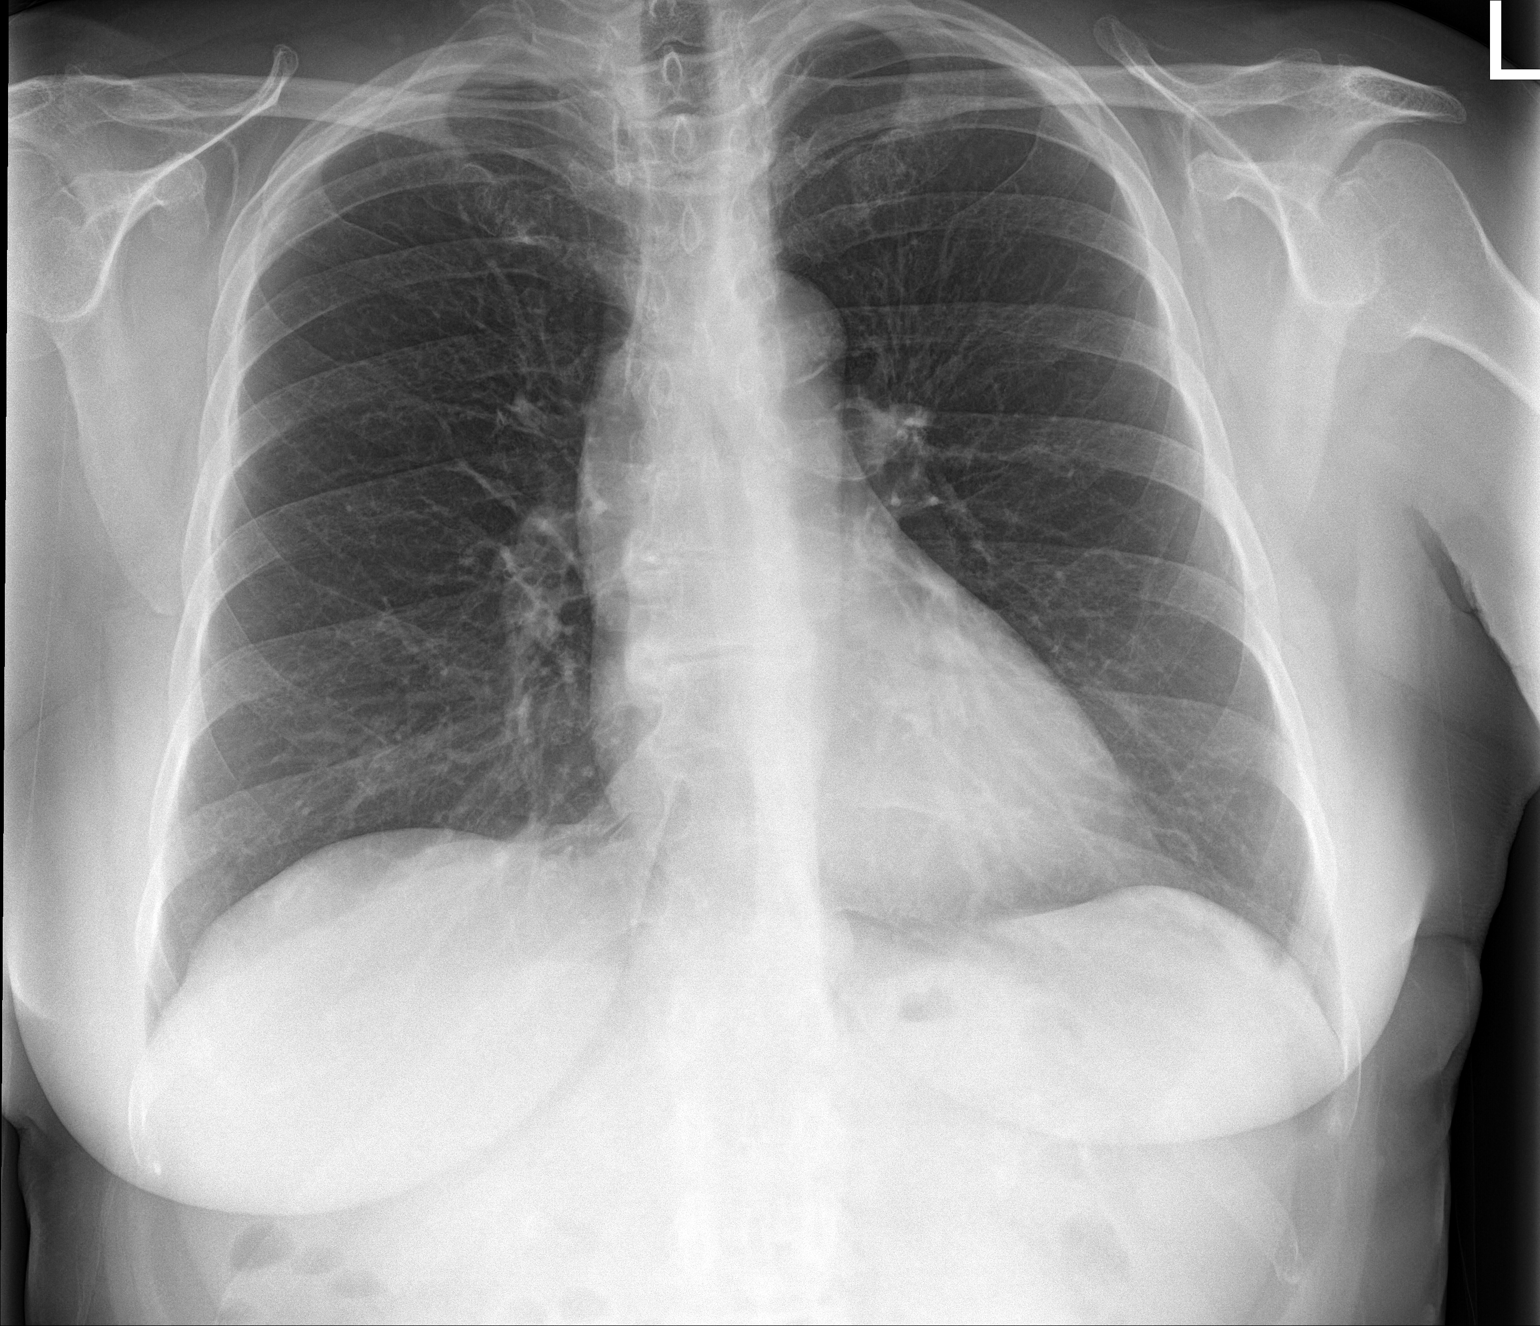

[chest lat]
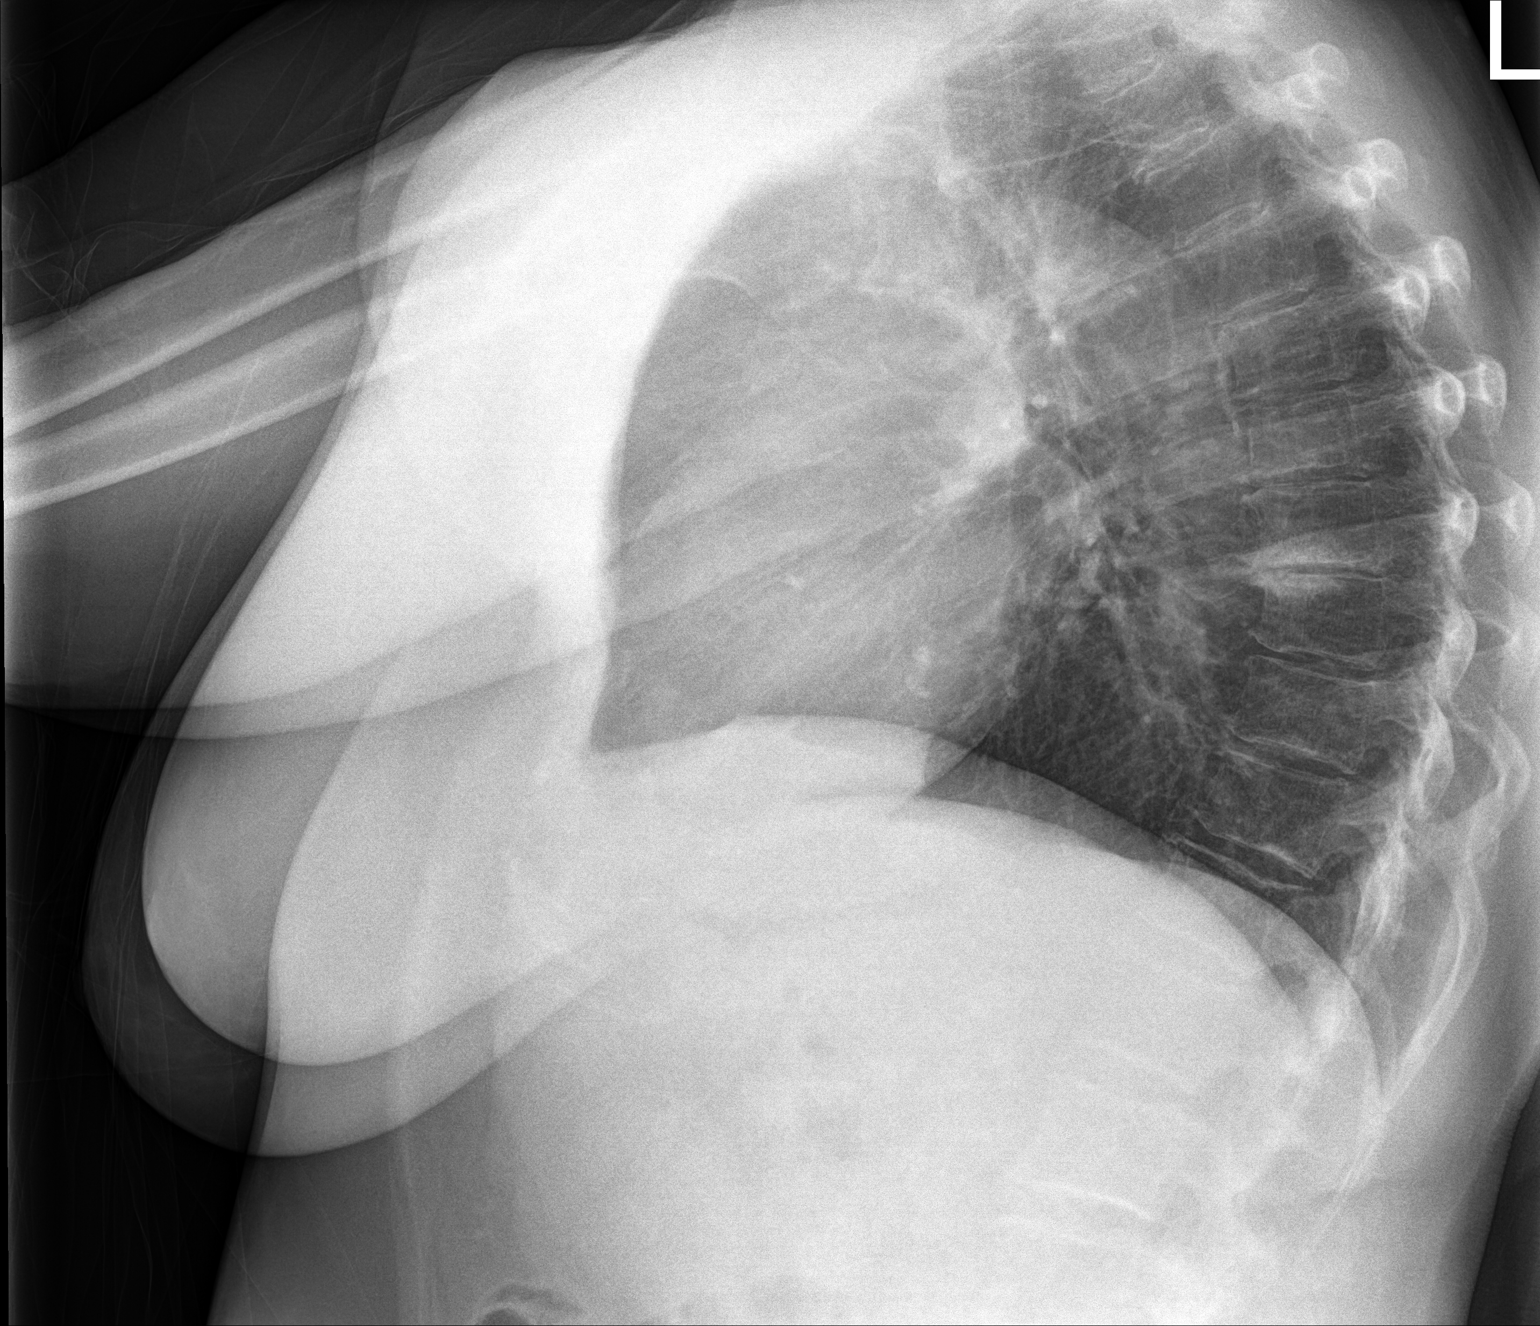

[2 of 2 positions shown; findings below may reference images not displayed]

FINDINGS: Trachea is midline. Heart size normal. Thoracic aorta is calcified.
Lungs are clear. No pleural fluid.
IMPRESSION: 1. No acute findings.
2.  Aortic atherosclerosis (Y43O0-170.0).

## 2019-03-14 ENCOUNTER — Other Ambulatory Visit: Payer: Self-pay | Admitting: Family

## 2019-03-14 DIAGNOSIS — G894 Chronic pain syndrome: Secondary | ICD-10-CM | POA: Diagnosis not present

## 2019-04-11 DIAGNOSIS — Z5181 Encounter for therapeutic drug level monitoring: Secondary | ICD-10-CM | POA: Diagnosis not present

## 2019-04-11 DIAGNOSIS — E1140 Type 2 diabetes mellitus with diabetic neuropathy, unspecified: Secondary | ICD-10-CM | POA: Diagnosis not present

## 2019-04-11 DIAGNOSIS — G894 Chronic pain syndrome: Secondary | ICD-10-CM | POA: Diagnosis not present

## 2019-04-11 DIAGNOSIS — Z79891 Long term (current) use of opiate analgesic: Secondary | ICD-10-CM | POA: Diagnosis not present

## 2019-05-03 ENCOUNTER — Other Ambulatory Visit: Payer: Self-pay | Admitting: Family

## 2019-05-09 DIAGNOSIS — G894 Chronic pain syndrome: Secondary | ICD-10-CM | POA: Diagnosis not present

## 2019-05-09 DIAGNOSIS — E1140 Type 2 diabetes mellitus with diabetic neuropathy, unspecified: Secondary | ICD-10-CM | POA: Diagnosis not present

## 2019-05-09 DIAGNOSIS — Z5181 Encounter for therapeutic drug level monitoring: Secondary | ICD-10-CM | POA: Diagnosis not present

## 2019-05-09 DIAGNOSIS — Z79891 Long term (current) use of opiate analgesic: Secondary | ICD-10-CM | POA: Diagnosis not present

## 2019-06-06 DIAGNOSIS — M25559 Pain in unspecified hip: Secondary | ICD-10-CM | POA: Diagnosis not present

## 2019-06-06 DIAGNOSIS — G894 Chronic pain syndrome: Secondary | ICD-10-CM | POA: Diagnosis not present

## 2019-06-06 DIAGNOSIS — M25519 Pain in unspecified shoulder: Secondary | ICD-10-CM | POA: Diagnosis not present

## 2019-06-06 DIAGNOSIS — M47897 Other spondylosis, lumbosacral region: Secondary | ICD-10-CM | POA: Diagnosis not present

## 2019-06-06 DIAGNOSIS — I119 Hypertensive heart disease without heart failure: Secondary | ICD-10-CM | POA: Diagnosis not present

## 2019-06-06 DIAGNOSIS — F419 Anxiety disorder, unspecified: Secondary | ICD-10-CM | POA: Diagnosis not present

## 2019-06-06 DIAGNOSIS — G4700 Insomnia, unspecified: Secondary | ICD-10-CM | POA: Diagnosis not present

## 2019-06-06 DIAGNOSIS — M5136 Other intervertebral disc degeneration, lumbar region: Secondary | ICD-10-CM | POA: Diagnosis not present

## 2019-06-06 DIAGNOSIS — M48062 Spinal stenosis, lumbar region with neurogenic claudication: Secondary | ICD-10-CM | POA: Diagnosis not present

## 2019-06-06 DIAGNOSIS — M545 Low back pain: Secondary | ICD-10-CM | POA: Diagnosis not present

## 2019-06-06 DIAGNOSIS — Z5181 Encounter for therapeutic drug level monitoring: Secondary | ICD-10-CM | POA: Diagnosis not present

## 2019-06-06 DIAGNOSIS — M5416 Radiculopathy, lumbar region: Secondary | ICD-10-CM | POA: Diagnosis not present

## 2019-06-06 DIAGNOSIS — R5382 Chronic fatigue, unspecified: Secondary | ICD-10-CM | POA: Diagnosis not present

## 2019-06-06 DIAGNOSIS — E1140 Type 2 diabetes mellitus with diabetic neuropathy, unspecified: Secondary | ICD-10-CM | POA: Diagnosis not present

## 2019-06-06 DIAGNOSIS — Z79891 Long term (current) use of opiate analgesic: Secondary | ICD-10-CM | POA: Diagnosis not present

## 2019-06-07 ENCOUNTER — Telehealth: Payer: Self-pay | Admitting: Family

## 2019-06-07 NOTE — Chronic Care Management (AMB) (Signed)
Chronic Care Management  ° °Note ° °06/07/2019 °Name: Sonya Cook MRN: [id number] DOB: 03/13/1946 ° °Sonya Cook is a 73 y.o. year old female who is a primary care patient of Hawks, Christy A, FNP. I reached out to Sonya Cook by phone today in response to a referral sent by [name]'s health plan.    ° °Sonya Cook was given information about Chronic Care Management services today including:  °1. CCM service includes personalized support from designated clinical staff supervised by her physician, including individualized plan of care and coordination with other care providers °2. 24/7 contact phone numbers for assistance for urgent and routine care needs. °3. Service will only be billed when office clinical staff spend 20 minutes or more in a month to coordinate care. °4. Only one practitioner may furnish and bill the service in a calendar month. °5. The patient may stop CCM services at any time (effective at the end of the month) by phone call to the office staff. °6. The patient will be responsible for cost sharing (co-pay) of up to 20% of the service fee (after annual deductible is met). ° °Patient agreed to services and verbal consent obtained.  ° °Follow up plan: °Telephone appointment with care management team member scheduled for:08/24/2019 ° °Amber Wray, RMA °Care Guide, Embedded Care Coordination °[facility]  °[place], [place] [address] °Direct Dial: [phone] °[email or web address] °Website: [email or web address]  °

## 2019-06-09 ENCOUNTER — Other Ambulatory Visit: Payer: Self-pay | Admitting: Family

## 2019-06-09 DIAGNOSIS — F411 Generalized anxiety disorder: Secondary | ICD-10-CM

## 2019-06-09 DIAGNOSIS — F331 Major depressive disorder, recurrent, moderate: Secondary | ICD-10-CM

## 2019-07-04 DIAGNOSIS — M545 Low back pain: Secondary | ICD-10-CM | POA: Diagnosis not present

## 2019-07-04 DIAGNOSIS — G894 Chronic pain syndrome: Secondary | ICD-10-CM | POA: Diagnosis not present

## 2019-07-04 DIAGNOSIS — M5136 Other intervertebral disc degeneration, lumbar region: Secondary | ICD-10-CM | POA: Diagnosis not present

## 2019-07-05 DIAGNOSIS — G894 Chronic pain syndrome: Secondary | ICD-10-CM | POA: Diagnosis not present

## 2019-08-01 DIAGNOSIS — Z79891 Long term (current) use of opiate analgesic: Secondary | ICD-10-CM | POA: Diagnosis not present

## 2019-08-01 DIAGNOSIS — E1140 Type 2 diabetes mellitus with diabetic neuropathy, unspecified: Secondary | ICD-10-CM | POA: Diagnosis not present

## 2019-08-01 DIAGNOSIS — G894 Chronic pain syndrome: Secondary | ICD-10-CM | POA: Diagnosis not present

## 2019-08-01 DIAGNOSIS — M5126 Other intervertebral disc displacement, lumbar region: Secondary | ICD-10-CM | POA: Diagnosis not present

## 2019-08-08 ENCOUNTER — Other Ambulatory Visit: Payer: Self-pay | Admitting: Family

## 2019-08-19 ENCOUNTER — Other Ambulatory Visit: Payer: Self-pay | Admitting: Family

## 2019-08-23 DIAGNOSIS — Z23 Encounter for immunization: Secondary | ICD-10-CM | POA: Diagnosis not present

## 2019-08-24 ENCOUNTER — Ambulatory Visit (INDEPENDENT_AMBULATORY_CARE_PROVIDER_SITE_OTHER): Payer: Medicare Other | Admitting: *Deleted

## 2019-08-24 ENCOUNTER — Telehealth: Payer: Self-pay | Admitting: *Deleted

## 2019-08-24 DIAGNOSIS — F331 Major depressive disorder, recurrent, moderate: Secondary | ICD-10-CM

## 2019-08-24 DIAGNOSIS — I10 Essential (primary) hypertension: Secondary | ICD-10-CM

## 2019-08-24 DIAGNOSIS — E1169 Type 2 diabetes mellitus with other specified complication: Secondary | ICD-10-CM

## 2019-08-24 NOTE — Patient Instructions (Signed)
Visit Information  Goals Addressed            This Visit's Progress     Patient Stated   . "I want to make sure I'm eating the right things" (pt-stated)       CARE PLAN ENTRY (see longitudinal plan of care for additional care plan information)  Current Barriers:  . Care Coordination needs related to diet/nutrition in a patient with diabetes (disease states)  Nurse Case Manager Clinical Goal(s):  Marland Kitchen Over the next 60 days, patient will work with Consulting civil engineer to address needs related to diet/nutrition . Over the next 60 days, patient will talk with dietician/nutritionist regarding a vegetarian diet while managing diabetes  Interventions:  . Inter-disciplinary care team collaboration (see longitudinal plan of care) . Chart reviewed including recent office notes and lab results . Reviewed upcoming appointments: Evelina Dun, FNP 09/05/19 and AWV on 09/12/19 . Discussed diet and diabetes management o Patient prefers a vegetarian diet but is finding it harder to manage her blood sugar levels since eliminating meat . Discussed a dietician/nutrition referral and patient is agreeable o Will send request to PCP o Patient prefers [place] location . Provided with RN Care Manager contact information and encouraged to reach out as needed  Patient Self Care Activities:  . Performs ADL's independently . Performs IADL's independently  Initial goal documentation       Other   . Chronic Disease Management Needs       CARE PLAN ENTRY (see longtitudinal plan of care for additional care plan information)  Current Barriers:  . Chronic Disease Management support, education, and care coordination needs related to HTN, DM, depression, osteopenia, anxiety  Clinical Goal(s) related to HTN, DM, depression, osteopenia, anxiety:  Over the next 60 days, patient will:  . Work with the care management team to address educational, disease management, and care coordination needs  . Begin or continue  self health monitoring activities as directed today Measure and record cbg (blood glucose) 1 to 2 times daily and Measure and record blood pressure 1 times per week . Call provider office for new or worsened signs and symptoms Blood glucose findings outside established parameters and Blood pressure findings outside established parameters . Call care management team with questions or concerns . Verbalize basic understanding of patient centered plan of care established today  Interventions related to HTN, DM, depression, osteopenia, anxiety:  . Evaluation of current treatment plans and patient's adherence to plan as established by provider . Assessed patient understanding of disease states . Assessed patient's education and care coordination needs . Provided disease specific education to patient  . Collaborated with appropriate clinical care team members regarding patient needs . Encouraged patient to talk with [facility] office in [place] regarding insurance plans and prescription coverage for next year . Provided with CCM contact information and encouraged to reach out as needed  Patient Self Care Activities related to HTN, DM, depression, osteopenia, anxiety:  . Patient is able to independently perform ADLs and IADLs  Initial goal documentation        Sonya Cook was given information about Chronic Care Management services today including:  1. CCM service includes personalized support from designated clinical staff supervised by her physician, including individualized plan of care and coordination with other care providers 2. 24/7 contact phone numbers for assistance for urgent and routine care needs. 3. Service will only be billed when office clinical staff spend 20 minutes or more in a month to coordinate care. 4.  Only one practitioner may furnish and bill the service in a calendar month. 5. The patient may stop CCM services at any time (effective at the end of the month) by phone call  to the office staff. 6. The patient will be responsible for cost sharing (co-pay) of up to 20% of the service fee (after annual deductible is met).  Patient agreed to services and verbal consent obtained.   The patient verbalized understanding of instructions provided today and declined a print copy of patient instruction materials.   The care management team will reach out to the patient again over the next 60 days.  Follow up with provider re: 09/05/19 Evelina Dun, FNP  Chong Sicilian, BSN, RN-BC [place] [facility] Direct Dial: [phone]

## 2019-08-24 NOTE — Chronic Care Management (AMB) (Addendum)
Chronic Care Management   Initial Visit Note  08/24/2019 Name: Sonya Cook MRN: [id number] DOB: [birth year]  Referred by: Sonya Balloon, FNP Reason for referral : Chronic Care Management (Initial Visit)   ARIANNI Cook is a 74 y.o. year old female who is a primary care patient of Sonya Balloon, FNP. The CCM team was consulted for assistance with chronic disease management and care coordination needs related to HTN, DM, depression, osteopenia, anxiety.  Review of patient status, including review of consultants reports, relevant laboratory and other test results, and collaboration with appropriate care team members and the patient's provider was performed as part of comprehensive patient evaluation and provision of chronic care management services.    Subjective: I spoke with Sonya Cook by telephone regarding management of her chronic medical conditions.   SDOH (Social Determinants of Health) assessments performed: Yes See Care Plan activities for detailed interventions related to SDOH  SDOH Interventions      Most Recent Value  SDOH Interventions  Depression Interventions/Treatment   Currently on Treatment. Offered referral to CCM LCSW but patient declined at this time.         Objective: Outpatient Encounter Medications as of 08/24/2019  Medication Sig Note   atorvastatin (LIPITOR) 40 MG tablet TAKE ONE (1) TABLET EACH DAY    baclofen (LIORESAL) 10 MG tablet Take 10 mg by mouth daily.    Cholecalciferol 2000 units CAPS Take 4,000 Units by mouth 2 (two) times daily.    Coenzyme Q10 (COQ10 PO) Take 1 tablet by mouth daily.    DULoxetine (CYMBALTA) 60 MG capsule TAKE ONE (1) CAPSULE EACH DAY    escitalopram (LEXAPRO) 10 MG tablet Take 1 tablet (10 mg total) by mouth daily.    fentaNYL (DURAGESIC) 50 MCG/HR Place 50 mcg/hr onto the skin every other day.    Homeopathic Products (YEAST-GARD ADV HOMEOPATHIC) CAPS Take 1 capsule by mouth daily.    liraglutide (VICTOZA) 18  MG/3ML SOPN Inject 0.2 mLs (1.2 mg total) into the skin daily. (Needs to be seen before next refill)    lisinopril (ZESTRIL) 10 MG tablet Take 1 tablet (10 mg total) by mouth daily. (Needs to be seen before next refill)    Magnesium 250 MG TABS Take 500 mg by mouth 2 (two) times daily.    Omega-3 Fatty Acids (FISH OIL MAXIMUM STRENGTH) 1200 MG CAPS Take 3,600 mg by mouth 2 (two) times daily.     omeprazole-sodium bicarbonate (ZEGERID) 40-1100 MG capsule Take 1 capsule by mouth 2 (two) times daily.     ondansetron (ZOFRAN) 4 MG tablet Take 1 tablet (4 mg total) by mouth every 8 (eight) hours as needed for nausea or vomiting.    Prasterone, DHEA, 50 MG CAPS Take 1 capsule (50 mg total) by mouth daily.    UNIFINE PENTIPS 31G X 6 MM MISC  08/18/2013: Received from: External Pharmacy   HYDROcodone-acetaminophen (NORCO) 10-325 MG tablet Take 1 tablet by mouth every 6 (six) hours as needed. (Patient not taking: Reported on 08/24/2019)    HYDROcodone-acetaminophen (NORCO) 10-325 MG tablet Take 1 tablet by mouth every 4 (four) hours as needed. (Patient not taking: Reported on 08/24/2019)    NARCAN 4 MG/0.1ML LIQD nasal spray kit     No facility-administered encounter medications on file as of 08/24/2019.    Lab Results  Component Value Date   HGBA1C 6.9 11/25/2018   HGBA1C 7.1 (H) 05/27/2018   HGBA1C 6.6 01/07/2018   Lab Results  Component Value  Date   MICROALBUR 3.7 02/28/2015   LDLCALC 51 11/25/2018   CREATININE 1.07 (H) 11/25/2018   BP Readings from Last 3 Encounters:  11/25/18 121/73  05/27/18 128/66  01/07/18 (!) 150/70     RN Care Manager     "I want to make sure I'm eating the right things" (pt-stated)       [place] (see longitudinal plan of care for additional care plan information)  Current Barriers:  Care Coordination needs related to diet/nutrition in a patient with diabetes (disease states)  Nurse Case Manager Clinical Goal(s):  Over the next 60 days, patient will  work with Consulting civil engineer to address needs related to diet/nutrition Over the next 60 days, patient will talk with dietician/nutritionist regarding a vegetarian diet while managing diabetes  Interventions:  Inter-disciplinary care team collaboration (see longitudinal plan of care) Chart reviewed including recent office notes and lab results Reviewed upcoming appointments: Evelina Dun, FNP 09/05/19 and AWV on 09/12/19 Discussed diet and diabetes management Patient prefers a vegetarian diet but is finding it harder to manage her blood sugar levels since eliminating meat Discussed a dietician/nutrition referral and patient is agreeable Will send request to PCP Patient prefers [facility] location Provided with RN Care Manager contact information and encouraged to reach out as needed  Patient Self Care Activities:  Performs ADL's independently Performs IADL's independently  Initial goal documentation      Chronic Disease Management Needs       CARE PLAN ENTRY (see longtitudinal plan of care for additional care plan information)  Current Barriers:  Chronic Disease Management support, education, and care coordination needs related to HTN, DM, depression, osteopenia, anxiety  Clinical Goal(s) related to HTN, DM, depression, osteopenia, anxiety:  Over the next 60 days, patient will:  Work with the care management team to address educational, disease management, and care coordination needs  Begin or continue self health monitoring activities as directed today Measure and record cbg (blood glucose) 1 to 2 times daily and Measure and record blood pressure 1 times per week Call provider office for new or worsened signs and symptoms Blood glucose findings outside established parameters and Blood pressure findings outside established parameters Call care management team with questions or concerns Verbalize basic understanding of patient centered plan of care established today  Interventions  related to HTN, DM, depression, osteopenia, anxiety:  Evaluation of current treatment plans and patient's adherence to plan as established by provider Assessed patient understanding of disease states Assessed patient's education and care coordination needs Provided disease specific education to patient  Collaborated with appropriate clinical care team members regarding patient needs Encouraged patient to talk with [facility] office in [place] regarding insurance plans and prescription coverage for next year Provided with CCM contact information and encouraged to reach out as needed  Patient Self Care Activities related to HTN, DM, depression, osteopenia, anxiety:  Patient is able to independently perform ADLs and IADLs  Initial goal documentation         Plan:   The care management team will reach out to the patient again over the next 60 days.   Chong Sicilian, BSN, RN-BC Embedded Chronic Care Manager [facility] Direct Dial: [phone]    I have reviewed and agree with the above documentation.   Evelina Dun, FNP

## 2019-08-24 NOTE — Telephone Encounter (Signed)
08/24/2019  I spoke with Ms Bartley today regarding CCM. She has almost eliminated meat from her diet and has noticed that her blood sugar is higher since following a more vegetarian diet. She would like to talk with a dietician/nutritionist about how to manage diabetes while eating a vegetarian diet.   I will forward to PCP for review and dietician/nutrition referral if appropriate. (Patient prefers [place] location)  Chong Sicilian, BSN, RN-BC [place] [facility] Direct Dial: [phone]

## 2019-08-25 NOTE — Telephone Encounter (Signed)
Please make appt with Sonya Cook

## 2019-08-25 NOTE — Telephone Encounter (Signed)
Patient aware and verbalizes understanding.  She will call back to make apt.

## 2019-08-30 DIAGNOSIS — Z79891 Long term (current) use of opiate analgesic: Secondary | ICD-10-CM | POA: Diagnosis not present

## 2019-08-30 DIAGNOSIS — M545 Low back pain: Secondary | ICD-10-CM | POA: Diagnosis not present

## 2019-08-30 DIAGNOSIS — G894 Chronic pain syndrome: Secondary | ICD-10-CM | POA: Diagnosis not present

## 2019-09-01 ENCOUNTER — Other Ambulatory Visit: Payer: Self-pay | Admitting: Family

## 2019-09-01 DIAGNOSIS — F331 Major depressive disorder, recurrent, moderate: Secondary | ICD-10-CM

## 2019-09-01 DIAGNOSIS — F411 Generalized anxiety disorder: Secondary | ICD-10-CM

## 2019-09-05 ENCOUNTER — Other Ambulatory Visit: Payer: Self-pay

## 2019-09-05 ENCOUNTER — Encounter: Payer: Self-pay | Admitting: Family

## 2019-09-05 ENCOUNTER — Telehealth: Payer: Self-pay | Admitting: Family

## 2019-09-05 ENCOUNTER — Ambulatory Visit (INDEPENDENT_AMBULATORY_CARE_PROVIDER_SITE_OTHER): Payer: Medicare Other | Admitting: Family

## 2019-09-05 VITALS — BP 124/73 | HR 65 | Temp 97.6°F | Ht 62.0 in | Wt 146.8 lb

## 2019-09-05 DIAGNOSIS — E663 Overweight: Secondary | ICD-10-CM | POA: Diagnosis not present

## 2019-09-05 DIAGNOSIS — E785 Hyperlipidemia, unspecified: Secondary | ICD-10-CM

## 2019-09-05 DIAGNOSIS — I700 Atherosclerosis of aorta: Secondary | ICD-10-CM

## 2019-09-05 DIAGNOSIS — I10 Essential (primary) hypertension: Secondary | ICD-10-CM

## 2019-09-05 DIAGNOSIS — K219 Gastro-esophageal reflux disease without esophagitis: Secondary | ICD-10-CM

## 2019-09-05 DIAGNOSIS — E1169 Type 2 diabetes mellitus with other specified complication: Secondary | ICD-10-CM

## 2019-09-05 DIAGNOSIS — F331 Major depressive disorder, recurrent, moderate: Secondary | ICD-10-CM | POA: Diagnosis not present

## 2019-09-05 DIAGNOSIS — F411 Generalized anxiety disorder: Secondary | ICD-10-CM | POA: Diagnosis not present

## 2019-09-05 DIAGNOSIS — G8929 Other chronic pain: Secondary | ICD-10-CM | POA: Diagnosis not present

## 2019-09-05 DIAGNOSIS — M545 Low back pain, unspecified: Secondary | ICD-10-CM

## 2019-09-05 LAB — BAYER DCA HB A1C WAIVED: HB A1C (BAYER DCA - WAIVED): 7.3 % — ABNORMAL HIGH (ref <7.0)

## 2019-09-05 MED ORDER — VICTOZA 18 MG/3ML ~~LOC~~ SOPN: 1.2000 mg | PEN_INJECTOR | Freq: Every day | SUBCUTANEOUS | 3 refills | Status: DC

## 2019-09-05 MED ORDER — LISINOPRIL 10 MG PO TABS: 10.0000 mg | ORAL_TABLET | Freq: Every day | ORAL | 1 refills | Status: DC

## 2019-09-05 MED ORDER — BACLOFEN 10 MG PO TABS: 10.0000 mg | ORAL_TABLET | Freq: Every day | ORAL | 3 refills | Status: DC

## 2019-09-05 MED ORDER — DULOXETINE HCL 60 MG PO CPEP: ORAL_CAPSULE | ORAL | 1 refills | Status: DC

## 2019-09-05 MED ORDER — VICTOZA 18 MG/3ML ~~LOC~~ SOPN: 1.2000 mg | PEN_INJECTOR | Freq: Every day | SUBCUTANEOUS | 0 refills | Status: DC

## 2019-09-05 MED ORDER — BUSPIRONE HCL 5 MG PO TABS: 5.0000 mg | ORAL_TABLET | Freq: Three times a day (TID) | ORAL | 3 refills | Status: DC | PRN

## 2019-09-05 MED ORDER — ATORVASTATIN CALCIUM 40 MG PO TABS: ORAL_TABLET | ORAL | 1 refills | Status: DC

## 2019-09-05 NOTE — Progress Notes (Signed)
Subjective:    Patient ID: Sonya Cook, female    DOB: 1946/02/06, 74 y.o.   MRN: [id number]  Chief Complaint  Patient presents with  . Diabetes    check up of chronic medical conditions   PT presents to the office today for chronic follow up.PT is followed by Pain Management for chronic back pain. Diabetes She presents for her follow-up diabetic visit. She has type 2 diabetes mellitus. Her disease course has been stable. There are no hypoglycemic associated symptoms. Associated symptoms include fatigue. Pertinent negatives for diabetes include no blurred vision, no foot paresthesias and no visual change. Symptoms are stable. Diabetic complications include peripheral neuropathy. Pertinent negatives for diabetic complications include no CVA, heart disease or nephropathy. Risk factors for coronary artery disease include dyslipidemia, diabetes mellitus, hypertension, sedentary lifestyle and post-menopausal. She is following a generally healthy diet. Her overall blood glucose range is 140-180 mg/dl. An ACE inhibitor/angiotensin II receptor blocker is being taken. Eye exam is current.  Hypertension This is a chronic problem. The current episode started more than 1 year ago. The problem has been resolved since onset. The problem is controlled. Associated symptoms include shortness of breath ("Some times when I am anxious"). Pertinent negatives include no blurred vision, malaise/fatigue or peripheral edema. Risk factors for coronary artery disease include dyslipidemia and obesity. The current treatment provides moderate improvement. There is no history of kidney disease or CVA.  Gastroesophageal Reflux She complains of belching and heartburn. This is a chronic problem. The current episode started more than 1 year ago. Associated symptoms include fatigue. Risk factors include obesity. She has tried a PPI for the symptoms. The treatment provided moderate relief.  Hyperlipidemia This is a chronic  problem. The current episode started more than 1 year ago. The problem is uncontrolled. Associated symptoms include shortness of breath ("Some times when I am anxious"). Current antihyperlipidemic treatment includes statins. The current treatment provides moderate improvement of lipids. Risk factors for coronary artery disease include dyslipidemia, hypertension and a sedentary lifestyle.  Depression        This is a chronic problem.  The current episode started more than 1 year ago.   The onset quality is gradual.   The problem occurs intermittently.  The problem has been waxing and waning since onset.  Associated symptoms include fatigue, helplessness, hopelessness, irritable, decreased interest and sad.  Past treatments include SNRIs - Serotonin and norepinephrine reuptake inhibitors. Back Pain This is a chronic problem. The current episode started more than 1 year ago. The problem occurs intermittently. The problem has been waxing and waning since onset. The pain is present in the lumbar spine. The quality of the pain is described as aching. The pain is at a severity of 3/10. The pain is moderate. She has tried analgesics for the symptoms. The treatment provided mild relief.      Review of Systems  Constitutional: Positive for fatigue. Negative for malaise/fatigue.  Eyes: Negative for blurred vision.  Respiratory: Positive for shortness of breath ("Some times when I am anxious").   Gastrointestinal: Positive for heartburn.  Musculoskeletal: Positive for back pain.  Psychiatric/Behavioral: Positive for depression.  All other systems reviewed and are negative.      Objective:   Physical Exam Vitals reviewed.  Constitutional:      General: She is irritable. She is not in acute distress.    Appearance: She is well-developed.  HENT:     Head: Normocephalic and atraumatic.     Right Ear: Tympanic  membrane normal.     Left Ear: Tympanic membrane normal.  Eyes:     Pupils: Pupils are equal,  round, and reactive to light.  Neck:     Thyroid: No thyromegaly.  Cardiovascular:     Rate and Rhythm: Normal rate and regular rhythm.     Heart sounds: Normal heart sounds. No murmur.  Pulmonary:     Effort: Pulmonary effort is normal. No respiratory distress.     Breath sounds: Normal breath sounds. No wheezing.  Abdominal:     General: Bowel sounds are normal. There is no distension.     Palpations: Abdomen is soft.     Tenderness: There is no abdominal tenderness.  Musculoskeletal:        General: No tenderness. Normal range of motion.     Cervical back: Normal range of motion and neck supple.  Skin:    General: Skin is warm and dry.  Neurological:     Mental Status: She is alert and oriented to person, place, and time.     Cranial Nerves: No cranial nerve deficit.     Deep Tendon Reflexes: Reflexes are normal and symmetric.  Psychiatric:        Behavior: Behavior normal.        Thought Content: Thought content normal.        Judgment: Judgment normal.     Diabetic Foot Exam - Simple   Simple Foot Form Diabetic Foot exam was performed with the following findings: Yes 09/05/2019 10:33 AM  Visual Inspection No deformities, no ulcerations, no other skin breakdown bilaterally: Yes Sensation Testing Intact to touch and monofilament testing bilaterally: Yes Pulse Check Posterior Tibialis and Dorsalis pulse intact bilaterally: Yes Comments      BP 124/73   Pulse 65   Temp 97.6 F (36.4 C) (Temporal)   Ht 5' 2"  (1.575 m)   Wt 146 lb 12.8 oz (66.6 kg)   SpO2 98%   BMI 26.85 kg/m      Assessment & Plan:  Sonya Cook comes in today with chief complaint of Diabetes (check up of chronic medical conditions)   Diagnosis and orders addressed:  1. Type 2 diabetes mellitus with other specified complication, without long-term current use of insulin (HCC) - Bayer DCA Hb A1c Waived - liraglutide (VICTOZA) 18 MG/3ML SOPN; Inject 0.2 mLs (1.2 mg total) into the skin  daily. (Needs to be seen before next refill)  Dispense: 3 mL; Refill: 0 - CMP14+EGFR - CBC with Differential/Platelet  2. Hypertension, unspecified type - lisinopril (ZESTRIL) 10 MG tablet; Take 1 tablet (10 mg total) by mouth daily.  Dispense: 90 tablet; Refill: 1 - CMP14+EGFR - CBC with Differential/Platelet  3. Gastroesophageal reflux disease, unspecified whether esophagitis present - CMP14+EGFR - CBC with Differential/Platelet  4. Moderate episode of recurrent major depressive disorder (HCC) - DULoxetine (CYMBALTA) 60 MG capsule; TAKE ONE (1) CAPSULE EACH DAY  Dispense: 90 capsule; Refill: 1 - CMP14+EGFR - CBC with Differential/Platelet - busPIRone (BUSPAR) 5 MG tablet; Take 1 tablet (5 mg total) by mouth 3 (three) times daily as needed.  Dispense: 90 tablet; Refill: 3  5. Hyperlipidemia, unspecified hyperlipidemia type - atorvastatin (LIPITOR) 40 MG tablet; TAKE ONE (1) TABLET EACH DAY  Dispense: 90 tablet; Refill: 1 - CMP14+EGFR - CBC with Differential/Platelet  6. Aortic atherosclerosis (HCC) - CMP14+EGFR - CBC with Differential/Platelet  7. Chronic low back pain, unspecified back pain laterality, unspecified whether sciatica present - baclofen (LIORESAL) 10 MG tablet; Take 1 tablet (  10 mg total) by mouth daily.  Dispense: 90 each; Refill: 3 - CMP14+EGFR - CBC with Differential/Platelet  8. Overweight (BMI 25.0-29.9) - CMP14+EGFR - CBC with Differential/Platelet  9. GAD (generalized anxiety disorder) Will add Buspar 5 mg TID prn  - DULoxetine (CYMBALTA) 60 MG capsule; TAKE ONE (1) CAPSULE EACH DAY  Dispense: 90 capsule; Refill: 1 - CMP14+EGFR - CBC with Differential/Platelet - busPIRone (BUSPAR) 5 MG tablet; Take 1 tablet (5 mg total) by mouth 3 (three) times daily as needed.  Dispense: 90 tablet; Refill: 3   Labs pending Health Maintenance reviewed Diet and exercise encouraged  Follow up plan: 6 months    Evelina Dun, FNP

## 2019-09-05 NOTE — Telephone Encounter (Signed)
Prescription sent to pharmacy.

## 2019-09-05 NOTE — Telephone Encounter (Signed)
Aaron Edelman from pharmacy called stating that they received an Rx for Victoza but says the smallest ML there is, is 6ML. Requested that [facility] correct and resend. Says they are not allowed to break box of this Rx because if they do then insurance wont pay.

## 2019-09-05 NOTE — Patient Instructions (Addendum)

## 2019-09-06 ENCOUNTER — Ambulatory Visit (INDEPENDENT_AMBULATORY_CARE_PROVIDER_SITE_OTHER): Payer: Medicare Other | Admitting: Pharmacist

## 2019-09-06 DIAGNOSIS — E1169 Type 2 diabetes mellitus with other specified complication: Secondary | ICD-10-CM | POA: Diagnosis not present

## 2019-09-06 LAB — CMP14+EGFR
ALT: 23 IU/L (ref 0–32)
AST: 30 IU/L (ref 0–40)
Albumin/Globulin Ratio: 2 (ref 1.2–2.2)
Albumin: 4.5 g/dL (ref 3.7–4.7)
Alkaline Phosphatase: 69 IU/L (ref 48–121)
BUN/Creatinine Ratio: 19 (ref 12–28)
BUN: 23 mg/dL (ref 8–27)
Bilirubin Total: 0.4 mg/dL (ref 0.0–1.2)
CO2: 25 mmol/L (ref 20–29)
Calcium: 9.5 mg/dL (ref 8.7–10.3)
Chloride: 101 mmol/L (ref 96–106)
Creatinine, Ser: 1.21 mg/dL — ABNORMAL HIGH (ref 0.57–1.00)
GFR calc Af Amer: 52 mL/min/{1.73_m2} — ABNORMAL LOW (ref >59)
GFR calc non Af Amer: 45 mL/min/{1.73_m2} — ABNORMAL LOW (ref >59)
Globulin, Total: 2.2 g/dL (ref 1.5–4.5)
Glucose: 123 mg/dL — ABNORMAL HIGH (ref 65–99)
Potassium: 4.6 mmol/L (ref 3.5–5.2)
Sodium: 142 mmol/L (ref 134–144)
Total Protein: 6.7 g/dL (ref 6.0–8.5)

## 2019-09-06 LAB — CBC WITH DIFFERENTIAL/PLATELET
Basophils Absolute: 0 10*3/uL (ref 0.0–0.2)
Basos: 1 %
EOS (ABSOLUTE): 0.1 10*3/uL (ref 0.0–0.4)
Eos: 2 %
Hematocrit: 36.2 % (ref 34.0–46.6)
Hemoglobin: 12.2 g/dL (ref 11.1–15.9)
Immature Grans (Abs): 0 10*3/uL (ref 0.0–0.1)
Immature Granulocytes: 0 %
Lymphocytes Absolute: 1.3 10*3/uL (ref 0.7–3.1)
Lymphs: 26 %
MCH: 31.8 pg (ref 26.6–33.0)
MCHC: 33.7 g/dL (ref 31.5–35.7)
MCV: 94 fL (ref 79–97)
Monocytes Absolute: 0.7 10*3/uL (ref 0.1–0.9)
Monocytes: 15 %
Neutrophils Absolute: 2.9 10*3/uL (ref 1.4–7.0)
Neutrophils: 56 %
Platelets: 165 10*3/uL (ref 150–450)
RBC: 3.84 x10E6/uL (ref 3.77–5.28)
RDW: 12.1 % (ref 11.7–15.4)
WBC: 5.1 10*3/uL (ref 3.4–10.8)

## 2019-09-06 NOTE — Progress Notes (Signed)
    09/06/2019 Name: Sonya Cook MRN: [id number] DOB: 27-Oct-1945   S:  69 yoF Presents for diabetes evaluation, education, and management Patient was referred and last seen by Primary Care Provider on 09/05/19.  Insurance coverage/medication affordability: medicare  Patient reports adherence with medications. . Current diabetes medications include: victoza . Current hypertension medications include: lisinopril Goal 130/80 . Current hyperlipidemia medications include: atorvastatin   Patient denies hypoglycemic events.   Patient reported dietary habits: Eats 2-3 meals/day (mostly just 2) -has [employer] [place] bars, coffee for breakfast (35g carbs) -increase water intake -increased to 4k steps per day  Patient denies nocturia (nighttime urination).  Patient denies neuropathy (nerve pain).  Patient denies visual changes.  Patient denies self foot exams.    O:  Lab Results  Component Value Date   HGBA1C 7.3 (H) 09/05/2019   Lipid Panel     Component Value Date/Time   CHOL 143 11/25/2018 0815   CHOL 140 08/30/2012 1252   TRIG 280 (H) 11/25/2018 0815   TRIG 316 (H) 08/18/2013 0922   TRIG 330 (H) 08/30/2012 1252   HDL 36 (L) 11/25/2018 0815   HDL 32 (L) 08/18/2013 0922   HDL 33 (L) 08/30/2012 1252   CHOLHDL 4.0 11/25/2018 0815   LDLCALC 51 11/25/2018 0815   LDLCALC 126 (H) 08/18/2013 0922   LDLCALC 41 08/30/2012 1252     Home fasting blood sugars: 130s  2 hour post-meal/random blood sugars: n/a.   A/P:  Diabetes T2DM, A1C SLIGHTLY INCREASING. Patient is able to verbalize appropriate hypoglycemia management plan. Patient is adherent with medication.   -SWITCHED GLP1 Victoza to Ozempic  --Started GLP-1 Ozempic (generic name: semglutide)  . Instructions: Inject 0.25 mg into the skin weekly for 4 weeks, then increase to 0.5mg  weekly thereafter (as tolerated) . Will apply for patient assistance through [employer] patient assistance  -Extensively discussed  pathophysiology of diabetes, recommended lifestyle interventions, dietary effects on blood sugar control  -Counseled on s/sx of and management of hypoglycemia  Written patient instructions provided.  Total time in face to face counseling 30 minutes.   Follow up Pharmacist TELE-Visit ON 10/04/19.    Regina Eck, PharmD, BCPS Clinical Pharmacist, [place]  [facility] Phone [phone]

## 2019-09-09 ENCOUNTER — Encounter: Payer: Self-pay | Admitting: Pharmacist

## 2019-09-12 ENCOUNTER — Ambulatory Visit (INDEPENDENT_AMBULATORY_CARE_PROVIDER_SITE_OTHER): Payer: Medicare Other | Admitting: *Deleted

## 2019-09-12 DIAGNOSIS — Z0000 Encounter for general adult medical examination without abnormal findings: Secondary | ICD-10-CM

## 2019-09-12 NOTE — Patient Instructions (Signed)
Preventive Care 73 Years and Older, Female Preventive care refers to lifestyle choices and visits with your health care provider that can promote health and wellness. This includes:  A yearly physical exam. This is also called an annual well check.  Regular dental and eye exams.  Immunizations.  Screening for certain conditions.  Healthy lifestyle choices, such as diet and exercise. What can I expect for my preventive care visit? Physical exam Your health care provider will check:  Height and weight. These may be used to calculate body mass index (BMI), which is a measurement that tells if you are at a healthy weight.  Heart rate and blood pressure.  Your skin for abnormal spots. Counseling Your health care provider may ask you questions about:  Alcohol, tobacco, and drug use.  Emotional well-being.  Home and relationship well-being.  Sexual activity.  Eating habits.  History of falls.  Memory and ability to understand (cognition).  Work and work Statistician.  Pregnancy and menstrual history. What immunizations do I need?  Influenza (flu) vaccine  This is recommended every year. Tetanus, diphtheria, and pertussis (Tdap) vaccine  You may need a Td booster every 10 years. Varicella (chickenpox) vaccine  You may need this vaccine if you have not already been vaccinated. Zoster (shingles) vaccine  You may need this after age 73. Pneumococcal conjugate (PCV13) vaccine  One dose is recommended after age 73. Pneumococcal polysaccharide (PPSV23) vaccine  One dose is recommended after age 73. Measles, mumps, and rubella (MMR) vaccine  You may need at least one dose of MMR if you were born in 1957 or later. You may also need a second dose. Meningococcal conjugate (MenACWY) vaccine  You may need this if you have certain conditions. Hepatitis A vaccine  You may need this if you have certain conditions or if you travel or work in places where you may be exposed  to hepatitis A. Hepatitis B vaccine  You may need this if you have certain conditions or if you travel or work in places where you may be exposed to hepatitis B. Haemophilus influenzae type b (Hib) vaccine  You may need this if you have certain conditions. You may receive vaccines as individual doses or as more than one vaccine together in one shot (combination vaccines). Talk with your health care provider about the risks and benefits of combination vaccines. What tests do I need? Blood tests  Lipid and cholesterol levels. These may be checked every 5 years, or more frequently depending on your overall health.  Hepatitis C test.  Hepatitis B test. Screening  Lung cancer screening. You may have this screening every year starting at age 39 if you have a 30-pack-year history of smoking and currently smoke or have quit within the past 15 years.  Colorectal cancer screening. All adults should have this screening starting at age 36 and continuing until age 15. Your health care provider may recommend screening at age 23 if you are at increased risk. You will have tests every 1-10 years, depending on your results and the type of screening test.  Diabetes screening. This is done by checking your blood sugar (glucose) after you have not eaten for a while (fasting). You may have this done every 1-3 years.  Mammogram. This may be done every 1-2 years. Talk with your health care provider about how often you should have regular mammograms.  BRCA-related cancer screening. This may be done if you have a family history of breast, ovarian, tubal, or peritoneal cancers.  Other tests  Sexually transmitted disease (STD) testing.  Bone density scan. This is done to screen for osteoporosis. You may have this done starting at age 44. Follow these instructions at home: Eating and drinking  Eat a diet that includes fresh fruits and vegetables, whole grains, lean protein, and low-fat dairy products. Limit  your intake of foods with high amounts of sugar, saturated fats, and salt.  Take vitamin and mineral supplements as recommended by your health care provider.  Do not drink alcohol if your health care provider tells you not to drink.  If you drink alcohol: ? Limit how much you have to 0-1 drink a day. ? Be aware of how much alcohol is in your drink. In the [place] one drink equals one 12 oz bottle of beer (355 mL), one 5 oz glass of wine (148 mL), or one 1 oz glass of hard liquor (44 mL). Lifestyle  Take daily care of your teeth and gums.  Stay active. Exercise for at least 30 minutes on 5 or more days each week.  Do not use any products that contain nicotine or tobacco, such as cigarettes, e-cigarettes, and chewing tobacco. If you need help quitting, ask your health care provider.  If you are sexually active, practice safe sex. Use a condom or other form of protection in order to prevent STIs (sexually transmitted infections).  Talk with your health care provider about taking a low-dose aspirin or statin. What's next?  Go to your health care provider once a year for a well check visit.  Ask your health care provider how often you should have your eyes and teeth checked.  Stay up to date on all vaccines. This information is not intended to replace advice given to you by your health care provider. Make sure you discuss any questions you have with your health care provider. Document Revised: 03/11/2018 Document Reviewed: 03/11/2018 Elsevier Patient Education  2020 [employer].

## 2019-09-12 NOTE — Progress Notes (Addendum)
MEDICARE ANNUAL WELLNESS VISIT  09/12/2019  Telephone Visit Disclaimer This Medicare AWV was conducted by telephone due to national recommendations for restrictions regarding the COVID-19 Pandemic (e.g. social distancing).  I verified, using two identifiers, that I am speaking with Sonya Cook or their authorized healthcare agent. I discussed the limitations, risks, security, and privacy concerns of performing an evaluation and management service by telephone and the potential availability of an in-person appointment in the future. The patient expressed understanding and agreed to proceed.   Subjective:  Sonya Cook is a 74 y.o. female patient of Hawks, Theador Hawthorne, FNP who had a Medicare Annual Wellness Visit today via telephone. Lynnox is Retired and lives with their spouse. she has 2 children. she reports that she is socially active and does interact with friends/family regularly. she is minimally physically active and enjoys spending time with her great granddaughter and raising chickens.  Patient Care Team: Sharion Balloon, FNP as PCP - [facility], [facility] (Ophthalmology) Ilean China, RN as Registered Nurse Lavera Guise, [facility] (Pharmacist)  Advanced Directives 09/12/2019 09/10/2018 05/26/2017 04/27/2017 11/17/2014  Does Patient Have a Medical Advance Directive? No No No Yes No  Type of Advance Directive - - Public librarian;Living will -  Does patient want to make changes to medical advance directive? - - - No - Patient declined -  Copy of [place] in Chart? - - - No - copy requested -  Would patient like information on creating a medical advance directive? No - Patient declined Yes (MAU/Ambulatory/Procedural Areas - Information given) No - Patient declined - [facility] Utilization Over the Past 12 Months: # of hospitalizations or ER visits: 0 # of surgeries: 0  Review of Systems    Patient reports that  her overall health is better compared to last year.  History obtained from chart review  Patient Reported Readings (BP, Pulse, CBG, Weight, etc) none  Pain Assessment Pain : 0-10 Pain Score: 3  Pain Type: Chronic pain Pain Location: Back Pain Descriptors / Indicators: Aching, Nagging, Dull Pain Onset: Other (comment) (Chronic back pain for the past 16 years) Pain Frequency: Constant Pain Relieving Factors: Fentanyl patch Effect of Pain on Daily Activities: Moderate  Pain Relieving Factors: Fentanyl patch  Current Medications & Allergies (verified) Allergies as of 09/12/2019      Reactions   Codeine    REACTION: hives   Penicillins Hives   Has patient had a PCN reaction causing immediate rash, facial/tongue/throat swelling, SOB or lightheadedness with hypotension: Yes Has patient had a PCN reaction causing severe rash involving mucus membranes or skin necrosis: No Has patient had a PCN reaction that required hospitalization: No Has patient had a PCN reaction occurring within the last 10 years: No If all of the above answers are "NO", then may proceed with Cephalosporin use.      Medication List       Accurate as of September 12, 2019  9:58 AM. If you have any questions, ask your nurse or doctor.        atorvastatin 40 MG tablet Commonly known as: LIPITOR TAKE ONE (1) TABLET EACH DAY   baclofen 10 MG tablet Commonly known as: LIORESAL Take 1 tablet (10 mg total) by mouth daily.   busPIRone 5 MG tablet Commonly known as: BUSPAR Take 1 tablet (5 mg total) by mouth 3 (three) times daily as needed.   COQ10 PO  Take 1 tablet by mouth daily.   DULoxetine 60 MG capsule Commonly known as: CYMBALTA TAKE ONE (1) CAPSULE EACH DAY   fentaNYL 50 MCG/HR Commonly known as: [place] 75 mcg/hr onto the skin every other day.   Fish Oil Maximum Strength 1200 MG Caps Take 3,600 mg by mouth 2 (two) times daily.   lisinopril 10 MG tablet Commonly known as: ZESTRIL Take 1  tablet (10 mg total) by mouth daily.   Narcan 4 MG/0.1ML Liqd nasal spray kit Generic drug: naloxone   omeprazole-sodium bicarbonate 40-1100 MG capsule Commonly known as: ZEGERID Take 1 capsule by mouth 2 (two) times daily.   ondansetron 4 MG tablet Commonly known as: Zofran Take 1 tablet (4 mg total) by mouth every 8 (eight) hours as needed for nausea or vomiting.   Ozempic (0.25 or 0.5 MG/DOSE) 2 MG/1.5ML Sopn Generic drug: Semaglutide(0.25 or 0.5MG/DOS) Inject 0.5 mg into the skin once a week.   Prasterone (DHEA) 50 MG Caps Take 1 capsule (50 mg total) by mouth daily.   Unifine Pentips 31G X 6 MM Misc Generic drug: Insulin Pen Needle       History (reviewed): Past Medical History:  Diagnosis Date  . Anxiety   . Chronic kidney disease   . Depression   . Diabetes mellitus without complication ([place])    type II   . Dyslipidemia   . Family history of adverse reaction to anesthesia    daughter - slow to wake up   . GERD (gastroesophageal reflux disease)   . Hiatal hernia   . Hyperlipidemia   . Hypertension   . Internal hemorrhoids   . Osteopenia   . PONV (postoperative nausea and vomiting)   . Vitamin D deficiency    Past Surgical History:  Procedure Laterality Date  . ABDOMINAL HYSTERECTOMY    . LUMBAR DISC SURGERY    . right hand surgery for cyst     . SHOULDER OPEN ROTATOR CUFF REPAIR Right 04/30/2017   Procedure: Right shoulder mini open rotator cuff repair;  Surgeon: Susa Day, MD;  Location: [facility];  Service: Orthopedics;  Laterality: Right;  Interscalene Block   Family History  Problem Relation Age of Onset  . Hodgkin's lymphoma Mother   . Cancer Father        Lungs  . Colon cancer Neg Hx   . Diabetes Neg Hx   . Stomach cancer Neg Hx   . Rectal cancer Neg Hx    Social History   Socioeconomic History  . Marital status: Married    Spouse name: [name]  . Number of children: 2  . Years of education: Not on file  . Highest education level:  Some college, no degree  Occupational History  . Occupation: Retired  Tobacco Use  . Smoking status: Former Smoker    Types: Cigarettes    Start date: 04/22/2000  . Smokeless tobacco: Never Used  Vaping Use  . Vaping Use: Never used  Substance and Sexual Activity  . Alcohol use: Yes    Comment: occsional use  . Drug use: No  . Sexual activity: Not Currently  Other Topics Concern  . Not on file  Social History Narrative  . Not on file   Social Determinants of Health   Financial Resource Strain: Low Risk   . Difficulty of Paying Living Expenses: Not hard at all  Food Insecurity: No Food Insecurity  . Worried About Charity fundraiser in the Last Year: Never true  .  Ran Out of Food in the Last Year: Never true  Transportation Needs: No Transportation Needs  . Lack of Transportation (Medical): No  . Lack of Transportation (Non-Medical): No  Physical Activity: Sufficiently Active  . Days of Exercise per Week: 7 days  . Minutes of Exercise per Session: 30 min  Stress: Stress Concern Present  . Feeling of Stress : Very much  Social Connections: Socially Integrated  . Frequency of Communication with Friends and Family: More than three times a week  . Frequency of Social Gatherings with Friends and Family: More than three times a week  . Attends Religious Services: More than 4 times per year  . Active Member of Clubs or Organizations: Yes  . Attends Archivist Meetings: More than 4 times per year  . Marital Status: Married    Activities of Daily Living In your present state of health, do you have any difficulty performing the following activities: 09/12/2019 08/24/2019  Hearing? Y N  Comment had hearing test 5 years ago and the left was significantly less than the right but pt is able to hear in conversation-she was told hearing aids wouldn't help some difficulty with right ear but overall hearing is ok  Vision? Y N  Comment dry eyes cataract removal last year    Difficulty concentrating or making decisions? N N  Walking or climbing stairs? N Y  Comment - due to chronic back pain  Dressing or bathing? N N  Doing errands, shopping? N N  Preparing Food and eating ? N N  Using the Toilet? N N  In the past six months, have you accidently leaked urine? N N  Do you have problems with loss of bowel control? N N  Managing your Medications? N N  Managing your Finances? N N  Housekeeping or managing your Housekeeping? N N  Some recent data might be hidden    Patient Education/ Literacy How often do you need to have someone help you when you read instructions, pamphlets, or other written materials from your doctor or pharmacy?: 1 - Never What is the last grade level you completed in school?: 12th grade  Exercise Current Exercise Habits: Home exercise routine, Type of exercise: walking, Time (Minutes): 30, Frequency (Times/Week): 7, Weekly Exercise (Minutes/Week): 210, Intensity: Mild, Exercise limited by: orthopedic condition(s)  Diet Patient reports consuming 3 meals a day and 1 snack(s) a day Patient reports that her primary diet is: Regular Patient reports that she does have regular access to food.   Depression Screen PHQ 2/9 Scores 09/12/2019 09/05/2019 08/24/2019 11/25/2018 09/10/2018 05/27/2018 01/07/2018  PHQ - 2 Score _0 0 0 6 2  PHQ- 9 Score _1 - - 19 9     Fall Risk Fall Risk  09/12/2019 09/05/2019 11/25/2018 09/10/2018 05/27/2018  Falls in the past year? 0 0 0 1 0  Number falls in past yr: - - - 0 -  Risk for fall due to : - - - History of fall(s) -     Objective:  Sonia Side Mkrtchyan seemed alert and oriented and she participated appropriately during our telephone visit.  Blood Pressure Weight BMI  BP Readings from Last 3 Encounters:  09/05/19 124/73  11/25/18 121/73  05/27/18 128/66   Wt Readings from Last 3 Encounters:  09/05/19 146 lb 12.8 oz (66.6 kg)  11/25/18 160 lb 12.8 oz (72.9 kg)  09/10/18 160 lb (72.6 kg)   BMI  Readings from Last 1 Encounters:  09/05/19  26.85 kg/m    *Unable to obtain current vital signs, weight, and BMI due to telephone visit type  Hearing/Vision  . Keatyn did not seem to have difficulty with hearing/understanding during the telephone conversation . Reports that she has had a formal eye exam by an eye care professional within the past year . Reports that she has not had a formal hearing evaluation within the past year *Unable to fully assess hearing and vision during telephone visit type  Cognitive Function: 6CIT Screen 09/12/2019 09/10/2018  What Year? 0 points 0 points  What month? 0 points 0 points  What time? 0 points 0 points  Count back from 20 0 points 0 points  Months in reverse 0 points 0 points  Repeat phrase 2 points 0 points  Total Score 2 0   (Normal:0-7, Significant for Dysfunction: >8)  Normal Cognitive Function Screening: Yes   Immunization & Health Maintenance Record Immunization History  Administered Date(s) Administered  . Influenza Split 03/05/2017  . Influenza, High Dose Seasonal PF 12/18/2015, 01/07/2018, 01/22/2019  . Influenza, Seasonal, Injecte, Preservative Fre 12/22/2013, 02/10/2015  . Influenza,inj,quad, With Preservative 02/28/2017  . Influenza-Unspecified 11/29/2013, 02/10/2015  . Moderna SARS-COVID-2 Vaccination 08/21/2019  . Pneumococcal Conjugate-13 06/17/2016  . Pneumococcal Polysaccharide-23 03/07/2013  . Pneumococcal-Unspecified 08/30/2015  . Tdap 09/05/2018  . Zoster 08/18/2013    Health Maintenance  Topic Date Due  . OPHTHALMOLOGY EXAM  09/06/2019  . MAMMOGRAM  12/18/2026 (Originally 02/29/2012)  . COVID-19 Vaccine (2 - Moderna 2-dose series) 09/18/2019  . INFLUENZA VACCINE  10/30/2019  . COLONOSCOPY  01/25/2020  . HEMOGLOBIN A1C  03/06/2020  . FOOT EXAM  09/04/2020  . TETANUS/TDAP  09/04/2028  . DEXA SCAN  Completed  . Hepatitis C Screening  Completed  . PNA vac Low Risk Adult  Completed       Assessment  This  is a routine wellness examination for [employer]  Health Maintenance: Due or Overdue Health Maintenance Due  Topic Date Due  . OPHTHALMOLOGY EXAM  09/06/2019    Sonya Cook does not need a referral for Community Assistance: Care Management:   Already enrolled with Chong Sicilian, RN Social Work:    no Prescription Assistance:  no Nutrition/Diabetes Education:  no   Plan:  Personalized Goals Goals Addressed            This Visit's Progress   . DIET - INCREASE WATER INTAKE       Try to drink 6-8 glasses of water daily      Personalized Health Maintenance & Screening Recommendations  2nd COVID vaccine  Lung Cancer Screening Recommended: no (Low Dose CT Chest recommended if Age 34-80 years, 30 pack-year currently smoking OR have quit w/in past 15 years) Hepatitis C Screening recommended: no HIV Screening recommended: no  Advanced Directives: Written information was not prepared per patient's request.  Referrals & Orders No orders of the defined types were placed in this encounter.   Follow-up Plan . Follow-up with Sharion Balloon, FNP as planned . Get your 2nd COVID vaccine when it's due   I have personally reviewed and noted the following in the patient's chart:   . Medical and social history . Use of alcohol, tobacco or illicit drugs  . Current medications and supplements . Functional ability and status . Nutritional status . Physical activity . Advanced directives . List of other physicians . Hospitalizations, surgeries, and ER visits in previous 12 months . Vitals . Screenings to include cognitive, depression, and  falls . Referrals and appointments  In addition, I have reviewed and discussed with Sonia Side Guaman certain preventive protocols, quality metrics, and best practice recommendations. A written personalized care plan for preventive services as well as general preventive health recommendations is available and can be mailed to the patient at  her request.      Milas Hock, LPN  9/89/2119     I have reviewed and agree with the above AWV documentation.   Evelina Dun, FNP

## 2019-09-14 ENCOUNTER — Telehealth: Payer: Self-pay | Admitting: Family

## 2019-09-14 NOTE — Telephone Encounter (Signed)
FBGs 200s  Increase to Ozmepic 0.5mg  (Wednesday) Patient assistance papers filed and sent to [employer] PAP

## 2019-09-26 DIAGNOSIS — G8929 Other chronic pain: Secondary | ICD-10-CM | POA: Diagnosis not present

## 2019-09-26 DIAGNOSIS — G894 Chronic pain syndrome: Secondary | ICD-10-CM | POA: Diagnosis not present

## 2019-09-26 DIAGNOSIS — F419 Anxiety disorder, unspecified: Secondary | ICD-10-CM | POA: Diagnosis not present

## 2019-09-26 DIAGNOSIS — M5136 Other intervertebral disc degeneration, lumbar region: Secondary | ICD-10-CM | POA: Diagnosis not present

## 2019-09-26 DIAGNOSIS — E1140 Type 2 diabetes mellitus with diabetic neuropathy, unspecified: Secondary | ICD-10-CM | POA: Diagnosis not present

## 2019-10-04 ENCOUNTER — Ambulatory Visit (INDEPENDENT_AMBULATORY_CARE_PROVIDER_SITE_OTHER): Payer: Medicare Other | Admitting: Pharmacist

## 2019-10-04 ENCOUNTER — Encounter: Payer: Self-pay | Admitting: Pharmacist

## 2019-10-04 VITALS — BP 117/68 | HR 62

## 2019-10-04 DIAGNOSIS — E1169 Type 2 diabetes mellitus with other specified complication: Secondary | ICD-10-CM | POA: Diagnosis not present

## 2019-10-04 NOTE — Progress Notes (Signed)
    10/04/2019 Name: Sonya Cook MRN: [id number] DOB: 04-09-1945   S:  50 yoF presents for diabetes evaluation, education, and management Patient was referred and last seen by Primary Care Provider on 09/05/19.  Insurance coverage/medication affordability: medicare  Patient reports adherence with medications. . Current diabetes medications include: ozempic . Current hypertension medications include: lisinopril Goal 130/80 . Current hyperlipidemia medications include: atorvastatin   Patient denies hypoglycemic events.   Patient reported dietary habits: Eats 2-3 meals/day  Patient reported dietary habits: Eats 2-3 meals/day (mostly just 2) -has [place] bars, coffee for breakfast (35g carbs) -increase water intake -increased to 4k steps per day  Patient denies nocturia (nighttime urination).  Patient denies neuropathy (nerve pain).  Patient denies visual changes.  Patient reports self foot exams.    O:  Lab Results  Component Value Date   HGBA1C 7.3 (H) 09/05/2019    Vitals:   10/04/19 0845  BP: 117/68  Pulse: 62     Lipid Panel     Component Value Date/Time   CHOL 143 11/25/2018 0815   CHOL 140 08/30/2012 1252   TRIG 280 (H) 11/25/2018 0815   TRIG 316 (H) 08/18/2013 0922   TRIG 330 (H) 08/30/2012 1252   HDL 36 (L) 11/25/2018 0815   HDL 32 (L) 08/18/2013 0922   HDL 33 (L) 08/30/2012 1252   CHOLHDL 4.0 11/25/2018 0815   LDLCALC 51 11/25/2018 0815   LDLCALC 126 (H) 08/18/2013 0922   LDLCALC 41 08/30/2012 1252     Home fasting blood sugars: 80-100  2 hour post-meal/random blood sugars: n/a.   A/P:  Diabetes T2DM, A1C SLIGHTLY INCREASING. Patient is able to verbalize appropriate hypoglycemia management plan. Patient is adherent with medication.   -Continued GLP-1 Ozempic  (generic name semglutide) 0.5mg  sq weekly   Sample given --> UEK#[id number], EXP 10/23  She is really enjoying once weekly vs daily Victoza  Patient approved via [employer] Patient Assistance until 03/30/20, last refill is due 02/28/20.  Shipping to PCP office in 10-14 business days  -COVID vaccine #2 given on 09/19/19  -Extensively discussed pathophysiology of diabetes, recommended lifestyle interventions, dietary effects on blood sugar control  -Counseled on s/sx of and management of hypoglycemia  -Next A1C anticipated 6-12 months   Written patient instructions provided.  Total time in face to face counseling 28 minutes.   Follow up PCP Clinic Visit in 3-62months.   Regina Eck, PharmD, BCPS Clinical Pharmacist, [place]  II Phone [phone]

## 2019-10-18 ENCOUNTER — Telehealth: Payer: Self-pay | Admitting: Pharmacist

## 2019-10-18 NOTE — Telephone Encounter (Signed)
Patient approved for [employer] patient assistance (Ozempic) Letter received on 10/05/19 Ozempic shipment to arrive at PCP office in 10-14 business days

## 2019-10-24 DIAGNOSIS — M5136 Other intervertebral disc degeneration, lumbar region: Secondary | ICD-10-CM | POA: Diagnosis not present

## 2019-10-24 DIAGNOSIS — M545 Low back pain: Secondary | ICD-10-CM | POA: Diagnosis not present

## 2019-10-24 DIAGNOSIS — G894 Chronic pain syndrome: Secondary | ICD-10-CM | POA: Diagnosis not present

## 2019-10-24 DIAGNOSIS — Z79891 Long term (current) use of opiate analgesic: Secondary | ICD-10-CM | POA: Diagnosis not present

## 2019-10-28 ENCOUNTER — Telehealth: Payer: Self-pay | Admitting: Pharmacist

## 2019-10-28 NOTE — Telephone Encounter (Signed)
Patient's supply of Ozempic from [employer] patient assistance program has arrived to office Placed in fridge for patient to pick up Patient made aware  FBG 80-120s Denies hypoglycemia Encouraged patient to call if needs arise

## 2019-10-30 DIAGNOSIS — G894 Chronic pain syndrome: Secondary | ICD-10-CM | POA: Diagnosis not present

## 2019-11-03 ENCOUNTER — Other Ambulatory Visit: Payer: Self-pay

## 2019-11-03 DIAGNOSIS — Z20822 Contact with and (suspected) exposure to covid-19: Secondary | ICD-10-CM | POA: Diagnosis not present

## 2019-11-04 LAB — NOVEL CORONAVIRUS, NAA: SARS-CoV-2, NAA: NOT DETECTED

## 2019-11-04 LAB — SARS-COV-2, NAA 2 DAY TAT

## 2019-11-28 DIAGNOSIS — G894 Chronic pain syndrome: Secondary | ICD-10-CM | POA: Diagnosis not present

## 2019-11-30 DIAGNOSIS — G894 Chronic pain syndrome: Secondary | ICD-10-CM | POA: Diagnosis not present

## 2019-12-26 DIAGNOSIS — M25559 Pain in unspecified hip: Secondary | ICD-10-CM | POA: Diagnosis not present

## 2019-12-26 DIAGNOSIS — M47897 Other spondylosis, lumbosacral region: Secondary | ICD-10-CM | POA: Diagnosis not present

## 2019-12-26 DIAGNOSIS — M48062 Spinal stenosis, lumbar region with neurogenic claudication: Secondary | ICD-10-CM | POA: Diagnosis not present

## 2019-12-26 DIAGNOSIS — M5136 Other intervertebral disc degeneration, lumbar region: Secondary | ICD-10-CM | POA: Diagnosis not present

## 2019-12-26 DIAGNOSIS — R5382 Chronic fatigue, unspecified: Secondary | ICD-10-CM | POA: Diagnosis not present

## 2019-12-26 DIAGNOSIS — M25519 Pain in unspecified shoulder: Secondary | ICD-10-CM | POA: Diagnosis not present

## 2019-12-26 DIAGNOSIS — M545 Low back pain: Secondary | ICD-10-CM | POA: Diagnosis not present

## 2019-12-26 DIAGNOSIS — G4700 Insomnia, unspecified: Secondary | ICD-10-CM | POA: Diagnosis not present

## 2019-12-26 DIAGNOSIS — E1140 Type 2 diabetes mellitus with diabetic neuropathy, unspecified: Secondary | ICD-10-CM | POA: Diagnosis not present

## 2019-12-26 DIAGNOSIS — M79609 Pain in unspecified limb: Secondary | ICD-10-CM | POA: Diagnosis not present

## 2019-12-26 DIAGNOSIS — Z79891 Long term (current) use of opiate analgesic: Secondary | ICD-10-CM | POA: Diagnosis not present

## 2019-12-26 DIAGNOSIS — R519 Headache, unspecified: Secondary | ICD-10-CM | POA: Diagnosis not present

## 2019-12-26 DIAGNOSIS — F419 Anxiety disorder, unspecified: Secondary | ICD-10-CM | POA: Diagnosis not present

## 2019-12-26 DIAGNOSIS — G894 Chronic pain syndrome: Secondary | ICD-10-CM | POA: Diagnosis not present

## 2020-01-23 DIAGNOSIS — Z79891 Long term (current) use of opiate analgesic: Secondary | ICD-10-CM | POA: Diagnosis not present

## 2020-01-23 DIAGNOSIS — M5416 Radiculopathy, lumbar region: Secondary | ICD-10-CM | POA: Diagnosis not present

## 2020-01-23 DIAGNOSIS — G894 Chronic pain syndrome: Secondary | ICD-10-CM | POA: Diagnosis not present

## 2020-01-23 DIAGNOSIS — M5136 Other intervertebral disc degeneration, lumbar region: Secondary | ICD-10-CM | POA: Diagnosis not present

## 2020-01-30 DIAGNOSIS — G894 Chronic pain syndrome: Secondary | ICD-10-CM | POA: Diagnosis not present

## 2020-02-20 ENCOUNTER — Other Ambulatory Visit: Payer: Self-pay | Admitting: Family

## 2020-02-20 DIAGNOSIS — Z79891 Long term (current) use of opiate analgesic: Secondary | ICD-10-CM | POA: Diagnosis not present

## 2020-02-20 DIAGNOSIS — M5136 Other intervertebral disc degeneration, lumbar region: Secondary | ICD-10-CM | POA: Diagnosis not present

## 2020-02-20 DIAGNOSIS — M5416 Radiculopathy, lumbar region: Secondary | ICD-10-CM | POA: Diagnosis not present

## 2020-02-20 DIAGNOSIS — G894 Chronic pain syndrome: Secondary | ICD-10-CM | POA: Diagnosis not present

## 2020-02-29 DIAGNOSIS — G894 Chronic pain syndrome: Secondary | ICD-10-CM | POA: Diagnosis not present

## 2020-03-08 ENCOUNTER — Telehealth: Payer: Self-pay | Admitting: Family

## 2020-03-08 NOTE — Telephone Encounter (Signed)
Pt is using last ozempic and would like to get more if there is any available - has enough for two weeks

## 2020-03-08 NOTE — Telephone Encounter (Signed)
Lmtcb.

## 2020-03-08 NOTE — Telephone Encounter (Signed)
We do not have Ozempic samples at this time Have her schedule appt with me to re-enroll for patient assistance for 2022--we should have samples in the next week or so

## 2020-03-14 ENCOUNTER — Telehealth: Payer: Self-pay | Admitting: Pharmacist

## 2020-03-14 NOTE — Telephone Encounter (Signed)
appt scheduled with pharmD for 04/02/20 (Diabetes f/u and PAP 2022)  Patient requesting sample of Ozempic

## 2020-03-19 ENCOUNTER — Other Ambulatory Visit: Payer: Self-pay | Admitting: Family

## 2020-03-19 DIAGNOSIS — E785 Hyperlipidemia, unspecified: Secondary | ICD-10-CM

## 2020-03-19 DIAGNOSIS — G894 Chronic pain syndrome: Secondary | ICD-10-CM | POA: Diagnosis not present

## 2020-03-19 DIAGNOSIS — Z79891 Long term (current) use of opiate analgesic: Secondary | ICD-10-CM | POA: Diagnosis not present

## 2020-03-19 DIAGNOSIS — M5136 Other intervertebral disc degeneration, lumbar region: Secondary | ICD-10-CM | POA: Diagnosis not present

## 2020-03-19 DIAGNOSIS — M5416 Radiculopathy, lumbar region: Secondary | ICD-10-CM | POA: Diagnosis not present

## 2020-03-31 DIAGNOSIS — G894 Chronic pain syndrome: Secondary | ICD-10-CM | POA: Diagnosis not present

## 2020-04-02 ENCOUNTER — Ambulatory Visit: Payer: Self-pay | Admitting: Pharmacist

## 2020-04-06 ENCOUNTER — Ambulatory Visit: Payer: Medicare Other | Admitting: Pharmacist

## 2020-04-10 ENCOUNTER — Other Ambulatory Visit: Payer: Self-pay

## 2020-04-10 ENCOUNTER — Ambulatory Visit (INDEPENDENT_AMBULATORY_CARE_PROVIDER_SITE_OTHER): Payer: Medicare Other | Admitting: Pharmacist

## 2020-04-10 DIAGNOSIS — E1169 Type 2 diabetes mellitus with other specified complication: Secondary | ICD-10-CM

## 2020-04-10 LAB — BAYER DCA HB A1C WAIVED: HB A1C (BAYER DCA - WAIVED): 6 % (ref <7.0)

## 2020-04-10 NOTE — Progress Notes (Signed)
    04/10/2020 Name: Sonya Cook MRN: [id number] DOB: 08-Jun-1945   S:  51yoF Presents for diabetes evaluation, education, and management Patient was referred and last seen by Primary Care Provider on 08/2019.  Insurance coverage/medication affordability: medicare  Patient reports adherence with medications. . Current diabetes medications include: ozempic . Current hypertension medications include: lisinopril Goal 130/80 . Current hyperlipidemia medications include: atorvastatin   Patient denies hypoglycemic events.   Patient-reported exercise habits: n/a O:  Lab Results  Component Value Date   HGBA1C 7.3 (H) 09/05/2019    Lipid Panel     Component Value Date/Time   CHOL 143 11/25/2018 0815   CHOL 140 08/30/2012 1252   TRIG 280 (H) 11/25/2018 0815   TRIG 316 (H) 08/18/2013 0922   TRIG 330 (H) 08/30/2012 1252   HDL 36 (L) 11/25/2018 0815   HDL 32 (L) 08/18/2013 0922   HDL 33 (L) 08/30/2012 1252   CHOLHDL 4.0 11/25/2018 0815   LDLCALC 51 11/25/2018 0815   LDLCALC 126 (H) 08/18/2013 0922   LDLCALC 41 08/30/2012 1252     Home fasting blood sugars: <150  2 hour post-meal/random blood sugars: <170.    Clinical Atherosclerotic Cardiovascular Disease (ASCVD): No   The 10-year ASCVD risk score Mikey Bussing DC Jr., et al., 2013) is: 25.6%   Values used to calculate the score:     Age: 24 years     Sex: Female     Is Non-Hispanic African American: No     Diabetic: Yes     Tobacco smoker: No     Systolic Blood Pressure: 287 mmHg     Is BP treated: Yes     HDL Cholesterol: 36 mg/dL     Total Cholesterol: 143 mg/dL    A/P:  Diabetes T2DM currently controlled.  A1c today is 6.0% (down from 7.3%).  Patient is adherent with medication.  Patient is now controlled on Ozempic 0.25mg  sq weekly.  Will continue current dosing.  Patient does not warrant increase.  -Ozempic dropped to 0.25mg  sq weekly dropped to 0.25mg  due to GI side effects.  Will continue Ozempic 0.25mg  sq  weekly as patient is controlled on current dose.  Patient states GI has gotten better with dosing of Ozempic every 7-10days.  BG remains controlled.  -Re-enrolled patient in [employer] patient assistance program for 2022.  35-month supply of Ozempic will ship to PCP office in 10-14 business days  -Extensively discussed pathophysiology of diabetes, recommended lifestyle interventions, dietary effects on blood sugar control  -Counseled on s/sx of and management of hypoglycemia  -Next A1C anticipated 6 months   Written patient instructions provided.  Total time in face to face counseling 25 minutes.   Follow up PCP Clinic Visit in 6 months   Regina Eck, PharmD, BCPS Clinical Pharmacist, [place]  II Phone [phone]

## 2020-04-18 ENCOUNTER — Telehealth: Payer: Self-pay | Admitting: Pharmacist

## 2020-04-18 NOTE — Telephone Encounter (Signed)
Continue patient on Ozempic 0.25mg  sq every 7-10days

## 2020-04-24 DIAGNOSIS — M25519 Pain in unspecified shoulder: Secondary | ICD-10-CM | POA: Diagnosis not present

## 2020-04-24 DIAGNOSIS — Z79891 Long term (current) use of opiate analgesic: Secondary | ICD-10-CM | POA: Diagnosis not present

## 2020-04-24 DIAGNOSIS — R519 Headache, unspecified: Secondary | ICD-10-CM | POA: Diagnosis not present

## 2020-04-24 DIAGNOSIS — M4726 Other spondylosis with radiculopathy, lumbar region: Secondary | ICD-10-CM | POA: Diagnosis not present

## 2020-04-24 DIAGNOSIS — M5136 Other intervertebral disc degeneration, lumbar region: Secondary | ICD-10-CM | POA: Diagnosis not present

## 2020-04-24 DIAGNOSIS — M25559 Pain in unspecified hip: Secondary | ICD-10-CM | POA: Diagnosis not present

## 2020-04-24 DIAGNOSIS — M792 Neuralgia and neuritis, unspecified: Secondary | ICD-10-CM | POA: Diagnosis not present

## 2020-04-24 DIAGNOSIS — G894 Chronic pain syndrome: Secondary | ICD-10-CM | POA: Diagnosis not present

## 2020-04-24 DIAGNOSIS — M79609 Pain in unspecified limb: Secondary | ICD-10-CM | POA: Diagnosis not present

## 2020-04-24 DIAGNOSIS — M48062 Spinal stenosis, lumbar region with neurogenic claudication: Secondary | ICD-10-CM | POA: Diagnosis not present

## 2020-04-24 DIAGNOSIS — E1140 Type 2 diabetes mellitus with diabetic neuropathy, unspecified: Secondary | ICD-10-CM | POA: Diagnosis not present

## 2020-04-24 DIAGNOSIS — M47897 Other spondylosis, lumbosacral region: Secondary | ICD-10-CM | POA: Diagnosis not present

## 2020-04-24 DIAGNOSIS — M4807 Spinal stenosis, lumbosacral region: Secondary | ICD-10-CM | POA: Diagnosis not present

## 2020-04-24 DIAGNOSIS — R5382 Chronic fatigue, unspecified: Secondary | ICD-10-CM | POA: Diagnosis not present

## 2020-05-01 DIAGNOSIS — G894 Chronic pain syndrome: Secondary | ICD-10-CM | POA: Diagnosis not present

## 2020-05-08 ENCOUNTER — Encounter: Payer: Self-pay | Admitting: Internal Medicine

## 2020-05-09 ENCOUNTER — Telehealth: Payer: Self-pay | Admitting: *Deleted

## 2020-05-09 NOTE — Telephone Encounter (Signed)
#  [place] came in - pt assistance  Received - pt aware to pick up   will place in fridge with her name on it.

## 2020-05-22 ENCOUNTER — Telehealth: Payer: Self-pay

## 2020-05-22 NOTE — Telephone Encounter (Signed)
noted 

## 2020-05-30 ENCOUNTER — Other Ambulatory Visit: Payer: Self-pay | Admitting: Family

## 2020-05-30 DIAGNOSIS — E785 Hyperlipidemia, unspecified: Secondary | ICD-10-CM

## 2020-05-30 DIAGNOSIS — F411 Generalized anxiety disorder: Secondary | ICD-10-CM

## 2020-05-30 DIAGNOSIS — F331 Major depressive disorder, recurrent, moderate: Secondary | ICD-10-CM

## 2020-05-30 DIAGNOSIS — G894 Chronic pain syndrome: Secondary | ICD-10-CM | POA: Diagnosis not present

## 2020-05-31 ENCOUNTER — Ambulatory Visit (INDEPENDENT_AMBULATORY_CARE_PROVIDER_SITE_OTHER): Payer: Medicare Other | Admitting: Family

## 2020-05-31 ENCOUNTER — Encounter: Payer: Self-pay | Admitting: Family

## 2020-05-31 ENCOUNTER — Other Ambulatory Visit: Payer: Self-pay

## 2020-05-31 VITALS — BP 132/80 | HR 74 | Temp 97.8°F | Ht 62.0 in | Wt 152.2 lb

## 2020-05-31 DIAGNOSIS — I10 Essential (primary) hypertension: Secondary | ICD-10-CM | POA: Diagnosis not present

## 2020-05-31 DIAGNOSIS — E1169 Type 2 diabetes mellitus with other specified complication: Secondary | ICD-10-CM | POA: Diagnosis not present

## 2020-05-31 DIAGNOSIS — I700 Atherosclerosis of aorta: Secondary | ICD-10-CM

## 2020-05-31 DIAGNOSIS — F331 Major depressive disorder, recurrent, moderate: Secondary | ICD-10-CM

## 2020-05-31 DIAGNOSIS — Z1211 Encounter for screening for malignant neoplasm of colon: Secondary | ICD-10-CM | POA: Diagnosis not present

## 2020-05-31 DIAGNOSIS — K219 Gastro-esophageal reflux disease without esophagitis: Secondary | ICD-10-CM | POA: Diagnosis not present

## 2020-05-31 DIAGNOSIS — G894 Chronic pain syndrome: Secondary | ICD-10-CM | POA: Diagnosis not present

## 2020-05-31 LAB — BAYER DCA HB A1C WAIVED: HB A1C (BAYER DCA - WAIVED): 6.6 % (ref <7.0)

## 2020-05-31 NOTE — Patient Instructions (Signed)
Health Maintenance After Age 74 After age 74, you are at a higher risk for certain long-term diseases and infections as well as injuries from falls. Falls are a major cause of broken bones and head injuries in people who are older than age 74. Getting regular preventive care can help to keep you healthy and well. Preventive care includes getting regular testing and making lifestyle changes as recommended by your health care provider. Talk with your health care provider about:  Which screenings and tests you should have. A screening is a test that checks for a disease when you have no symptoms.  A diet and exercise plan that is right for you. What should I know about screenings and tests to prevent falls? Screening and testing are the best ways to find a health problem early. Early diagnosis and treatment give you the best chance of managing medical conditions that are common after age 74. Certain conditions and lifestyle choices may make you more likely to have a fall. Your health care provider may recommend:  Regular vision checks. Poor vision and conditions such as cataracts can make you more likely to have a fall. If you wear glasses, make sure to get your prescription updated if your vision changes.  Medicine review. Work with your health care provider to regularly review all of the medicines you are taking, including over-the-counter medicines. Ask your health care provider about any side effects that may make you more likely to have a fall. Tell your health care provider if any medicines that you take make you feel dizzy or sleepy.  Osteoporosis screening. Osteoporosis is a condition that causes the bones to get weaker. This can make the bones weak and cause them to break more easily.  Blood pressure screening. Blood pressure changes and medicines to control blood pressure can make you feel dizzy.  Strength and balance checks. Your health care provider may recommend certain tests to check your  strength and balance while standing, walking, or changing positions.  Foot health exam. Foot pain and numbness, as well as not wearing proper footwear, can make you more likely to have a fall.  Depression screening. You may be more likely to have a fall if you have a fear of falling, feel emotionally low, or feel unable to do activities that you used to do.  Alcohol use screening. Using too much alcohol can affect your balance and may make you more likely to have a fall. What actions can I take to lower my risk of falls? General instructions  Talk with your health care provider about your risks for falling. Tell your health care provider if: ? You fall. Be sure to tell your health care provider about all falls, even ones that seem minor. ? You feel dizzy, sleepy, or off-balance.  Take over-the-counter and prescription medicines only as told by your health care provider. These include any supplements.  Eat a healthy diet and maintain a healthy weight. A healthy diet includes low-fat dairy products, low-fat (lean) meats, and fiber from whole grains, beans, and lots of fruits and vegetables. Home safety  Remove any tripping hazards, such as rugs, cords, and clutter.  Install safety equipment such as grab bars in bathrooms and safety rails on stairs.  Keep rooms and walkways well-lit. Activity  Follow a regular exercise program to stay fit. This will help you maintain your balance. Ask your health care provider what types of exercise are appropriate for you.  If you need a cane or walker,   use it as recommended by your health care provider.  Wear supportive shoes that have nonskid soles.   Lifestyle  Do not drink alcohol if your health care provider tells you not to drink.  If you drink alcohol, limit how much you have: ? 0-1 drink a day for women. ? 0-2 drinks a day for men.  Be aware of how much alcohol is in your drink. In the [place] one drink equals one typical bottle of beer (12  oz), one-half glass of wine (5 oz), or one shot of hard liquor (1 oz).  Do not use any products that contain nicotine or tobacco, such as cigarettes and e-cigarettes. If you need help quitting, ask your health care provider. Summary  Having a healthy lifestyle and getting preventive care can help to protect your health and wellness after age 74.  Screening and testing are the best way to find a health problem early and help you avoid having a fall. Early diagnosis and treatment give you the best chance for managing medical conditions that are more common for people who are older than age 74.  Falls are a major cause of broken bones and head injuries in people who are older than age 74. Take precautions to prevent a fall at home.  Work with your health care provider to learn what changes you can make to improve your health and wellness and to prevent falls. This information is not intended to replace advice given to you by your health care provider. Make sure you discuss any questions you have with your health care provider. Document Revised: 07/08/2018 Document Reviewed: 01/28/2017 Elsevier Patient Education  2021 Elsevier Inc.  

## 2020-05-31 NOTE — Progress Notes (Signed)
Subjective:    Patient ID: Sonya Cook, female    DOB: 02/11/1946, 75 y.o.   MRN: [id number]  Chief Complaint  Patient presents with  . Gastroesophageal Reflux  . Diabetes   PT presents to the office today for chronic follow up.PT is followed by Pain Management for chronic back pain.She states her pain management provider needs a letter stating she is medically stable.  Gastroesophageal Reflux She complains of belching and heartburn. This is a chronic problem. The current episode started more than 1 year ago. The problem occurs occasionally. The problem has been waxing and waning. The symptoms are aggravated by certain foods. Risk factors include obesity. She has tried a PPI for the symptoms. The treatment provided moderate relief.  Diabetes She presents for her follow-up diabetic visit. She has type 2 diabetes mellitus. Her disease course has been stable. There are no hypoglycemic associated symptoms. Pertinent negatives for diabetes include no blurred vision and no foot paresthesias. There are no hypoglycemic complications. Symptoms are stable. Pertinent negatives for diabetic complications include no CVA, heart disease, nephropathy or peripheral neuropathy. Risk factors for coronary artery disease include dyslipidemia, diabetes mellitus, hypertension and sedentary lifestyle. She is following a generally healthy diet. Her overall blood glucose range is 90-110 mg/dl. An ACE inhibitor/angiotensin II receptor blocker is being taken. Eye exam is current.  Hypertension This is a chronic problem. The current episode started more than 1 year ago. The problem has been resolved since onset. The problem is controlled. Associated symptoms include malaise/fatigue. Pertinent negatives include no blurred vision, peripheral edema or shortness of breath. Risk factors for coronary artery disease include dyslipidemia. The current treatment provides moderate improvement. There is no history of CVA or heart  failure.  Arthritis Presents for follow-up visit. She complains of pain and stiffness. The symptoms have been stable. Her pain is at a severity of 2/10.  Hyperlipidemia This is a chronic problem. The current episode started more than 1 year ago. Exacerbating diseases include obesity. Pertinent negatives include no shortness of breath. Current antihyperlipidemic treatment includes statins. The current treatment provides moderate improvement of lipids. Risk factors for coronary artery disease include dyslipidemia, diabetes mellitus, hypertension and a sedentary lifestyle.  Depression        This is a chronic problem.  The current episode started more than 1 year ago.   The onset quality is gradual.   The problem occurs intermittently.  Associated symptoms include irritable and restlessness.  Associated symptoms include no helplessness and no hopelessness. Back Pain This is a chronic problem. The current episode started more than 1 year ago. The problem occurs intermittently. The pain is present in the lumbar spine. The quality of the pain is described as aching. The pain is at a severity of 2/10. The pain is mild. She has tried analgesics for the symptoms. The treatment provided mild relief.      Review of Systems  Constitutional: Positive for malaise/fatigue.  Eyes: Negative for blurred vision.  Respiratory: Negative for shortness of breath.   Gastrointestinal: Positive for heartburn.  Musculoskeletal: Positive for arthritis, back pain and stiffness.  Psychiatric/Behavioral: Positive for depression.  All other systems reviewed and are negative.      Objective:   Physical Exam Vitals reviewed.  Constitutional:      General: She is irritable. She is not in acute distress.    Appearance: She is well-developed and well-nourished.  HENT:     Head: Normocephalic and atraumatic.     Right Ear:  Tympanic membrane normal.     Left Ear: Tympanic membrane normal.     Mouth/Throat:     Mouth:  Oropharynx is clear and moist.  Eyes:     Pupils: Pupils are equal, round, and reactive to light.  Neck:     Thyroid: No thyromegaly.  Cardiovascular:     Rate and Rhythm: Normal rate and regular rhythm.     Pulses: Intact distal pulses.     Heart sounds: Normal heart sounds. No murmur heard.   Pulmonary:     Effort: Pulmonary effort is normal. No respiratory distress.     Breath sounds: Normal breath sounds. No wheezing.  Abdominal:     General: Bowel sounds are normal. There is no distension.     Palpations: Abdomen is soft.     Tenderness: There is no abdominal tenderness.  Musculoskeletal:        General: Tenderness present. No edema.     Cervical back: Normal range of motion and neck supple.     Comments: Pain in lumbar with flexion  Skin:    General: Skin is warm and dry.  Neurological:     Mental Status: She is alert and oriented to person, place, and time.     Cranial Nerves: No cranial nerve deficit.     Deep Tendon Reflexes: Reflexes are normal and symmetric.  Psychiatric:        Mood and Affect: Mood and affect normal.        Behavior: Behavior normal.        Thought Content: Thought content normal.        Judgment: Judgment normal.     BP 132/80   Pulse 74   Temp 97.8 F (36.6 C) (Temporal)   Ht _0  (1.575 m)   Wt 152 lb 3.2 oz (69 kg)   BMI 27.84 kg/m      Assessment & Plan:  Sonya Cook comes in today with chief complaint of Gastroesophageal Reflux and Diabetes   Diagnosis and orders addressed:  1. Aortic atherosclerosis (HCC) - CMP14+EGFR - CBC with Differential/Platelet  2. Hypertension, unspecified type - CMP14+EGFR - CBC with Differential/Platelet  3. Gastroesophageal reflux disease, unspecified whether esophagitis present - CMP14+EGFR - CBC with Differential/Platelet  4. Type 2 diabetes mellitus with other specified complication, without long-term current use of insulin (HCC) - CMP14+EGFR - CBC with Differential/Platelet -  Bayer DCA Hb A1c Waived  5. Moderate episode of recurrent major depressive disorder (HCC) - CMP14+EGFR - CBC with Differential/Platelet  6. Colon cancer screening - Cologuard - CMP14+EGFR - CBC with Differential/Platelet   Labs pending Health Maintenance reviewed Diet and exercise encouraged  Follow up plan: 6 months    Evelina Dun, FNP

## 2020-06-01 LAB — CMP14+EGFR
ALT: 27 IU/L (ref 0–32)
AST: 31 IU/L (ref 0–40)
Albumin/Globulin Ratio: 1.7 (ref 1.2–2.2)
Albumin: 4.3 g/dL (ref 3.7–4.7)
Alkaline Phosphatase: 72 IU/L (ref 44–121)
BUN/Creatinine Ratio: 18 (ref 12–28)
BUN: 17 mg/dL (ref 8–27)
Bilirubin Total: 0.4 mg/dL (ref 0.0–1.2)
CO2: 25 mmol/L (ref 20–29)
Calcium: 9.9 mg/dL (ref 8.7–10.3)
Chloride: 102 mmol/L (ref 96–106)
Creatinine, Ser: 0.93 mg/dL (ref 0.57–1.00)
Globulin, Total: 2.6 g/dL (ref 1.5–4.5)
Glucose: 98 mg/dL (ref 65–99)
Potassium: 5.3 mmol/L — ABNORMAL HIGH (ref 3.5–5.2)
Sodium: 143 mmol/L (ref 134–144)
Total Protein: 6.9 g/dL (ref 6.0–8.5)
eGFR: 65 mL/min/{1.73_m2} (ref >59)

## 2020-06-01 LAB — CBC WITH DIFFERENTIAL/PLATELET
Basophils Absolute: 0.1 10*3/uL (ref 0.0–0.2)
Basos: 1 %
EOS (ABSOLUTE): 0.2 10*3/uL (ref 0.0–0.4)
Eos: 3 %
Hematocrit: 39.4 % (ref 34.0–46.6)
Hemoglobin: 13.7 g/dL (ref 11.1–15.9)
Immature Grans (Abs): 0 10*3/uL (ref 0.0–0.1)
Immature Granulocytes: 0 %
Lymphocytes Absolute: 2.3 10*3/uL (ref 0.7–3.1)
Lymphs: 29 %
MCH: 32 pg (ref 26.6–33.0)
MCHC: 34.8 g/dL (ref 31.5–35.7)
MCV: 92 fL (ref 79–97)
Monocytes Absolute: 0.8 10*3/uL (ref 0.1–0.9)
Monocytes: 9 %
Neutrophils Absolute: 4.7 10*3/uL (ref 1.4–7.0)
Neutrophils: 58 %
Platelets: 199 10*3/uL (ref 150–450)
RBC: 4.28 x10E6/uL (ref 3.77–5.28)
RDW: 12.9 % (ref 11.7–15.4)
WBC: 8.1 10*3/uL (ref 3.4–10.8)

## 2020-06-26 ENCOUNTER — Other Ambulatory Visit: Payer: Self-pay | Admitting: Family

## 2020-06-26 DIAGNOSIS — F411 Generalized anxiety disorder: Secondary | ICD-10-CM

## 2020-06-26 DIAGNOSIS — E785 Hyperlipidemia, unspecified: Secondary | ICD-10-CM

## 2020-06-26 DIAGNOSIS — F331 Major depressive disorder, recurrent, moderate: Secondary | ICD-10-CM

## 2020-06-27 DIAGNOSIS — G894 Chronic pain syndrome: Secondary | ICD-10-CM | POA: Diagnosis not present

## 2020-07-26 ENCOUNTER — Other Ambulatory Visit: Payer: Self-pay | Admitting: Family Medicine

## 2020-07-31 ENCOUNTER — Encounter: Payer: Self-pay | Admitting: Family Medicine

## 2020-08-01 DIAGNOSIS — G4700 Insomnia, unspecified: Secondary | ICD-10-CM | POA: Diagnosis not present

## 2020-08-01 DIAGNOSIS — M25519 Pain in unspecified shoulder: Secondary | ICD-10-CM | POA: Diagnosis not present

## 2020-08-01 DIAGNOSIS — R5382 Chronic fatigue, unspecified: Secondary | ICD-10-CM | POA: Diagnosis not present

## 2020-08-01 DIAGNOSIS — M48062 Spinal stenosis, lumbar region with neurogenic claudication: Secondary | ICD-10-CM | POA: Diagnosis not present

## 2020-08-01 DIAGNOSIS — M9951 Intervertebral disc stenosis of neural canal of cervical region: Secondary | ICD-10-CM | POA: Diagnosis not present

## 2020-08-01 DIAGNOSIS — M25559 Pain in unspecified hip: Secondary | ICD-10-CM | POA: Diagnosis not present

## 2020-08-01 DIAGNOSIS — R519 Headache, unspecified: Secondary | ICD-10-CM | POA: Diagnosis not present

## 2020-08-01 DIAGNOSIS — G894 Chronic pain syndrome: Secondary | ICD-10-CM | POA: Diagnosis not present

## 2020-08-01 DIAGNOSIS — M9931 Osseous stenosis of neural canal of cervical region: Secondary | ICD-10-CM | POA: Diagnosis not present

## 2020-08-01 DIAGNOSIS — M47897 Other spondylosis, lumbosacral region: Secondary | ICD-10-CM | POA: Diagnosis not present

## 2020-08-01 DIAGNOSIS — E1140 Type 2 diabetes mellitus with diabetic neuropathy, unspecified: Secondary | ICD-10-CM | POA: Diagnosis not present

## 2020-08-01 DIAGNOSIS — G8929 Other chronic pain: Secondary | ICD-10-CM | POA: Diagnosis not present

## 2020-08-07 ENCOUNTER — Encounter: Payer: Self-pay | Admitting: Family Medicine

## 2020-08-24 ENCOUNTER — Telehealth: Payer: Self-pay | Admitting: Pharmacist

## 2020-08-24 DIAGNOSIS — E1169 Type 2 diabetes mellitus with other specified complication: Secondary | ICD-10-CM

## 2020-08-24 NOTE — Telephone Encounter (Signed)
Patient doing okay on q10 day ozempic Discussed switching back to vicotza if ozempic is too much for her GI system A1c ordered for future labs Patient to communicate next steps with ozempic vs. victoza She has ozempic via patient assistance in the refrigerator  Patient verbalizes understanding

## 2020-09-03 ENCOUNTER — Other Ambulatory Visit: Payer: Self-pay

## 2020-09-03 ENCOUNTER — Other Ambulatory Visit: Payer: Medicare Other

## 2020-09-03 ENCOUNTER — Other Ambulatory Visit: Payer: Self-pay | Admitting: Family

## 2020-09-03 DIAGNOSIS — E1169 Type 2 diabetes mellitus with other specified complication: Secondary | ICD-10-CM

## 2020-09-03 DIAGNOSIS — F411 Generalized anxiety disorder: Secondary | ICD-10-CM

## 2020-09-03 DIAGNOSIS — G894 Chronic pain syndrome: Secondary | ICD-10-CM | POA: Diagnosis not present

## 2020-09-03 DIAGNOSIS — F331 Major depressive disorder, recurrent, moderate: Secondary | ICD-10-CM

## 2020-09-03 LAB — BAYER DCA HB A1C WAIVED: HB A1C (BAYER DCA - WAIVED): 6.5 % (ref <7.0)

## 2020-09-07 ENCOUNTER — Ambulatory Visit: Payer: Medicare Other | Admitting: Family Medicine

## 2020-09-11 ENCOUNTER — Other Ambulatory Visit: Payer: Self-pay | Admitting: Family

## 2020-09-11 ENCOUNTER — Ambulatory Visit (INDEPENDENT_AMBULATORY_CARE_PROVIDER_SITE_OTHER): Payer: Medicare Other | Admitting: Nurse Practitioner

## 2020-09-11 ENCOUNTER — Other Ambulatory Visit: Payer: Self-pay

## 2020-09-11 ENCOUNTER — Encounter: Payer: Self-pay | Admitting: Nurse Practitioner

## 2020-09-11 VITALS — BP 151/62 | HR 75 | Ht 62.0 in | Wt 152.0 lb

## 2020-09-11 DIAGNOSIS — M25572 Pain in left ankle and joints of left foot: Secondary | ICD-10-CM | POA: Insufficient documentation

## 2020-09-11 DIAGNOSIS — I10 Essential (primary) hypertension: Secondary | ICD-10-CM

## 2020-09-11 NOTE — Assessment & Plan Note (Signed)
Patient reporting ankle pain in the last week.  All symptoms resolved, she is in clinic today because her family wants her to make sure she is not having a blood clot.  On assessment no redness, swelling, tingling, numbness or pain was observed.  Advised patient to please come in if symptoms reoccurs.  Patient verbalized understanding.  Advised patient to apply ice and elevate foot, use ibuprofen/Tylenol as tolerated for pain symptoms.  Patient verbalized understanding.

## 2020-09-11 NOTE — Patient Instructions (Signed)

## 2020-09-11 NOTE — Progress Notes (Signed)
Acute Office Visit  Subjective:    Patient ID: Sonya Cook, female    DOB: 1945-10-01, 75 y.o.   MRN: [id number]  Chief Complaint  Patient presents with   Foot Swelling    And pain. Left   Joint Swelling    Left ankle    HPI Patient is in today for Pain  She reports new onset left ankle pain. was not an injury that may have caused the pain. The pain started a few days ago and is resolved. The pain does not radiate . The pain is described as better, is mild in intensity, occurring intermittently. Symptoms are worse in  the: no current symptoms  Aggravating factors: none Relieving factors: none.  She has tried application of ice with complete relief.   ---------------------------------------------------------------------------------------------------   Past Medical History:  Diagnosis Date   Anxiety    Chronic kidney disease    Depression    Diabetes mellitus without complication ([place])    type II    Dyslipidemia    Family history of adverse reaction to anesthesia    daughter - slow to wake up    GERD (gastroesophageal reflux disease)    Hiatal hernia    Hyperlipidemia    Hypertension    Internal hemorrhoids    Osteopenia    PONV (postoperative nausea and vomiting)    Vitamin D deficiency     Past Surgical History:  Procedure Laterality Date   ABDOMINAL HYSTERECTOMY     LUMBAR DISC SURGERY     right hand surgery for cyst      SHOULDER OPEN ROTATOR CUFF REPAIR Right 04/30/2017   Procedure: Right shoulder mini open rotator cuff repair;  Surgeon: Susa Day, MD;  Location: [facility];  Service: Orthopedics;  Laterality: Right;  Interscalene Block    Family History  Problem Relation Age of Onset   Hodgkin's lymphoma Mother    Cancer Father        Lungs   Colon cancer Neg Hx    Diabetes Neg Hx    Stomach cancer Neg Hx    Rectal cancer Neg Hx     Social History   Socioeconomic History   Marital status: Married    Spouse name: [name]   Number of  children: 2   Years of education: Not on file   Highest education level: Some college, no degree  Occupational History   Occupation: Retired  Tobacco Use   Smoking status: Former    Pack years: 0.00    Types: Cigarettes    Start date: 04/22/2000   Smokeless tobacco: Never  Vaping Use   Vaping Use: Never used  Substance and Sexual Activity   Alcohol use: Yes    Comment: occsional use   Drug use: No   Sexual activity: Not Currently  Other Topics Concern   Not on file  Social History Narrative   Not on file   Social Determinants of Health   Financial Resource Strain: Low Risk    Difficulty of Paying Living Expenses: Not hard at all  Food Insecurity: No Food Insecurity   Worried About Charity fundraiser in the Last Year: Never true   [place] in the Last Year: Never true  Transportation Needs: No Transportation Needs   Lack of Transportation (Medical): No   Lack of Transportation (Non-Medical): No  Physical Activity: Sufficiently Active   Days of Exercise per Week: 7 days   Minutes of Exercise per Session: 30 min  Stress: Stress Concern Present   Feeling of Stress : Very much  Social Connections: Socially Integrated   Frequency of Communication with Friends and Family: More than three times a week   Frequency of Social Gatherings with Friends and Family: More than three times a week   Attends Religious Services: More than 4 times per year   Active Member of [employer] or Organizations: Yes   Attends Music therapist: More than 4 times per year   Marital Status: Married  Human resources officer Violence: Not At Risk   Fear of Current or Ex-Partner: No   Emotionally Abused: No   Physically Abused: No   Sexually Abused: No    Outpatient Medications Prior to Visit  Medication Sig Dispense Refill   atorvastatin (LIPITOR) 40 MG tablet TAKE ONE (1) TABLET EACH DAY 30 tablet 4   busPIRone (BUSPAR) 5 MG tablet TAKE ONE TABLET 3 TIMES A DAY AS NEEDED. 90 tablet 4    DULoxetine (CYMBALTA) 60 MG capsule TAKE ONE (1) CAPSULE EACH DAY 90 capsule 0   fentaNYL (DURAGESIC) 50 MCG/HR Place 75 mcg/hr onto the skin every other day.      lisinopril (ZESTRIL) 10 MG tablet Take 1 tablet (10 mg total) by mouth daily. 90 tablet 1   NARCAN 4 MG/0.1ML LIQD nasal spray kit      Omega-3 Fatty Acids (FISH OIL MAXIMUM STRENGTH) 1200 MG CAPS Take 3,600 mg by mouth 2 (two) times daily.      omeprazole-sodium bicarbonate (ZEGERID) 40-1100 MG capsule Take 1 capsule by mouth 2 (two) times daily.      ondansetron (ZOFRAN) 4 MG tablet TAKE 1 TABLET EVERY 8 HOURS AS NEEDED FOR NAUSEA AND VOMITING 20 tablet 2   Semaglutide,0.25 or 0.5MG/DOS, (OZEMPIC, 0.25 OR 0.5 MG/DOSE,) 2 MG/1.5ML SOPN Inject 0.25 mg into the skin once a week.     UNIFINE PENTIPS 31G X 6 MM MISC      Coenzyme Q10 (COQ10 PO) Take 1 tablet by mouth daily.     baclofen (LIORESAL) 10 MG tablet Take 1 tablet (10 mg total) by mouth daily. 90 each 3   Prasterone, DHEA, 50 MG CAPS Take 1 capsule (50 mg total) by mouth daily. 90 capsule PRN   No facility-administered medications prior to visit.    Allergies  Allergen Reactions   Codeine     REACTION: hives   Penicillins Hives    Has patient had a PCN reaction causing immediate rash, facial/tongue/throat swelling, SOB or lightheadedness with hypotension: Yes Has patient had a PCN reaction causing severe rash involving mucus membranes or skin necrosis: No Has patient had a PCN reaction that required hospitalization: No Has patient had a PCN reaction occurring within the last 10 years: No If all of the above answers are "NO", then may proceed with Cephalosporin use.     Review of Systems  HENT: Negative.    Respiratory: Negative.    Cardiovascular: Negative.   Gastrointestinal: Negative.   All other systems reviewed and are negative.     Objective:    Physical Exam Vitals reviewed.  Constitutional:      Appearance: Normal appearance. She is normal weight.   HENT:     Head: Normocephalic.     Nose: Nose normal.  Eyes:     Conjunctiva/sclera: Conjunctivae normal.  Cardiovascular:     Rate and Rhythm: Normal rate and regular rhythm.     Pulses: Normal pulses.     Heart sounds: Normal heart sounds.  Pulmonary:     Effort: Pulmonary effort is normal.     Breath sounds: Normal breath sounds.  Abdominal:     General: Bowel sounds are normal.  Musculoskeletal:        General: Normal range of motion.  Skin:    Findings: No rash.  Neurological:     Mental Status: She is alert and oriented to person, place, and time.  Psychiatric:        Behavior: Behavior normal.    BP (!) 151/62   Pulse 75   Ht _0  (1.575 m)   Wt 152 lb (68.9 kg)   SpO2 98%   BMI 27.80 kg/m  Wt Readings from Last 3 Encounters:  09/11/20 152 lb (68.9 kg)  05/31/20 152 lb 3.2 oz (69 kg)  09/05/19 146 lb 12.8 oz (66.6 kg)    Health Maintenance Due  Topic Date Due   Fecal DNA (Cologuard)  Never done   Zoster Vaccines- Shingrix (1 of 2) Never done   OPHTHALMOLOGY EXAM  09/06/2019   COVID-19 Vaccine (3 - Booster for Moderna series) 02/19/2020   FOOT EXAM  09/04/2020    There are no preventive care reminders to display for this patient.   Lab Results  Component Value Date   TSH 1.340 11/25/2018   Lab Results  Component Value Date   WBC 8.1 05/31/2020   HGB 13.7 05/31/2020   HCT 39.4 05/31/2020   MCV 92 05/31/2020   PLT 199 05/31/2020   Lab Results  Component Value Date   NA 143 05/31/2020   K 5.3 (H) 05/31/2020   CO2 25 05/31/2020   GLUCOSE 98 05/31/2020   BUN 17 05/31/2020   CREATININE 0.93 05/31/2020   BILITOT 0.4 05/31/2020   ALKPHOS 72 05/31/2020   AST 31 05/31/2020   ALT 27 05/31/2020   PROT 6.9 05/31/2020   ALBUMIN 4.3 05/31/2020   CALCIUM 9.9 05/31/2020   EGFR 65 05/31/2020   Lab Results  Component Value Date   CHOL 143 11/25/2018   Lab Results  Component Value Date   HDL 36 (L) 11/25/2018   Lab Results  Component Value  Date   LDLCALC 51 11/25/2018   Lab Results  Component Value Date   TRIG 280 (H) 11/25/2018   Lab Results  Component Value Date   CHOLHDL 4.0 11/25/2018   Lab Results  Component Value Date   HGBA1C 6.5 09/03/2020       Assessment & Plan:   Problem List Items Addressed This Visit       Other   Acute left ankle pain - Primary    Patient reporting ankle pain in the last week.  All symptoms resolved, she is in clinic today because her family wants her to make sure she is not having a blood clot.  On assessment no redness, swelling, tingling, numbness or pain was observed.  Advised patient to please come in if symptoms reoccurs.  Patient verbalized understanding.  Advised patient to apply ice and elevate foot, use ibuprofen/Tylenol as tolerated for pain symptoms.  Patient verbalized understanding.         No orders of the defined types were placed in this encounter.    Ivy Lynn, NP

## 2020-09-12 ENCOUNTER — Ambulatory Visit (INDEPENDENT_AMBULATORY_CARE_PROVIDER_SITE_OTHER): Payer: Medicare Other

## 2020-09-12 VITALS — Ht 62.0 in | Wt 150.0 lb

## 2020-09-12 DIAGNOSIS — Z0000 Encounter for general adult medical examination without abnormal findings: Secondary | ICD-10-CM

## 2020-09-12 NOTE — Progress Notes (Signed)
Subjective:   Sonya Cook is a 75 y.o. female who presents for Medicare Annual (Subsequent) preventive examination.  Virtual Visit via Telephone Note  I connected with  [name] on 09/12/20 at  9:45 AM EDT by telephone and verified that I am speaking with the correct person using two identifiers.  Location: Patient: Home Provider: [facility] Persons participating in the virtual visit: patient/Nurse Health Advisor   I discussed the limitations, risks, security and privacy concerns of performing an evaluation and management service by telephone and the availability of in person appointments. The patient expressed understanding and agreed to proceed.  Interactive audio and video telecommunications were attempted between this nurse and patient, however failed, due to patient having technical difficulties OR patient did not have access to video capability.  We continued and completed visit with audio only.  Some vital signs may be absent or patient reported.   [name], LPN   Review of Systems     Cardiac Risk Factors include: advanced age (>17mn, >>70women);diabetes mellitus;dyslipidemia;hypertension;sedentary lifestyle     Objective:    Today's Vitals   09/12/20 0956  Weight: 150 lb (68 kg)  Height: 5' 2"  (1.575 m)   Body mass index is 27.44 kg/m.  Advanced Directives 09/12/2020 09/12/2019 09/10/2018 05/26/2017 04/27/2017 11/17/2014  Does Patient Have a Medical Advance Directive? No No No No Yes No  Type of Advance Directive - - - - HPress photographerLiving will -  Does patient want to make changes to medical advance directive? - - - - No - Patient declined -  [place]? - - - - No - copy requested -  Would patient like information on creating a medical advance directive? Yes (MAU/Ambulatory/Procedural Areas - Information given) No - Patient declined Yes (MAU/Ambulatory/Procedural Areas - Information given) No - Patient  declined - -    Current Medications (verified) Outpatient Encounter Medications as of 09/12/2020  Medication Sig   atorvastatin (LIPITOR) 40 MG tablet TAKE ONE (1) TABLET EACH DAY   busPIRone (BUSPAR) 5 MG tablet TAKE ONE TABLET 3 TIMES A DAY AS NEEDED.   DULoxetine (CYMBALTA) 60 MG capsule TAKE ONE (1) CAPSULE EACH DAY   fentaNYL (DURAGESIC) 50 MCG/HR Place 75 mcg/hr onto the skin every other day.    lisinopril (ZESTRIL) 10 MG tablet TAKE ONE (1) TABLET EACH DAY   NARCAN 4 MG/0.1ML LIQD nasal spray kit    Omega-3 Fatty Acids (FISH OIL MAXIMUM STRENGTH) 1200 MG CAPS Take 3,600 mg by mouth 2 (two) times daily.    omeprazole-sodium bicarbonate (ZEGERID) 40-1100 MG capsule Take 1 capsule by mouth 2 (two) times daily.    ondansetron (ZOFRAN) 4 MG tablet TAKE 1 TABLET EVERY 8 HOURS AS NEEDED FOR NAUSEA AND VOMITING   Semaglutide,0.25 or 0.5MG/DOS, (OZEMPIC, 0.25 OR 0.5 MG/DOSE,) 2 MG/1.5ML SOPN Inject 0.25 mg into the skin once a week.   UNIFINE PENTIPS 31G X 6 MM MISC    No facility-administered encounter medications on file as of 09/12/2020.    Allergies (verified) Codeine and Penicillins   History: Past Medical History:  Diagnosis Date   Anxiety    Chronic kidney disease    Depression    Diabetes mellitus [place] complication [place]    type II    Dyslipidemia    Family history of adverse reaction to anesthesia    daughter - slow to wake up    GERD (gastroesophageal reflux disease)    Hiatal  hernia    Hyperlipidemia    Hypertension    Internal hemorrhoids    Osteopenia    PONV (postoperative nausea and vomiting)    Vitamin D deficiency    Past Surgical History:  Procedure Laterality Date   ABDOMINAL HYSTERECTOMY     LUMBAR DISC SURGERY     right hand surgery for cyst      SHOULDER OPEN ROTATOR CUFF REPAIR Right 04/30/2017   Procedure: Right shoulder mini open rotator cuff repair;  Surgeon: Susa Day, MD;  Location: [facility];  Service: Orthopedics;  Laterality: Right;   Interscalene Block   Family History  Problem Relation Age of Onset   Hodgkin's lymphoma Mother    Cancer Father        Lungs   Colon cancer Neg Hx    Diabetes Neg Hx    Stomach cancer Neg Hx    Rectal cancer Neg Hx    Social History   Socioeconomic History   Marital status: Married    Spouse name: [name]   Number of children: 2   Years of education: Not on file   Highest education level: Some college, no degree  Occupational History   Occupation: Retired  Tobacco Use   Smoking status: Former    Pack years: 0.00    Types: Cigarettes    Start date: 04/22/2000   Smokeless tobacco: Never  Vaping Use   Vaping Use: Never used  Substance and Sexual Activity   Alcohol use: Yes    Comment: occsional use   Drug use: No   Sexual activity: Not Currently  Other Topics Concern   Not on file  Social History Narrative   Lives home with husband and great granddaughter. Son lives 1 hour away   Social Determinants of Health   Financial Resource Strain: Low Risk    Difficulty of Paying Living Expenses: Not hard at all  Food Insecurity: No Food Insecurity   Worried About Charity fundraiser in the Last Year: Never true   Arboriculturist in the Last Year: Never true  Transportation Needs: No Transportation Needs   Lack of Transportation (Medical): No   Lack of Transportation (Non-Medical): No  Physical Activity: Inactive   Days of Exercise per Week: 0 days   Minutes of Exercise per Session: 0 min  Stress: Stress Concern Present   Feeling of Stress : Rather much  Social Connections: Socially Integrated   Frequency of Communication with Friends and Family: More than three times a week   Frequency of Social Gatherings with Friends and Family: Once a week   Attends Religious Services: 1 to 4 times per year   Active Member of [employer] or Organizations: Yes   Attends Archivist Meetings: 1 to 4 times per year   Marital Status: Married    Tobacco Counseling Counseling  given: Not Answered   Clinical Intake:  Pre-visit preparation completed: Yes  Pain : No/denies pain     BMI - recorded: 27.44 Nutritional Status: BMI 25 -29 Overweight Nutritional Risks: None Diabetes: Yes CBG done?: No Did pt. bring in CBG monitor from home?: No  How often do you need to have someone help you when you read instructions, pamphlets, or other written materials from your doctor or pharmacy?: 1 - Never  Nutrition Risk Assessment:  Has the patient had any N/V/D within the last 2 months?  No  Does the patient have any non-healing wounds?  No  Has the patient had any  unintentional weight loss or weight gain?  No   Diabetes:  Is the patient diabetic?  Yes  If diabetic, was a CBG obtained today?  No  Did the patient bring in their glucometer from home?  No  How often do you monitor your CBG's? Once or twice per week.   Financial Strains and Diabetes Management:  Are you having any financial strains with the device, your supplies or your medication? No .  Does the patient want to be seen by Chronic Care Management for management of their diabetes?  No  Would the patient like to be referred to a Nutritionist or for Diabetic Management?  No   Diabetic Exams:  Diabetic Eye Exam: Completed 04/19/2020.   Diabetic Foot Exam: Completed 09/05/2019. Pt has been advised about the importance in completing this exam. Pt is scheduled for diabetic foot exam on next visit    Interpreter Needed?: No  Information entered by :: Vernadette Stutsman, LPN   Activities of Daily Living In your present state of health, do you have any difficulty performing the following activities: 09/12/2020  Hearing? N  Vision? N  Difficulty concentrating or making decisions? N  Walking or climbing stairs? N  Dressing or bathing? N  Doing errands, shopping? N  Preparing Food and eating ? N  Using the Toilet? N  In the past six months, have you accidently leaked urine? Y  Do you have problems with  loss of bowel control? N  Managing your Medications? N  Managing your Finances? N  Housekeeping or managing your Housekeeping? N  Some recent data might be hidden    Patient Care Team: Sharion Balloon, FNP as PCP - [facility] (Ophthalmology) Ilean China, RN as Registered Nurse [place], Royce Macadamia, [facility] (Pharmacist) Mohammed Kindle, MD as Referring Physician (Pain Medicine) Harlen Labs, MD as Referring Physician (Optometry)  Indicate any recent Medical Services you may have received from other than [facility] providers in the past year (date may be approximate).     Assessment:   This is a routine wellness examination for [place].  Hearing/Vision screen Hearing Screening - Comments:: Denies hearing difficulties  Vision Screening - Comments:: Denies vision difficulties - up to date with semi-annual eye exam with Dr Marin Comment in [place] and Dr Alanda Slim in [place]  Dietary issues and exercise activities discussed: Current Exercise Habits: The patient does not participate in regular exercise at present, Exercise limited by: orthopedic condition(s)   Goals Addressed             This Visit's Progress    Exercise 3x per week (30 min per time)   Not on track    Try to exercise for 30 minutes, 3 times weekly        Depression Screen PHQ 2/9 Scores 09/12/2020 09/11/2020 09/12/2019 09/05/2019 08/24/2019 11/25/2018 09/10/2018  PHQ - 2 Score 2 4 1 4 5  0 0  PHQ- 9 Score 6 12 2 13 12  - -    Fall Risk Fall Risk  09/12/2020 09/11/2020 05/31/2020 09/12/2019 09/05/2019  Falls in the past year? 0 0 0 0 0  Number falls in past yr: 0 - - - -  Injury with Fall? 0 - - - -  Risk for fall due to : Medication side effect;Orthopedic patient - - - -  Follow up Falls prevention discussed - - - -    FALL RISK PREVENTION PERTAINING TO THE HOME:  Any stairs in or around the home?  Yes  If so, are there any without handrails? No  Home free of loose throw rugs in walkways, pet  beds, electrical cords, etc? Yes  Adequate lighting in your home to reduce risk of falls? Yes   ASSISTIVE DEVICES UTILIZED TO PREVENT FALLS:  Life alert? No  Use of a cane, walker or w/c? No  Grab bars in the bathroom? Yes  Shower chair or bench in shower? No  Elevated toilet seat or a handicapped toilet? No   TIMED UP AND GO:  Was the test performed? No . Telephonic visit  Cognitive Function: Normal cognitive status assessed by direct observation by this Nurse Health Advisor. No abnormalities found.      6CIT Screen 09/12/2019 09/10/2018  What Year? 0 points 0 points  What month? 0 points 0 points  What time? 0 points 0 points  Count back from 20 0 points 0 points  Months in reverse 0 points 0 points  Repeat phrase 2 points 0 points  Total Score 2 0    Immunizations Immunization History  Administered Date(s) Administered   Influenza Split 03/05/2017   Influenza, High Dose Seasonal PF 12/18/2015, 01/07/2018, 01/22/2019   Influenza, Seasonal, Injecte, Preservative Fre 12/22/2013, 02/10/2015   Influenza,inj,quad, With Preservative 02/28/2017   Influenza-Unspecified 11/29/2013, 02/10/2015   Moderna Sars-Covid-2 Vaccination 08/21/2019, 09/19/2019   Pneumococcal Conjugate-13 06/17/2016   Pneumococcal Polysaccharide-23 03/07/2013   Pneumococcal-Unspecified 08/30/2015   Tdap 09/05/2018   Zoster, Live 08/18/2013    TDAP status: Up to date  Flu Vaccine status: Up to date  Pneumococcal vaccine status: Up to date  Covid-19 vaccine status: Completed vaccines  Qualifies for Shingles Vaccine? Yes   Zostavax completed Yes   Shingrix Completed?: No.    Education has been provided regarding the importance of this vaccine. Patient has been advised to call insurance company to determine out of pocket expense if they have not yet received this vaccine. Advised may also receive vaccine at local pharmacy or Health Dept. Verbalized acceptance and understanding.  Screening Tests Health  Maintenance  Topic Date Due   Fecal DNA (Cologuard)  Never done   Zoster Vaccines- Shingrix (1 of 2) Never done   OPHTHALMOLOGY EXAM  09/06/2019   COVID-19 Vaccine (3 - Booster for Moderna series) 02/19/2020   FOOT EXAM  09/04/2020   MAMMOGRAM  12/18/2026 (Originally 02/29/2012)   INFLUENZA VACCINE  10/29/2020   HEMOGLOBIN A1C  03/05/2021   TETANUS/TDAP  09/04/2028   DEXA SCAN  Completed   Hepatitis C Screening  Completed   PNA vac Low Risk Adult  Completed   HPV VACCINES  Aged Out    Health Maintenance  Health Maintenance Due  Topic Date Due   Fecal DNA (Cologuard)  Never done   Zoster Vaccines- Shingrix (1 of 2) Never done   OPHTHALMOLOGY EXAM  09/06/2019   COVID-19 Vaccine (3 - Booster for Moderna series) 02/19/2020   FOOT EXAM  09/04/2020    Colorectal cancer screening: Type of screening: Colonoscopy. Completed 01/24/2010. Repeat every 10 years she plans to make appt soon  Mammogram status: No longer required due to she refuses.  Bone Density status: Completed 10/06/2016. Results reflect: Bone density results: OSTEOPENIA. Repeat every 2 years.  Lung Cancer Screening: (Low Dose CT Chest recommended if Age 70-80 years, 30 pack-year currently smoking OR have quit w/in 15years.) does not qualify.   Additional Screening:  Hepatitis C Screening: does qualify; Completed 06/17/2016  Vision Screening: Recommended annual ophthalmology exams for early detection of glaucoma and  other disorders of the eye. Is the patient up to date with their annual eye exam?  Yes  Who is the provider or what is the name of the office in which the patient attends annual eye exams? Dr Marin Comment and Alanda Slim If pt is not established with a provider, would they like to be referred to a provider to establish care? No .   Dental Screening: Recommended annual dental exams for proper oral hygiene  Community Resource Referral / Chronic Care Management: CRR required this visit?  No   CCM required this visit?  No       Plan:     I have personally reviewed and noted the following in the patient's chart:   Medical and social history Use of alcohol, tobacco or illicit drugs  Current medications and supplements including opioid prescriptions.  Functional ability and status Nutritional status Physical activity Advanced directives List of other physicians Hospitalizations, surgeries, and ER visits in previous 12 months Vitals Screenings to include cognitive, depression, and falls Referrals and appointments  In addition, I have reviewed and discussed with patient certain preventive protocols, quality metrics, and best practice recommendations. A written personalized care plan for preventive services as well as general preventive health recommendations were provided to patient.     Sandrea Hammond, LPN   11/10/7515   Nurse Notes: None

## 2020-09-12 NOTE — Patient Instructions (Signed)
Sonya Cook , Thank you for taking time to come for your Medicare Wellness Visit. I appreciate your ongoing commitment to your health goals. Please review the following plan we discussed and let me know if I can assist you in the future.   Screening recommendations/referrals: Colonoscopy: Done 01/24/2010 - Repeat in 10 years - Call for appointment soon Mammogram: Declines Bone Density: Done 10/06/2016 - Repeat every 2 years *do at next visit Recommended yearly ophthalmology/optometry visit for glaucoma screening and checkup Recommended yearly dental visit for hygiene and checkup  Vaccinations: Influenza vaccine: done 2021 - Repeat annually Pneumococcal vaccine: Done 03/07/2013 & 08/30/2015 Tdap vaccine: Done 09/05/2018 - Repeat in 10 years Shingles vaccine: Zostavax done 08/18/2013; Due for Shingrix   Covid-19:Done 08/21/2019 & 09/19/2019  Advanced directives: Advance directive discussed with you today. I have provided a copy for you to complete at home and have notarized. Once this is complete please bring a copy in to our office so we can scan it into your chart.  Conditions/risks identified: Aim for 30 minutes of exercise or brisk walking each day, drink 6-8 glasses of water and eat lots of fruits and vegetables.  Next appointment: Follow up in one year for your annual wellness visit    Preventive Care 65 Years and Older, Female Preventive care refers to lifestyle choices and visits with your health care provider that can promote health and wellness. What does preventive care include? A yearly physical exam. This is also called an annual well check. Dental exams once or twice a year. Routine eye exams. Ask your health care provider how often you should have your eyes checked. Personal lifestyle choices, including: Daily care of your teeth and gums. Regular physical activity. Eating a healthy diet. Avoiding tobacco and drug use. Limiting alcohol use. Practicing safe sex. Taking low-dose  aspirin every day. Taking vitamin and mineral supplements as recommended by your health care provider. What happens during an annual well check? The services and screenings done by your health care provider during your annual well check will depend on your age, overall health, lifestyle risk factors, and family history of disease. Counseling  Your health care provider may ask you questions about your: Alcohol use. Tobacco use. Drug use. Emotional well-being. Home and relationship well-being. Sexual activity. Eating habits. History of falls. Memory and ability to understand (cognition). Work and work Statistician. Reproductive health. Screening  You may have the following tests or measurements: Height, weight, and BMI. Blood pressure. Lipid and cholesterol levels. These may be checked every 5 years, or more frequently if you are over 45 years old. Skin check. Lung cancer screening. You may have this screening every year starting at age 35 if you have a 30-pack-year history of smoking and currently smoke or have quit within the past 15 years. Fecal occult blood test (FOBT) of the stool. You may have this test every year starting at age 1. Flexible sigmoidoscopy or colonoscopy. You may have a sigmoidoscopy every 5 years or a colonoscopy every 10 years starting at age 61. Hepatitis C blood test. Hepatitis B blood test. Sexually transmitted disease (STD) testing. Diabetes screening. This is done by checking your blood sugar (glucose) after you have not eaten for a while (fasting). You may have this done every 1-3 years. Bone density scan. This is done to screen for osteoporosis. You may have this done starting at age 40. Mammogram. This may be done every 1-2 years. Talk to your health care provider about how often you should  have regular mammograms. Talk with your health care provider about your test results, treatment options, and if necessary, the need for more tests. Vaccines  Your  health care provider may recommend certain vaccines, such as: Influenza vaccine. This is recommended every year. Tetanus, diphtheria, and acellular pertussis (Tdap, Td) vaccine. You may need a Td booster every 10 years. Zoster vaccine. You may need this after age 40. Pneumococcal 13-valent conjugate (PCV13) vaccine. One dose is recommended after age 71. Pneumococcal polysaccharide (PPSV23) vaccine. One dose is recommended after age 34. Talk to your health care provider about which screenings and vaccines you need and how often you need them. This information is not intended to replace advice given to you by your health care provider. Make sure you discuss any questions you have with your health care provider. Document Released: 04/13/2015 Document Revised: 12/05/2015 Document Reviewed: 01/16/2015 Elsevier Interactive Patient Education  2017 [place] Prevention in the Home Falls can cause injuries. They can happen to people of all ages. There are many things you can do to make your home safe and to help prevent falls. What can I do on the outside of my home? Regularly fix the edges of walkways and driveways and fix any cracks. Remove anything that might make you trip as you walk through a door, such as a raised step or threshold. Trim any bushes or trees on the path to your home. Use bright outdoor lighting. Clear any walking paths of anything that might make someone trip, such as rocks or tools. Regularly check to see if handrails are loose or broken. Make sure that both sides of any steps have handrails. Any raised decks and porches should have guardrails on the edges. Have any leaves, snow, or ice cleared regularly. Use sand or salt on walking paths during winter. Clean up any spills in your garage right away. This includes oil or grease spills. What can I do in the bathroom? Use night lights. Install grab bars by the toilet and in the tub and shower. Do not use towel bars as  grab bars. Use non-skid mats or decals in the tub or shower. If you need to sit down in the shower, use a plastic, non-slip stool. Keep the floor dry. Clean up any water that spills on the floor as soon as it happens. Remove soap buildup in the tub or shower regularly. Attach bath mats securely with double-sided non-slip rug tape. Do not have throw rugs and other things on the floor that can make you trip. What can I do in the bedroom? Use night lights. Make sure that you have a light by your bed that is easy to reach. Do not use any sheets or blankets that are too big for your bed. They should not hang down onto the floor. Have a firm chair that has side arms. You can use this for support while you get dressed. Do not have throw rugs and other things on the floor that can make you trip. What can I do in the kitchen? Clean up any spills right away. Avoid walking on wet floors. Keep items that you use a lot in easy-to-reach places. If you need to reach something above you, use a strong step stool that has a grab bar. Keep electrical cords out of the way. Do not use floor polish or wax that makes floors slippery. If you must use wax, use non-skid floor wax. Do not have throw rugs and other things on the floor  that can make you trip. What can I do with my stairs? Do not leave any items on the stairs. Make sure that there are handrails on both sides of the stairs and use them. Fix handrails that are broken or loose. Make sure that handrails are as long as the stairways. Check any carpeting to make sure that it is firmly attached to the stairs. Fix any carpet that is loose or worn. Avoid having throw rugs at the top or bottom of the stairs. If you do have throw rugs, attach them to the floor with carpet tape. Make sure that you have a light switch at the top of the stairs and the bottom of the stairs. If you do not have them, ask someone to add them for you. What else can I do to help prevent  falls? Wear shoes that: Do not have high heels. Have rubber bottoms. Are comfortable and fit you well. Are closed at the toe. Do not wear sandals. If you use a stepladder: Make sure that it is fully opened. Do not climb a closed stepladder. Make sure that both sides of the stepladder are locked into place. Ask someone to hold it for you, if possible. Clearly mark and make sure that you can see: Any grab bars or handrails. First and last steps. Where the edge of each step is. Use tools that help you move around (mobility aids) if they are needed. These include: Canes. Walkers. Scooters. Crutches. Turn on the lights when you go into a dark area. Replace any light bulbs as soon as they burn out. Set up your furniture so you have a clear path. Avoid moving your furniture around. If any of your floors are uneven, fix them. If there are any pets around you, be aware of where they are. Review your medicines with your doctor. Some medicines can make you feel dizzy. This can increase your chance of falling. Ask your doctor what other things that you can do to help prevent falls. This information is not intended to replace advice given to you by your health care provider. Make sure you discuss any questions you have with your health care provider. Document Released: 01/11/2009 Document Revised: 08/23/2015 Document Reviewed: 04/21/2014 Elsevier Interactive Patient Education  2017 [employer].

## 2020-10-02 DIAGNOSIS — G894 Chronic pain syndrome: Secondary | ICD-10-CM | POA: Diagnosis not present

## 2020-10-02 DIAGNOSIS — M5136 Other intervertebral disc degeneration, lumbar region: Secondary | ICD-10-CM | POA: Diagnosis not present

## 2020-10-02 DIAGNOSIS — M545 Low back pain, unspecified: Secondary | ICD-10-CM | POA: Diagnosis not present

## 2020-10-26 ENCOUNTER — Encounter: Payer: Self-pay | Admitting: Family Medicine

## 2020-10-30 DIAGNOSIS — Z79891 Long term (current) use of opiate analgesic: Secondary | ICD-10-CM | POA: Diagnosis not present

## 2020-10-30 DIAGNOSIS — M5136 Other intervertebral disc degeneration, lumbar region: Secondary | ICD-10-CM | POA: Diagnosis not present

## 2020-10-30 DIAGNOSIS — G894 Chronic pain syndrome: Secondary | ICD-10-CM | POA: Diagnosis not present

## 2020-10-30 DIAGNOSIS — M545 Low back pain, unspecified: Secondary | ICD-10-CM | POA: Diagnosis not present

## 2020-11-07 ENCOUNTER — Telehealth: Payer: Self-pay | Admitting: Family

## 2020-11-07 NOTE — Telephone Encounter (Signed)
Defer to PCP return tomorrow.

## 2020-11-07 NOTE — Telephone Encounter (Signed)
Pt is about to use her last Ozempic pen and wants to talk to Dennis about what to do. If she needs to start something else or continue with Ozempic.

## 2020-11-07 NOTE — Telephone Encounter (Signed)
Taking injection every 10 days ozempic Not liking side effects Will switch back to victoza Filled out novo nordisk paperwork to send in

## 2020-11-08 ENCOUNTER — Encounter: Payer: Self-pay | Admitting: Family

## 2020-11-08 ENCOUNTER — Ambulatory Visit (INDEPENDENT_AMBULATORY_CARE_PROVIDER_SITE_OTHER): Payer: Medicare Other | Admitting: Family

## 2020-11-08 DIAGNOSIS — F411 Generalized anxiety disorder: Secondary | ICD-10-CM

## 2020-11-08 MED ORDER — BUSPIRONE HCL 7.5 MG PO TABS: 7.5000 mg | ORAL_TABLET | Freq: Three times a day (TID) | ORAL | 3 refills | Status: DC

## 2020-11-08 NOTE — Progress Notes (Signed)
Virtual Visit  Note Due to COVID-19 pandemic this visit was conducted virtually. This visit type was conducted due to national recommendations for restrictions regarding the COVID-19 Pandemic (e.g. social distancing, sheltering in place) in an effort to limit this patient's exposure and mitigate transmission in our community. All issues noted in this document were discussed and addressed.  A physical exam was not performed with this format.  I connected with Sonya Cook on 11/08/20 at 2:55 pm  by telephone and verified that I am speaking with the correct person using two identifiers. Sonya Cook is currently located at home and no one is currently with her during visit. The provider, Evelina Dun, FNP is located in their office at time of visit.  I discussed the limitations, risks, security and privacy concerns of performing an evaluation and management service by telephone and the availability of in person appointments. I also discussed with the patient that there may be a patient responsible charge related to this service. The patient expressed understanding and agreed to proceed.   Sonya Cook, Sonya Cook are scheduled for a virtual visit with your provider today.    Just as we do with appointments in the office, we must obtain your consent to participate.  Your consent will be active for this visit and any virtual visit you may have with one of our providers in the next 365 days.    If you have a MyChart account, I can also send a copy of this consent to you electronically.  All virtual visits are billed to your insurance company just like a traditional visit in the office.  As this is a virtual visit, video technology does not allow for your provider to perform a traditional examination.  This may limit your provider's ability to fully assess your condition.  If your provider identifies any concerns that need to be evaluated in person or the need to arrange testing such as labs, EKG, etc, we  will make arrangements to do so.    Although advances in technology are sophisticated, we cannot ensure that it will always work on either your end or our end.  If the connection with a video visit is poor, we may have to switch to a telephone visit.  With either a video or telephone visit, we are not always able to ensure that we have a secure connection.   I need to obtain your verbal consent now.   Are you willing to proceed with your visit today?   Sonya Cook has provided verbal consent on 11/08/2020 for a virtual visit (video or telephone).   Evelina Dun, [place] 11/08/2020  2:57 PM     History and Present Illness:  Pt calls the office today to discuss increased GAD. She is currently taking Buspar 5 mg TID prn.  Anxiety Presents for follow-up visit. Symptoms include depressed mood, excessive worry, irritability, nervous/anxious behavior and restlessness. Symptoms occur occasionally. The severity of symptoms is moderate.       Review of Systems  Constitutional:  Positive for irritability.  Psychiatric/Behavioral:  The patient is nervous/anxious.     Observations/Objective: No SOB or distress noted   Assessment and Plan: 1. GAD (generalized anxiety disorder) Will increase Buspar to 7.5 mg from 5 mg TID Stress management discussed Keep chronic follow up - busPIRone (BUSPAR) 7.5 MG tablet; Take 1 tablet (7.5 mg total) by mouth 3 (three) times daily.  Dispense: 90 tablet; Refill: 3     I discussed the assessment and  treatment plan with the patient. The patient was provided an opportunity to ask questions and all were answered. The patient agreed with the plan and demonstrated an understanding of the instructions.   The patient was advised to call back or seek an in-person evaluation if the symptoms worsen or if the condition fails to improve as anticipated.  The above assessment and management plan was discussed with the patient. The patient verbalized understanding of  and has agreed to the management plan. Patient is aware to call the clinic if symptoms persist or worsen. Patient is aware when to return to the clinic for a follow-up visit. Patient educated on when it is appropriate to go to the emergency department.   Time call ended:  3:06 pm   I provided 11 minutes of  non face-to-face time during this encounter.    Evelina Dun, FNP

## 2020-11-27 DIAGNOSIS — M48062 Spinal stenosis, lumbar region with neurogenic claudication: Secondary | ICD-10-CM | POA: Diagnosis not present

## 2020-11-27 DIAGNOSIS — M25519 Pain in unspecified shoulder: Secondary | ICD-10-CM | POA: Diagnosis not present

## 2020-11-27 DIAGNOSIS — R519 Headache, unspecified: Secondary | ICD-10-CM | POA: Diagnosis not present

## 2020-11-27 DIAGNOSIS — R5382 Chronic fatigue, unspecified: Secondary | ICD-10-CM | POA: Diagnosis not present

## 2020-11-27 DIAGNOSIS — M47897 Other spondylosis, lumbosacral region: Secondary | ICD-10-CM | POA: Diagnosis not present

## 2020-11-27 DIAGNOSIS — G4700 Insomnia, unspecified: Secondary | ICD-10-CM | POA: Diagnosis not present

## 2020-11-27 DIAGNOSIS — M5136 Other intervertebral disc degeneration, lumbar region: Secondary | ICD-10-CM | POA: Diagnosis not present

## 2020-11-27 DIAGNOSIS — M545 Low back pain, unspecified: Secondary | ICD-10-CM | POA: Diagnosis not present

## 2020-11-27 DIAGNOSIS — M9931 Osseous stenosis of neural canal of cervical region: Secondary | ICD-10-CM | POA: Diagnosis not present

## 2020-11-27 DIAGNOSIS — G8929 Other chronic pain: Secondary | ICD-10-CM | POA: Diagnosis not present

## 2020-11-27 DIAGNOSIS — M79609 Pain in unspecified limb: Secondary | ICD-10-CM | POA: Diagnosis not present

## 2020-11-27 DIAGNOSIS — G894 Chronic pain syndrome: Secondary | ICD-10-CM | POA: Diagnosis not present

## 2020-11-27 DIAGNOSIS — M25559 Pain in unspecified hip: Secondary | ICD-10-CM | POA: Diagnosis not present

## 2020-11-27 DIAGNOSIS — M5416 Radiculopathy, lumbar region: Secondary | ICD-10-CM | POA: Diagnosis not present

## 2020-11-28 ENCOUNTER — Other Ambulatory Visit: Payer: Self-pay | Admitting: Family

## 2020-11-28 DIAGNOSIS — F411 Generalized anxiety disorder: Secondary | ICD-10-CM

## 2020-11-28 DIAGNOSIS — E785 Hyperlipidemia, unspecified: Secondary | ICD-10-CM

## 2020-11-28 DIAGNOSIS — F331 Major depressive disorder, recurrent, moderate: Secondary | ICD-10-CM

## 2020-11-28 DIAGNOSIS — I10 Essential (primary) hypertension: Secondary | ICD-10-CM

## 2020-11-29 ENCOUNTER — Telehealth: Payer: Self-pay | Admitting: Family

## 2020-11-30 ENCOUNTER — Ambulatory Visit (INDEPENDENT_AMBULATORY_CARE_PROVIDER_SITE_OTHER): Payer: Medicare Other | Admitting: Pharmacist

## 2020-11-30 DIAGNOSIS — E1169 Type 2 diabetes mellitus with other specified complication: Secondary | ICD-10-CM

## 2020-12-01 ENCOUNTER — Other Ambulatory Visit: Payer: Self-pay | Admitting: Family

## 2020-12-01 DIAGNOSIS — I10 Essential (primary) hypertension: Secondary | ICD-10-CM

## 2020-12-04 NOTE — Telephone Encounter (Signed)
Returned call

## 2020-12-04 NOTE — Patient Instructions (Signed)
Visit Information  PATIENT GOALS:  Goals Addressed               This Visit's Progress     Patient Stated     T2DM PHARMD GOAL (pt-stated)        Current Barriers:  Unable to independently afford treatment regimen Suboptimal therapeutic regimen for T2DM   Pharmacist Clinical Goal(s):  Over the next 90 days, patient will verbalize ability to afford treatment regimen adhere to plan to optimize therapeutic regimen for T2DM as evidenced by report of adherence to recommended medication management changes through collaboration with PharmD and provider.    Interventions: 1:1 collaboration with Sharion Balloon, FNP regarding development and update of comprehensive plan of care as evidenced by provider attestation and co-signature Inter-disciplinary care team collaboration (see longitudinal plan of care) Comprehensive medication review performed; medication list updated in electronic medical record  Diabetes: Controlled; current treatment: OZEMPIC 0.'25MG'$  SQ WEEKLY;  ENROLLED PATIENT IN NOVO [place] PATIENT CONSIDERING SWITCHING BACK TO [place] FEELS "OFF" ON OZEMPIC FOR A FEW DAYS AFTER THE INJECTION WILL TRY TO GIVE LOWER DOSE (9 CLICKS)--50% OF 0.'25MG'$  OZEMPIC DOSE TO SEE IF THIS WILL HELP HER SUGAR WHILE REDUCING ADVERSE EFFECTS Current glucose readings: fasting glucose: <150, post prandial glucose: N/A Denies hypoglycemic/hyperglycemic symptoms Discussed meal planning options and Plate method for healthy eating Avoid sugary drinks and desserts Incorporate balanced protein, non starchy veggies, 1 serving of carbohydrate with each meal Increase water intake Increase physical activity as able Current exercise: N/A Educated on ABOVE CHANGES, GLP1 Assessed patient finances. ENROLLED IN NOVO [place] PATIENT ASSISTANCE--DOSE CHANGE FORM SENT TO SWITCH BACK TO [place] PER PATIENT REQUEST   Patient Goals/Self-Care Activities Over the next 90  days, patient will:  - take medications as prescribed check glucose 3X WEEKLY, document, and provide at future appointments collaborate with provider on medication access solutions  Follow Up Plan: Telephone follow up appointment with care management team member scheduled for: 2 WEEKS         The patient verbalized understanding of instructions, educational materials, and care plan provided today and declined offer to receive copy of patient instructions, educational materials, and care plan.   Telephone follow up appointment with care management team member scheduled for:  Signature Regina Eck, PharmD, BCPS Clinical Pharmacist, [place]  II Phone [phone]

## 2020-12-04 NOTE — Progress Notes (Signed)
Chronic Care Management Pharmacy Note  11/30/2020 Name:  Sonya Cook MRN:  [id number] DOB:  02/20/1946  Summary: T2DM  Recommendations/Changes made from today's visit: Diabetes: Controlled; current treatment: OZEMPIC 0.25MG SQ WEEKLY;  ENROLLED PATIENT IN NOVO [place] PATIENT ASSISTANCE PROGRAM PATIENT CONSIDERING SWITCHING BACK TO [place] FEELS "OFF" ON OZEMPIC FOR A FEW DAYS AFTER THE INJECTION WILL TRY TO GIVE LOWER DOSE (9 CLICKS)--50% OF 0.27XA OZEMPIC DOSE TO SEE IF THIS WILL HELP HER SUGAR WHILE REDUCING ADVERSE EFFECTS Denies personal and family history of Medullary thyroid cancer (MTC) Current glucose readings: fasting glucose: <150, post prandial glucose: N/A Denies hypoglycemic/hyperglycemic symptoms Discussed meal planning options and Plate method for healthy eating Avoid sugary drinks and desserts Incorporate balanced protein, non starchy veggies, 1 serving of carbohydrate with each meal Increase water intake Increase physical activity as able Current exercise: N/A Educated on ABOVE CHANGES, GLP1 Assessed patient finances. ENROLLED IN NOVO [place] PATIENT ASSISTANCE--DOSE CHANGE FORM SENT TO SWITCH BACK TO [place] PER PATIENT REQUEST   Patient Goals/Self-Care Activities Over the next 90 days, patient will:  - take medications as prescribed check glucose 3X WEEKLY, document, and provide at future appointments collaborate with provider on medication access solutions  Follow Up Plan: Telephone follow up appointment with care management team member scheduled for: 2 WEEKS   Plan:  Subjective: Sonya Cook is an 75 y.o. year old female who is a primary patient of Sharion Balloon, FNP.  The CCM team was consulted for assistance with disease management and care coordination needs.    Engaged with patient by telephone for follow up visit in response to provider referral for pharmacy case management and/or care coordination services.   Consent  to Services:  The patient was given information about Chronic Care Management services, agreed to services, and gave verbal consent prior to initiation of services.  Please see initial visit note for detailed documentation.   Patient Care Team: Sharion Balloon, FNP as PCP - [facility] [place] (Ophthalmology) Ilean China, RN as Registered Nurse Blanca Friend, Royce Macadamia, [facility] (Pharmacist) Mohammed Kindle, MD as Referring Physician (Pain Medicine) Harlen Labs, MD as Referring Physician (Optometry)  Objective:  Lab Results  Component Value Date   CREATININE 0.93 05/31/2020   CREATININE 1.21 (H) 09/05/2019   CREATININE 1.07 (H) 11/25/2018    Lab Results  Component Value Date   HGBA1C 6.5 09/03/2020   Last diabetic Eye exam:  Lab Results  Component Value Date/Time   HMDIABEYEEXA No Retinopathy 09/06/2018 12:00 AM    Last diabetic Foot exam: No results found for: HMDIABFOOTEX      Component Value Date/Time   CHOL 143 11/25/2018 0815   CHOL 140 08/30/2012 1252   TRIG 280 (H) 11/25/2018 0815   TRIG 316 (H) 08/18/2013 0922   TRIG 330 (H) 08/30/2012 1252   HDL 36 (L) 11/25/2018 0815   HDL 32 (L) 08/18/2013 0922   HDL 33 (L) 08/30/2012 1252   CHOLHDL 4.0 11/25/2018 0815   LDLCALC 51 11/25/2018 0815   LDLCALC 126 (H) 08/18/2013 0922   LDLCALC 41 08/30/2012 1252    Hepatic Function Latest Ref Rng & Units 05/31/2020 09/05/2019 11/25/2018  Total Protein 6.0 - 8.5 g/dL 6.9 6.7 6.5  Albumin 3.7 - 4.7 g/dL 4.3 4.5 4.6  AST 0 - 40 IU/L _0 ALT 0 - 32 IU/L _1 Alk Phosphatase 44 - 121 IU/L 72 69 52  Total Bilirubin 0.0 - 1.2 mg/dL 0.4 0.4 0.3    Lab Results  Component Value Date/Time   TSH 1.340 11/25/2018 08:15 AM   TSH 4.352 09/30/2012 03:37 PM    CBC Latest Ref Rng & Units 05/31/2020 09/05/2019 11/25/2018  WBC 3.4 - 10.8 x10E3/uL 8.1 5.1 6.9  Hemoglobin 11.1 - 15.9 g/dL 13.7 12.2 13.0  Hematocrit 34.0 - 46.6 % 39.4 36.2 38.4  Platelets  150 - 450 x10E3/uL 199 165 189    Lab Results  Component Value Date/Time   VD25OH 43 09/30/2012 03:37 PM    Clinical ASCVD: No  The 10-year ASCVD risk score Mikey Bussing DC Jr., et al., 2013) is: 42.4%   Values used to calculate the score:     Age: 81 years     Sex: Female     Is Non-Hispanic African American: No     Diabetic: Yes     Tobacco smoker: No     Systolic Blood Pressure: 287 mmHg     Is BP treated: Yes     HDL Cholesterol: 36 mg/dL     Total Cholesterol: 143 mg/dL    Other: (CHADS2VASc if Afib, PHQ9 if depression, MMRC or CAT for COPD, ACT, DEXA)  Social History   Tobacco Use  Smoking Status Former   Types: Cigarettes   Start date: 04/22/2000  Smokeless Tobacco Never   BP Readings from Last 3 Encounters:  09/11/20 (!) 151/62  05/31/20 132/80  10/04/19 117/68   Pulse Readings from Last 3 Encounters:  09/11/20 75  05/31/20 74  10/04/19 62   Wt Readings from Last 3 Encounters:  09/12/20 150 lb (68 kg)  09/11/20 152 lb (68.9 kg)  05/31/20 152 lb 3.2 oz (69 kg)    Assessment: Review of patient past medical history, allergies, medications, health status, including review of consultants reports, laboratory and other test data, was performed as part of comprehensive evaluation and provision of chronic care management services.   SDOH:  (Social Determinants of Health) assessments and interventions performed:    CCM Care Plan  Allergies  Allergen Reactions   Codeine     REACTION: hives   Penicillins Hives    Has patient had a PCN reaction causing immediate rash, facial/tongue/throat swelling, SOB or lightheadedness with hypotension: Yes Has patient had a PCN reaction causing severe rash involving mucus membranes or skin necrosis: No Has patient had a PCN reaction that required hospitalization: No Has patient had a PCN reaction occurring within the last 10 years: No If all of the above answers are "NO", then may proceed with Cephalosporin use.      Medications Reviewed Today     Reviewed by Sharion Balloon, FNP (Family Nurse Practitioner) on 11/08/20 at 1457  Med List Status: <None>   Medication Order Taking? Sig Documenting Provider Last Dose Status Informant  atorvastatin (LIPITOR) 40 MG tablet [id number] No TAKE ONE (1) TABLET EACH DAY Hawks, [place] A, FNP Taking Active   busPIRone (BUSPAR) 5 MG tablet [id number] No TAKE ONE TABLET 3 TIMES A DAY AS NEEDED. Evelina Dun A, FNP Taking Active   DULoxetine (CYMBALTA) 60 MG capsule [id number] No TAKE ONE (1) CAPSULE EACH DAY Hawks, [place] A, FNP Taking Active   fentaNYL (DURAGESIC) 50 MCG/HR [id number] No Place 75 mcg/hr onto the skin every other day.  [provider] Taking Active   liraglutide (VICTOZA) 18 MG/3ML SOPN [id number]  Inject 0.6 mg into the skin daily. [provider]  Active  Med Note Lavera Guise   Wed Nov 07, 2020  4:27 PM) Gets via novo nordisk patient assistance  lisinopril (ZESTRIL) 10 MG tablet [id number]  TAKE ONE (1) TABLET EACH DAY Hawks, [place] A, [place]  Active   NARCAN 4 MG/0.1ML LIQD nasal spray kit [id number] No  [provider] Taking Active   Omega-3 Fatty Acids (FISH OIL MAXIMUM STRENGTH) 1200 MG CAPS [id number] No Take 3,600 mg by mouth 2 (two) times daily.  [provider] Taking Active Self  omeprazole-sodium bicarbonate (ZEGERID) 40-1100 MG capsule [id number] No Take 1 capsule by mouth 2 (two) times daily.  [provider] Taking Active Self  ondansetron (ZOFRAN) 4 MG tablet [id number] No TAKE 1 TABLET EVERY 8 HOURS AS NEEDED FOR NAUSEA AND VOMITING Sharion Balloon, FNP Taking Active   UNIFINE PENTIPS 31G X 6 MM MISC [id number] No  [provider] Taking Active Self           Med Note Shary Key Aug 18, 2013  9:30 AM) Received from: External Pharmacy            Patient Active Problem List   Diagnosis Date Noted   Acute left ankle pain 09/11/2020   Hypertension     Overweight (BMI 25.0-29.9) 01/07/2018   Rotator cuff tear, right 04/30/2017   Aortic atherosclerosis ([place]) 04/09/2017   Chronic back pain 11/13/2016   Stage 3 chronic kidney disease ([place]) 08/20/2015   DDD (degenerative disc disease), lumbar 08/20/2015   GERD (gastroesophageal reflux disease) 11/02/2012   Depression 08/30/2012   Osteopenia 08/30/2012   Diabetes ([place]) 08/30/2012   Hyperlipidemia 08/30/2012   Renal insufficiency 08/30/2012    Immunization History  Administered Date(s) Administered   Influenza Split 03/05/2017   Influenza, High Dose Seasonal PF 12/18/2015, 01/07/2018, 01/22/2019   Influenza, Seasonal, Injecte, Preservative Fre 12/22/2013, 02/10/2015   Influenza,inj,quad, With Preservative 02/28/2017   Influenza-Unspecified 11/29/2013, 02/10/2015   Moderna Sars-Covid-2 Vaccination 08/21/2019, 09/19/2019   Pneumococcal Conjugate-13 06/17/2016   Pneumococcal Polysaccharide-23 03/07/2013   Pneumococcal-Unspecified 08/30/2015   Tdap 09/05/2018   Zoster, Live 08/18/2013    Conditions to be addressed/monitored: DMII  Care Plan : PHARMD MEDICATION MANAGEMENT  Updates made by Lavera Guise, [place] since 12/04/2020 12:00 AM     Problem: DISEASE PROGRESSION PREVENTION      Long-Range Goal: T2DM   This Visit's Progress: On track  Priority: High  Note:   Current Barriers:  Unable to independently afford treatment regimen Suboptimal therapeutic regimen for T2DM   Pharmacist Clinical Goal(s):  Over the next 90 days, patient will verbalize ability to afford treatment regimen adhere to plan to optimize therapeutic regimen for T2DM as evidenced by report of adherence to recommended medication management changes through collaboration with PharmD and provider.    Interventions: 1:1 collaboration with Sharion Balloon, FNP regarding development and update of comprehensive plan of care as evidenced by provider attestation and co-signature Inter-disciplinary care team  collaboration (see longitudinal plan of care) Comprehensive medication review performed; medication list updated in electronic medical record  Diabetes: Controlled; current treatment: OZEMPIC 0.25MG SQ WEEKLY;  ENROLLED PATIENT IN NOVO [place] PATIENT CONSIDERING SWITCHING BACK TO [place] FEELS "OFF" ON OZEMPIC FOR A FEW DAYS AFTER THE INJECTION WILL TRY TO GIVE LOWER DOSE (9 CLICKS)--50% OF 0.78ML OZEMPIC DOSE TO SEE IF THIS WILL HELP HER SUGAR WHILE REDUCING ADVERSE EFFECTS Current glucose readings: fasting glucose: <150, post prandial glucose: N/A Denies hypoglycemic/hyperglycemic symptoms Discussed meal  planning options and Plate method for healthy eating Avoid sugary drinks and desserts Incorporate balanced protein, non starchy veggies, 1 serving of carbohydrate with each meal Increase water intake Increase physical activity as able Current exercise: N/A Educated on ABOVE CHANGES, GLP1 Assessed patient finances. ENROLLED IN NOVO [place] PATIENT ASSISTANCE--DOSE CHANGE FORM SENT TO SWITCH BACK TO [place] PER PATIENT REQUEST   Patient Goals/Self-Care Activities Over the next 90 days, patient will:  - take medications as prescribed check glucose 3X WEEKLY, document, and provide at future appointments collaborate with provider on medication access solutions  Follow Up Plan: Telephone follow up appointment with care management team member scheduled for: 2 WEEKS      Medication Assistance:  OZEMPIC obtained through [place] medication assistance program.  Enrollment ends 03/30/21  Patient's preferred pharmacy is:  [facility], [place] - [place] [address] Phone: [phone] Fax: [place], [place] - [address] [place] [address] Phone: [phone] Fax: [phone]  Follow Up:  Patient agrees to Care Plan  and Follow-up.  Plan: Telephone follow up appointment with care management team member scheduled for:  2 WEEKS  Regina Eck, PharmD, BCPS Clinical Pharmacist, [place]  II Phone [phone]

## 2020-12-10 ENCOUNTER — Telehealth: Payer: Self-pay | Admitting: *Deleted

## 2020-12-10 NOTE — Telephone Encounter (Signed)
Pt called - pt assistance med here - VICTOZA #4 pens here in the fridge.   Pt states that she needs Almyra Free to call her when she is back in the office to discuss.  She states she has been trying something different and may not need this med.

## 2020-12-13 ENCOUNTER — Ambulatory Visit: Payer: Medicare Other | Admitting: Pharmacist

## 2020-12-13 DIAGNOSIS — E1169 Type 2 diabetes mellitus with other specified complication: Secondary | ICD-10-CM

## 2020-12-13 NOTE — Patient Instructions (Signed)
Visit Information  PATIENT GOALS:  Goals Addressed               This Visit's Progress     Patient Stated     T2DM PHARMD GOAL (pt-stated)        Current Barriers:  Unable to independently afford treatment regimen Suboptimal therapeutic regimen for T2DM  Pharmacist Clinical Goal(s):  Over the next 90 days, patient will verbalize ability to afford treatment regimen adhere to plan to optimize therapeutic regimen for T2DM as evidenced by report of adherence to recommended medication management changes through collaboration with PharmD and provider.    Interventions: 1:1 collaboration with Sharion Balloon, FNP regarding development and update of comprehensive plan of care as evidenced by provider attestation and co-signature Inter-disciplinary care team collaboration (see longitudinal plan of care) Comprehensive medication review performed; medication list updated in electronic medical record  Diabetes: Controlled; current treatment: OZEMPIC 0.'25MG'$  SQ WEEKLY;  ENROLLED PATIENT IN NOVO [place] PATIENT CONSIDERING SWITCHING BACK TO [place] FEELS "OFF" ON [place] for victoza came in, however we are likely going to switch back to ozempic since patient tolerating well.  We can resubmit dose change form to Novo nordisk if needed Patient doing great on ozempic low dose (9 CLICKS)--50% OF 0.'25MG'$  OZEMPIC DOSE TO SEE IF THIS WILL HELP HER SUGAR WHILE REDUCING ADVERSE EFFECTS Denies personal and family history of Medullary thyroid cancer (MTC) Current glucose readings: fasting glucose: <150, post prandial glucose: N/A Denies hypoglycemic/hyperglycemic symptoms Discussed meal planning options and Plate method for healthy eating Avoid sugary drinks and desserts Incorporate balanced protein, non starchy veggies, 1 serving of carbohydrate with each meal Increase water intake Increase physical activity as  able Current exercise: N/A Educated on ABOVE CHANGES, GLP1 Assessed patient finances. ENROLLED IN NOVO [place] PATIENT ASSISTANCE   Patient Goals/Self-Care Activities Over the next 90 days, patient will:  - take medications as prescribed check glucose 3X WEEKLY, document, and provide at future appointments collaborate with provider on medication access solutions  Follow Up Plan: Telephone follow up appointment with care management team member scheduled for: 2 WEEKS         The patient verbalized understanding of instructions, educational materials, and care plan provided today and declined offer to receive copy of patient instructions, educational materials, and care plan.   Telephone follow up appointment with care management team member scheduled for: 2 WEEKS   Regina Eck, PharmD, BCPS Clinical Pharmacist, [place]  II Phone [phone]

## 2020-12-13 NOTE — Progress Notes (Signed)
Chronic Care Management Pharmacy Note  12/13/2020 Name:  Sonya Cook MRN:  [id number] DOB:  07-Nov-1945  Summary: T2DM  Recommendations/Changes made from today's visit: Diabetes: Controlled; current treatment: OZEMPIC 0.25MG SQ WEEKLY;  ENROLLED PATIENT IN NOVO [place] PATIENT ASSISTANCE PROGRAM PATIENT CONSIDERING SWITCHING BACK TO [place] FEELS "OFF" ON OZEMPIC FOR A FEW DAYS AFTER THE INJECTION Shipment for victoza came in, however we are likely going to switch back to ozempic since patient tolerating well.  We can resubmit dose change form to Novo nordisk if needed Patient doing great on ozempic low dose (9 CLICKS)--50% OF 0.15IF OZEMPIC DOSE TO SEE IF THIS WILL HELP HER SUGAR WHILE REDUCING ADVERSE EFFECTS (REPORTS SIDE EFFECTS ARE GONE!)  Denies personal and family history of Medullary thyroid cancer (MTC) Current glucose readings: fasting glucose: <150, post prandial glucose: N/A Denies hypoglycemic/hyperglycemic symptoms Discussed meal planning options and Plate method for healthy eating Avoid sugary drinks and desserts Incorporate balanced protein, non starchy veggies, 1 serving of carbohydrate with each meal Increase water intake Increase physical activity as able Current exercise: N/A Educated on ABOVE CHANGES, GLP1 Assessed patient finances. ENROLLED IN NOVO [place] PATIENT ASSISTANCE   Follow Up Plan: Telephone follow up appointment with care management team member scheduled for: 2 WEEKS  Subjective: Sonya Cook is an 75 y.o. year old female who is a primary patient of Sharion Balloon, FNP.  The CCM team was consulted for assistance with disease management and care coordination needs.    Engaged with patient by telephone for follow up visit in response to provider referral for pharmacy case management and/or care coordination services.   Consent to Services:  The patient was given information about Chronic Care Management services, agreed to  services, and gave verbal consent prior to initiation of services.  Please see initial visit note for detailed documentation.   Patient Care Team: Sharion Balloon, FNP as PCP - [facility], [place] (Ophthalmology) Ilean China, RN as Registered Nurse Blanca Friend, Royce Macadamia, [facility] (Pharmacist) Mohammed Kindle, MD as Referring Physician (Pain Medicine) Harlen Labs, MD as Referring Physician (Optometry)   Objective:  Lab Results  Component Value Date   CREATININE 0.93 05/31/2020   CREATININE 1.21 (H) 09/05/2019   CREATININE 1.07 (H) 11/25/2018    Lab Results  Component Value Date   HGBA1C 6.5 09/03/2020   Last diabetic Eye exam:  Lab Results  Component Value Date/Time   HMDIABEYEEXA No Retinopathy 09/06/2018 12:00 AM    Last diabetic Foot exam: No results found for: HMDIABFOOTEX      Component Value Date/Time   CHOL 143 11/25/2018 0815   CHOL 140 08/30/2012 1252   TRIG 280 (H) 11/25/2018 0815   TRIG 316 (H) 08/18/2013 0922   TRIG 330 (H) 08/30/2012 1252   HDL 36 (L) 11/25/2018 0815   HDL 32 (L) 08/18/2013 0922   HDL 33 (L) 08/30/2012 1252   CHOLHDL 4.0 11/25/2018 0815   LDLCALC 51 11/25/2018 0815   LDLCALC 126 (H) 08/18/2013 0922   LDLCALC 41 08/30/2012 1252    Hepatic Function Latest Ref Rng & Units 05/31/2020 09/05/2019 11/25/2018  Total Protein 6.0 - 8.5 g/dL 6.9 6.7 6.5  Albumin 3.7 - 4.7 g/dL 4.3 4.5 4.6  AST 0 - 40 IU/L 31 30 22   ALT 0 - 32 IU/L 27 23 17   Alk Phosphatase 44 - 121 IU/L 72 69 52  Total Bilirubin 0.0 - 1.2 mg/dL 0.4 0.4 0.3  Lab Results  Component Value Date/Time   TSH 1.340 11/25/2018 08:15 AM   TSH 4.352 09/30/2012 03:37 PM    CBC Latest Ref Rng & Units 05/31/2020 09/05/2019 11/25/2018  WBC 3.4 - 10.8 x10E3/uL 8.1 5.1 6.9  Hemoglobin 11.1 - 15.9 g/dL 13.7 12.2 13.0  Hematocrit 34.0 - 46.6 % 39.4 36.2 38.4  Platelets 150 - 450 x10E3/uL 199 165 189    Lab Results  Component Value Date/Time   VD25OH 43  09/30/2012 03:37 PM    Clinical ASCVD: No  The 10-year ASCVD risk score (Arnett DK, et al., 2019) is: 42.4%   Values used to calculate the score:     Age: 75 years     Sex: Female     Is Non-Hispanic African American: No     Diabetic: Yes     Tobacco smoker: No     Systolic Blood Pressure: 782 mmHg     Is BP treated: Yes     HDL Cholesterol: 36 mg/dL     Total Cholesterol: 143 mg/dL    Other: (CHADS2VASc if Afib, PHQ9 if depression, MMRC or CAT for COPD, ACT, DEXA)  Social History   Tobacco Use  Smoking Status Former   Types: Cigarettes   Start date: 04/22/2000  Smokeless Tobacco Never   BP Readings from Last 3 Encounters:  09/11/20 (!) 151/62  05/31/20 132/80  10/04/19 117/68   Pulse Readings from Last 3 Encounters:  09/11/20 75  05/31/20 74  10/04/19 62   Wt Readings from Last 3 Encounters:  09/12/20 150 lb (68 kg)  09/11/20 152 lb (68.9 kg)  05/31/20 152 lb 3.2 oz (69 kg)    Assessment: Review of patient past medical history, allergies, medications, health status, including review of consultants reports, laboratory and other test data, was performed as part of comprehensive evaluation and provision of chronic care management services.   SDOH:  (Social Determinants of Health) assessments and interventions performed:    CCM Care Plan  Allergies  Allergen Reactions   Codeine     REACTION: hives   Penicillins Hives    Has patient had a PCN reaction causing immediate rash, facial/tongue/throat swelling, SOB or lightheadedness with hypotension: Yes Has patient had a PCN reaction causing severe rash involving mucus membranes or skin necrosis: No Has patient had a PCN reaction that required hospitalization: No Has patient had a PCN reaction occurring within the last 10 years: No If all of the above answers are "NO", then may proceed with Cephalosporin use.     Medications Reviewed Today     Reviewed by Sharion Balloon, FNP (Family Nurse Practitioner) on  11/08/20 at 1457  Med List Status: <None>   Medication Order Taking? Sig Documenting Provider Last Dose Status Informant  atorvastatin (LIPITOR) 40 MG tablet [id number] No TAKE ONE (1) TABLET EACH DAY Hawks, Ricardo A, FNP Taking Active   busPIRone (BUSPAR) 5 MG tablet [id number] No TAKE ONE TABLET 3 TIMES A DAY AS NEEDED. Evelina Dun A, FNP Taking Active   DULoxetine (CYMBALTA) 60 MG capsule [id number] No TAKE ONE (1) CAPSULE EACH DAY Hawks, [place] A, FNP Taking Active   fentaNYL (DURAGESIC) 50 MCG/HR [id number] No Place 75 mcg/hr onto the skin every other day.  [provider] Taking Active   liraglutide (VICTOZA) 18 MG/3ML SOPN [id number]  Inject 0.6 mg into the skin daily. [provider]  Active            Med Note (Keesha Pellum D  Wed Nov 07, 2020  4:27 PM) Gets via novo nordisk patient assistance  lisinopril (ZESTRIL) 10 MG tablet [id number]  TAKE ONE (1) TABLET EACH DAY Hawks, Luther A, FNP  Active   NARCAN 4 MG/0.1ML LIQD nasal spray kit [id number] No  [provider] Taking Active   Omega-3 Fatty Acids (FISH OIL MAXIMUM STRENGTH) 1200 MG CAPS [id number] No Take 3,600 mg by mouth 2 (two) times daily.  [provider] Taking Active Self  omeprazole-sodium bicarbonate (ZEGERID) 40-1100 MG capsule [id number] No Take 1 capsule by mouth 2 (two) times daily.  [provider] Taking Active Self  ondansetron (ZOFRAN) 4 MG tablet [id number] No TAKE 1 TABLET EVERY 8 HOURS AS NEEDED FOR NAUSEA AND VOMITING Sharion Balloon, FNP Taking Active   UNIFINE PENTIPS 31G X 6 MM MISC [id number] No  [provider] Taking Active Self           Med Note Shary Key Aug 18, 2013  9:30 AM) Received from: External Pharmacy            Patient Active Problem List   Diagnosis Date Noted   Acute left ankle pain 09/11/2020   Hypertension    Overweight (BMI 25.0-29.9) 01/07/2018   Rotator cuff tear, right 04/30/2017   Aortic atherosclerosis ([place])  04/09/2017   Chronic back pain 11/13/2016   Stage 3 chronic kidney disease ([place]) 08/20/2015   DDD (degenerative disc disease), lumbar 08/20/2015   GERD (gastroesophageal reflux disease) 11/02/2012   Depression 08/30/2012   Osteopenia 08/30/2012   Diabetes ([place]) 08/30/2012   Hyperlipidemia 08/30/2012   Renal insufficiency 08/30/2012    Immunization History  Administered Date(s) Administered   Influenza Split 03/05/2017   Influenza, High Dose Seasonal PF 12/18/2015, 01/07/2018, 01/22/2019   Influenza, Seasonal, Injecte, Preservative Fre 12/22/2013, 02/10/2015   Influenza,inj,quad, With Preservative 02/28/2017   Influenza-Unspecified 11/29/2013, 02/10/2015   Moderna Sars-Covid-2 Vaccination 08/21/2019, 09/19/2019   Pneumococcal Conjugate-13 06/17/2016   Pneumococcal Polysaccharide-23 03/07/2013   Pneumococcal-Unspecified 08/30/2015   Tdap 09/05/2018   Zoster, Live 08/18/2013    Conditions to be addressed/monitored: DMII  Care Plan : PHARMD MEDICATION MANAGEMENT  Updates made by Lavera Guise, [place] since 12/13/2020 12:00 AM     Problem: DISEASE PROGRESSION PREVENTION      Long-Range Goal: T2DM   Recent Progress: On track  Priority: High  Note:   Current Barriers:  Unable to independently afford treatment regimen Suboptimal therapeutic regimen for T2DM  Pharmacist Clinical Goal(s):  Over the next 90 days, patient will verbalize ability to afford treatment regimen adhere to plan to optimize therapeutic regimen for T2DM as evidenced by report of adherence to recommended medication management changes through collaboration with PharmD and provider.    Interventions: 1:1 collaboration with Sharion Balloon, FNP regarding development and update of comprehensive plan of care as evidenced by provider attestation and co-signature Inter-disciplinary care team collaboration (see longitudinal plan of care) Comprehensive medication review performed; medication list updated in  electronic medical record  Diabetes: Controlled; current treatment: OZEMPIC 0.25MG SQ WEEKLY;  ENROLLED PATIENT IN NOVO [place] PATIENT CONSIDERING SWITCHING BACK TO [place] FEELS "OFF" ON [place] for victoza came in, however we are likely going to switch back to ozempic since patient tolerating well.  We can resubmit dose change form to Novo nordisk if needed Patient doing great on ozempic low dose (9 CLICKS)--50% OF 4.85IO OZEMPIC DOSE TO SEE IF THIS  WILL HELP HER SUGAR WHILE REDUCING ADVERSE EFFECTS Denies personal and family history of Medullary thyroid cancer (MTC) Current glucose readings: fasting glucose: <150, post prandial glucose: N/A Denies hypoglycemic/hyperglycemic symptoms Discussed meal planning options and Plate method for healthy eating Avoid sugary drinks and desserts Incorporate balanced protein, non starchy veggies, 1 serving of carbohydrate with each meal Increase water intake Increase physical activity as able Current exercise: N/A Educated on ABOVE CHANGES, GLP1 Assessed patient finances. ENROLLED IN NOVO [place] PATIENT ASSISTANCE   Patient Goals/Self-Care Activities Over the next 90 days, patient will:  - take medications as prescribed check glucose 3X WEEKLY, document, and provide at future appointments collaborate with provider on medication access solutions  Follow Up Plan: Telephone follow up appointment with care management team member scheduled for: 2 WEEKS      Medication Assistance:  OZEMPIC/VICTOZA obtained through [place]  medication assistance program.  Enrollment ends 03/30/21  Patient's preferred pharmacy is:  [facility], [place] [address] Phone: [phone] Fax: [place], [place] - [address] [place]  [address] Phone: [phone] Fax: [phone]  Follow Up:  Patient agrees to Care Plan and Follow-up.  Plan: Telephone follow up appointment with care management team member scheduled for:  2 WEEKS  Regina Eck, PharmD, BCPS Clinical Pharmacist, [place]  II Phone [phone]

## 2020-12-13 NOTE — Telephone Encounter (Signed)
See pharm note 9/15

## 2020-12-18 ENCOUNTER — Telehealth: Payer: Medicare Other

## 2020-12-28 DIAGNOSIS — E1169 Type 2 diabetes mellitus with other specified complication: Secondary | ICD-10-CM

## 2020-12-31 DIAGNOSIS — Z79891 Long term (current) use of opiate analgesic: Secondary | ICD-10-CM | POA: Diagnosis not present

## 2020-12-31 DIAGNOSIS — G894 Chronic pain syndrome: Secondary | ICD-10-CM | POA: Diagnosis not present

## 2020-12-31 DIAGNOSIS — M545 Low back pain, unspecified: Secondary | ICD-10-CM | POA: Diagnosis not present

## 2020-12-31 DIAGNOSIS — M5136 Other intervertebral disc degeneration, lumbar region: Secondary | ICD-10-CM | POA: Diagnosis not present

## 2021-01-08 ENCOUNTER — Ambulatory Visit (INDEPENDENT_AMBULATORY_CARE_PROVIDER_SITE_OTHER): Payer: Medicare Other | Admitting: Pharmacist

## 2021-01-08 DIAGNOSIS — E1169 Type 2 diabetes mellitus with other specified complication: Secondary | ICD-10-CM

## 2021-01-12 NOTE — Progress Notes (Signed)
Chronic Care Management Pharmacy Note  01/08/2021 Name:  Sonya Cook MRN:  [id number] DOB:  11/12/1945  Summary: T2DM  Recommendations/Changes made from today's visit: Diabetes: Controlled; current treatment: OZEMPIC 0.25MG SQ WEEKLY;  ENROLLED PATIENT IN NOVO [place] well (was on Victoza--now tolerating Ozempic once weekly; MED LIST UPDATED TO REFLECT) Refills submitted via novo nordisk (expect to arrive in 14 business days to PCP office) Denies personal and family history of Medullary thyroid cancer (MTC) Current glucose readings: fasting glucose: <150, post prandial glucose: N/A Denies hypoglycemic/hyperglycemic symptoms Discussed meal planning options and Plate method for healthy eating Avoid sugary drinks and desserts Incorporate balanced protein, non starchy veggies, 1 serving of carbohydrate with each meal Increase water intake Increase physical activity as able Current exercise: N/A Educated on ABOVE CHANGES, GLP1 Assessed patient finances. ENROLLED IN NOVO [place] PATIENT ASSISTANCE  Follow Up Plan: Telephone follow up appointment with care management team member scheduled for: 2 WEEKS  Subjective: Sonya Cook is an 75 y.o. year old female who is a primary patient of Sharion Balloon, FNP.  The CCM team was consulted for assistance with disease management and care coordination needs.    Engaged with patient by telephone for follow up visit in response to provider referral for pharmacy case management and/or care coordination services.   Consent to Services:  The patient was given information about Chronic Care Management services, agreed to services, and gave verbal consent prior to initiation of services.  Please see initial visit note for detailed documentation.   Patient Care Team: Sharion Balloon, FNP as PCP - General (Family Medicine) Associates, [place] (Ophthalmology) Ilean China, RN as Registered  Nurse Blanca Friend, Royce Macadamia, [facility] (Pharmacist) Mohammed Kindle, MD as Referring Physician (Pain Medicine) Harlen Labs, MD as Referring Physician (Optometry)  Objective:  Lab Results  Component Value Date   CREATININE 0.93 05/31/2020   CREATININE 1.21 (H) 09/05/2019   CREATININE 1.07 (H) 11/25/2018    Lab Results  Component Value Date   HGBA1C 6.5 09/03/2020   Last diabetic Eye exam:  Lab Results  Component Value Date/Time   HMDIABEYEEXA No Retinopathy 09/06/2018 12:00 AM    Last diabetic Foot exam: No results found for: HMDIABFOOTEX      Component Value Date/Time   CHOL 143 11/25/2018 0815   CHOL 140 08/30/2012 1252   TRIG 280 (H) 11/25/2018 0815   TRIG 316 (H) 08/18/2013 0922   TRIG 330 (H) 08/30/2012 1252   HDL 36 (L) 11/25/2018 0815   HDL 32 (L) 08/18/2013 0922   HDL 33 (L) 08/30/2012 1252   CHOLHDL 4.0 11/25/2018 0815   LDLCALC 51 11/25/2018 0815   LDLCALC 126 (H) 08/18/2013 0922   LDLCALC 41 08/30/2012 1252    Hepatic Function Latest Ref Rng & Units 05/31/2020 09/05/2019 11/25/2018  Total Protein 6.0 - 8.5 g/dL 6.9 6.7 6.5  Albumin 3.7 - 4.7 g/dL 4.3 4.5 4.6  AST 0 - 40 IU/L _0 ALT 0 - 32 IU/L _1 Alk Phosphatase 44 - 121 IU/L 72 69 52  Total Bilirubin 0.0 - 1.2 mg/dL 0.4 0.4 0.3    Lab Results  Component Value Date/Time   TSH 1.340 11/25/2018 08:15 AM   TSH 4.352 09/30/2012 03:37 PM    CBC Latest Ref Rng & Units 05/31/2020 09/05/2019 11/25/2018  WBC 3.4 - 10.8 x10E3/uL 8.1 5.1 6.9  Hemoglobin 11.1 - 15.9 g/dL 13.7 12.2 13.0  Hematocrit 34.0 - 46.6 % 39.4 36.2 38.4  Platelets 150 - 450 x10E3/uL 199 165 189    Lab Results  Component Value Date/Time   VD25OH 43 09/30/2012 03:37 PM    Clinical ASCVD: No  The 10-year ASCVD risk score (Arnett DK, et al., 2019) is: 42.4%   Values used to calculate the score:     Age: 88 years     Sex: Female     Is Non-Hispanic African American: No     Diabetic: Yes     Tobacco smoker: No     Systolic Blood  Pressure: 151 mmHg     Is BP treated: Yes     HDL Cholesterol: 36 mg/dL     Total Cholesterol: 143 mg/dL    Other: (CHADS2VASc if Afib, PHQ9 if depression, MMRC or CAT for COPD, ACT, DEXA)  Social History   Tobacco Use  Smoking Status Former   Types: Cigarettes   Start date: 04/22/2000  Smokeless Tobacco Never   BP Readings from Last 3 Encounters:  09/11/20 (!) 151/62  05/31/20 132/80  10/04/19 117/68   Pulse Readings from Last 3 Encounters:  09/11/20 75  05/31/20 74  10/04/19 62   Wt Readings from Last 3 Encounters:  09/12/20 150 lb (68 kg)  09/11/20 152 lb (68.9 kg)  05/31/20 152 lb 3.2 oz (69 kg)    Assessment: Review of patient past medical history, allergies, medications, health status, including review of consultants reports, laboratory and other test data, was performed as part of comprehensive evaluation and provision of chronic care management services.   SDOH:  (Social Determinants of Health) assessments and interventions performed:    CCM Care Plan  Allergies  Allergen Reactions   Codeine     REACTION: hives   Penicillins Hives    Has patient had a PCN reaction causing immediate rash, facial/tongue/throat swelling, SOB or lightheadedness with hypotension: Yes Has patient had a PCN reaction causing severe rash involving mucus membranes or skin necrosis: No Has patient had a PCN reaction that required hospitalization: No Has patient had a PCN reaction occurring within the last 10 years: No If all of the above answers are "NO", then may proceed with Cephalosporin use.     Medications Reviewed Today     Reviewed by Lavera Guise, [facility] (Pharmacist) on 01/12/21 at 37  Med List Status: <None>   Medication Order Taking? Sig Documenting Provider Last Dose Status Informant  atorvastatin (LIPITOR) 40 MG tablet [id number]  TAKE ONE (1) TABLET EACH DAY Hawks, [place] A, FNP  Active   busPIRone (BUSPAR) 7.5 MG tablet [id number]  Take 1 tablet (7.5 mg total) by  mouth 3 (three) times daily. Evelina Dun A, FNP  Active   DULoxetine (CYMBALTA) 60 MG capsule [id number]  TAKE ONE (1) CAPSULE EACH DAY Hawks, [place] A, [place]  Active   fentaNYL (DURAGESIC) 50 MCG/HR [id number] No Place 75 mcg/hr onto the skin every other day.  [provider] Taking Active   lisinopril (ZESTRIL) 10 MG tablet [id number]  TAKE ONE (1) TABLET EACH DAY Hawks, [place] A, FNP  Active   NARCAN 4 MG/0.1ML LIQD nasal spray kit [id number] No  [provider] Taking Active   Omega-3 Fatty Acids (FISH OIL MAXIMUM STRENGTH) 1200 MG CAPS [id number] No Take 3,600 mg by mouth 2 (two) times daily.  [provider] Taking Active Self  omeprazole-sodium bicarbonate (ZEGERID) 40-1100 MG capsule [id number] No Take 1 capsule by mouth 2 (two) times daily.  [provider] Taking Active Self  ondansetron (ZOFRAN) 4 MG tablet [id number] No TAKE 1 TABLET EVERY 8 HOURS AS NEEDED FOR NAUSEA AND VOMITING Hawks, Christy A, FNP Taking Active   Semaglutide,0.25 or 0.5MG/DOS, (OZEMPIC, 0.25 OR 0.5 MG/DOSE,) 2 MG/1.5ML SOPN [id number]  Inject 0.25 mg into the skin once a week. [provider]  Active            Med Note Gilmer Mor Dec 04, 2020  9:24 AM) Via novo nordisk patient assistance  UNIFINE PENTIPS 31G X 6 MM MISC [id number] No  [provider] Taking Active Self           Med Note Shary Key Aug 18, 2013  9:30 AM) Received from: External Pharmacy            Patient Active Problem List   Diagnosis Date Noted   Acute left ankle pain 09/11/2020   Hypertension    Overweight (BMI 25.0-29.9) 01/07/2018   Rotator cuff tear, right 04/30/2017   Aortic atherosclerosis ([place]) 04/09/2017   Chronic back pain 11/13/2016   Stage 3 chronic kidney disease ([place]) 08/20/2015   DDD (degenerative disc disease), lumbar 08/20/2015   GERD (gastroesophageal reflux disease) 11/02/2012   Depression 08/30/2012   Osteopenia 08/30/2012   Diabetes ([place])  08/30/2012   Hyperlipidemia 08/30/2012   Renal insufficiency 08/30/2012    Immunization History  Administered Date(s) Administered   Influenza Split 03/05/2017   Influenza, High Dose Seasonal PF 12/18/2015, 01/07/2018, 01/22/2019   Influenza, Seasonal, Injecte, Preservative Fre 12/22/2013, 02/10/2015   Influenza,inj,quad, With Preservative 02/28/2017   Influenza-Unspecified 11/29/2013, 02/10/2015   Moderna Sars-Covid-2 Vaccination 08/21/2019, 09/19/2019   Pneumococcal Conjugate-13 06/17/2016   Pneumococcal Polysaccharide-23 03/07/2013   Pneumococcal-Unspecified 08/30/2015   Tdap 09/05/2018   Zoster, Live 08/18/2013    Conditions to be addressed/monitored: DMII  Care Plan : PHARMD MEDICATION MANAGEMENT  Updates made by Lavera Guise, [place] since 01/12/2021 12:00 AM     Problem: DISEASE PROGRESSION PREVENTION      Long-Range Goal: T2DM   Recent Progress: On track  Priority: High  Note:   Current Barriers:  Unable to independently afford treatment regimen Suboptimal therapeutic regimen for T2DM  Pharmacist Clinical Goal(s):  Over the next 90 days, patient will verbalize ability to afford treatment regimen adhere to plan to optimize therapeutic regimen for T2DM as evidenced by report of adherence to recommended medication management changes through collaboration with PharmD and provider.    Interventions: 1:1 collaboration with Sharion Balloon, FNP regarding development and update of comprehensive plan of care as evidenced by provider attestation and co-signature Inter-disciplinary care team collaboration (see longitudinal plan of care) Comprehensive medication review performed; medication list updated in electronic medical record  Diabetes: Controlled; current treatment: OZEMPIC 0.25MG SQ WEEKLY;  ENROLLED PATIENT IN NOVO [place] well (was on Victoza--now tolerating Ozempic once weekly) Refills submitted via novo nordisk (expect  to arrive in 14 business days to PCP office) Denies personal and family history of Medullary thyroid cancer (MTC) Current glucose readings: fasting glucose: <150, post prandial glucose: N/A Denies hypoglycemic/hyperglycemic symptoms Discussed meal planning options and Plate method for healthy eating Avoid sugary drinks and desserts Incorporate balanced protein, non starchy veggies, 1 serving of carbohydrate with each meal Increase water intake Increase physical activity as able Current exercise: N/A Educated on ABOVE CHANGES, GLP1 Assessed patient finances. ENROLLED IN NOVO [place] PATIENT ASSISTANCE   Patient Goals/Self-Care Activities Over the next 90 days,  patient will:  - take medications as prescribed check glucose 3X WEEKLY, document, and provide at future appointments collaborate with provider on medication access solutions  Follow Up Plan: Telephone follow up appointment with care management team member scheduled for: 2 WEEKS      Medication Assistance:  [place] obtained through [place] medication assistance program.  Enrollment ends 03/30/21  Patient's preferred pharmacy is:  [facility], [place] [address] Phone: [phone] Fax: [place], [place] [address] [place] [address] Phone: [phone] Fax: [phone]  Uses pill box? No - N/A Pt endorses 100% compliance  Follow Up:  Patient agrees to Care Plan and Follow-up.  Plan: Telephone follow up appointment with care management team member scheduled for:  02/2021    Regina Eck, PharmD, BCPS Clinical Pharmacist, [place]  II Phone [phone]

## 2021-01-12 NOTE — Patient Instructions (Signed)
Visit Information  PATIENT GOALS:  Goals Addressed               This Visit's Progress     Patient Stated     T2DM PHARMD GOAL (pt-stated)        Current Barriers:  Unable to independently afford treatment regimen Suboptimal therapeutic regimen for T2DM  Pharmacist Clinical Goal(s):  Over the next 90 days, patient will verbalize ability to afford treatment regimen adhere to plan to optimize therapeutic regimen for T2DM as evidenced by report of adherence to recommended medication management changes through collaboration with PharmD and provider.    Interventions: 1:1 collaboration with Sharion Balloon, FNP regarding development and update of comprehensive plan of care as evidenced by provider attestation and co-signature Inter-disciplinary care team collaboration (see longitudinal plan of care) Comprehensive medication review performed; medication list updated in electronic medical record  Diabetes: Controlled; current treatment: OZEMPIC 0.25MG  SQ WEEKLY;  ENROLLED PATIENT IN NOVO [place] well (was on Victoza--now tolerating Ozempic once weekly) Refills submitted via novo nordisk (expect to arrive in 14 business days to PCP office) Denies personal and family history of Medullary thyroid cancer (MTC) Current glucose readings: fasting glucose: <150, post prandial glucose: N/A Denies hypoglycemic/hyperglycemic symptoms Discussed meal planning options and Plate method for healthy eating Avoid sugary drinks and desserts Incorporate balanced protein, non starchy veggies, 1 serving of carbohydrate with each meal Increase water intake Increase physical activity as able Current exercise: N/A Educated on ABOVE CHANGES, GLP1 Assessed patient finances. ENROLLED IN NOVO [place] PATIENT ASSISTANCE   Patient Goals/Self-Care Activities Over the next 90 days, patient will:  - take medications as prescribed check glucose 3X WEEKLY, document, and  provide at future appointments collaborate with provider on medication access solutions  Follow Up Plan: Telephone follow up appointment with care management team member scheduled for: 2 WEEKS         The patient verbalized understanding of instructions, educational materials, and care plan provided today and declined offer to receive copy of patient instructions, educational materials, and care plan.   Telephone follow up appointment with care management team member scheduled for: 02/2021  Signature Sonya Cook, PharmD, BCPS Clinical Pharmacist, [place]  II Phone [phone]

## 2021-01-28 DIAGNOSIS — E1169 Type 2 diabetes mellitus with other specified complication: Secondary | ICD-10-CM | POA: Diagnosis not present

## 2021-02-04 DIAGNOSIS — G894 Chronic pain syndrome: Secondary | ICD-10-CM | POA: Diagnosis not present

## 2021-02-04 DIAGNOSIS — M5416 Radiculopathy, lumbar region: Secondary | ICD-10-CM | POA: Diagnosis not present

## 2021-02-04 DIAGNOSIS — M5136 Other intervertebral disc degeneration, lumbar region: Secondary | ICD-10-CM | POA: Diagnosis not present

## 2021-02-04 DIAGNOSIS — M545 Low back pain, unspecified: Secondary | ICD-10-CM | POA: Diagnosis not present

## 2021-02-17 DIAGNOSIS — Z23 Encounter for immunization: Secondary | ICD-10-CM | POA: Diagnosis not present

## 2021-02-26 ENCOUNTER — Other Ambulatory Visit: Payer: Self-pay | Admitting: Family

## 2021-02-26 DIAGNOSIS — I10 Essential (primary) hypertension: Secondary | ICD-10-CM

## 2021-02-26 DIAGNOSIS — F411 Generalized anxiety disorder: Secondary | ICD-10-CM

## 2021-02-26 DIAGNOSIS — E785 Hyperlipidemia, unspecified: Secondary | ICD-10-CM

## 2021-02-26 DIAGNOSIS — F331 Major depressive disorder, recurrent, moderate: Secondary | ICD-10-CM

## 2021-03-01 ENCOUNTER — Other Ambulatory Visit: Payer: Self-pay | Admitting: Family

## 2021-03-01 DIAGNOSIS — E785 Hyperlipidemia, unspecified: Secondary | ICD-10-CM

## 2021-03-04 DIAGNOSIS — G894 Chronic pain syndrome: Secondary | ICD-10-CM | POA: Diagnosis not present

## 2021-03-05 ENCOUNTER — Ambulatory Visit: Payer: Medicare Other | Admitting: Pharmacist

## 2021-03-05 DIAGNOSIS — E1169 Type 2 diabetes mellitus with other specified complication: Secondary | ICD-10-CM

## 2021-03-05 DIAGNOSIS — N1831 Chronic kidney disease, stage 3a: Secondary | ICD-10-CM

## 2021-03-15 NOTE — Patient Instructions (Signed)
Visit Information  Thank you for taking time to visit with me today. Please don't hesitate to contact me if I can be of assistance to you before our next scheduled telephone appointment.  Following are the goals we discussed today:  Current Barriers:  Unable to independently afford treatment regimen Suboptimal therapeutic regimen for T2DM  Pharmacist Clinical Goal(s):  Over the next 90 days, patient will verbalize ability to afford treatment regimen adhere to plan to optimize therapeutic regimen for T2DM as evidenced by report of adherence to recommended medication management changes through collaboration with PharmD and provider.    Interventions: 1:1 collaboration with Sharion Balloon, FNP regarding development and update of comprehensive plan of care as evidenced by provider attestation and co-signature Inter-disciplinary care team collaboration (see longitudinal plan of care) Comprehensive medication review performed; medication list updated in electronic medical record  Diabetes: Controlled; current treatment: OZEMPIC 0.25MG  SQ WEEKLY;  ENROLLED PATIENT IN NOVO [place] well (was on Victoza--now tolerating Ozempic once weekly) Denies personal and family history of Medullary thyroid cancer (MTC) Current glucose readings: fasting glucose: <150, post prandial glucose: N/A Denies hypoglycemic/hyperglycemic symptoms Discussed meal planning options and Plate method for healthy eating Avoid sugary drinks and desserts Incorporate balanced protein, non starchy veggies, 1 serving of carbohydrate with each meal Increase water intake Increase physical activity as able Current exercise: N/A Educated on [place], GLP1 Assessed patient finances. ENROLLMENT FOR NOVO [place] PATIENT ASSISTANCE ([place]) SUBMITTED FOR 2023   Patient Goals/Self-Care Activities Over the next 90 days, patient will:  - take medications as prescribed check glucose 3X  WEEKLY, document, and provide at future appointments collaborate with provider on medication access solutions  Follow Up Plan: Telephone follow up appointment with care management team member scheduled for: 3 MONTHS   Please call the care guide team at [phone] if you need to cancel or reschedule your appointment.   The patient verbalized understanding of instructions, educational materials, and care plan provided today and declined offer to receive copy of patient instructions, educational materials, and care plan.    Regina Eck, PharmD, BCPS Clinical Pharmacist, [place]  II Phone [phone]

## 2021-03-15 NOTE — Progress Notes (Signed)
Chronic Care Management Pharmacy Note  03/05/2021 Name:  Sonya Cook MRN:  [id number] DOB:  1945-06-01  Summary: T2DM  Recommendations/Changes made from today's visit:  Diabetes: Controlled; current treatment: OZEMPIC 0.25MG SQ WEEKLY;  ENROLLED PATIENT IN NOVO [place] well (was on Victoza--now tolerating Ozempic once weekly) Denies personal and family history of Medullary thyroid cancer (MTC) Current glucose readings: fasting glucose: <150, post prandial glucose: N/A Denies hypoglycemic/hyperglycemic symptoms Discussed meal planning options and Plate method for healthy eating Avoid sugary drinks and desserts Incorporate balanced protein, non starchy veggies, 1 serving of carbohydrate with each meal Increase water intake Increase physical activity as able Current exercise: N/A Educated on ABOVE CHANGES, GLP1 Assessed patient finances. ENROLLMENT FOR NOVO [place] PATIENT ASSISTANCE ([place]) SUBMITTED FOR 2023 DISCUSSES MEDICATIONS AND GOALS FOR BLOOD PRESSURE AND CHOLESTEROL  Patient Goals/Self-Care Activities Over the next 90 days, patient will:  - take medications as prescribed check glucose 3X WEEKLY, document, and provide at future appointments collaborate with provider on medication access solutions  Follow Up Plan: Telephone follow up appointment with care management team member scheduled for: 3 MONTHS  Subjective: Sonya Cook is an 75 y.o. year old female who is a primary patient of Sharion Balloon, FNP.  The CCM team was consulted for assistance with disease management and care coordination needs.    Engaged with patient by telephone for follow up visit in response to provider referral for pharmacy case management and/or care coordination services.   Consent to Services:  The patient was given information about Chronic Care Management services, agreed to services, and gave verbal consent prior to initiation of  services.  Please see initial visit note for detailed documentation.   Patient Care Team: Sharion Balloon, FNP as PCP - [facility] ([facility] [place] (Ophthalmology) Ilean China, RN as Registered Nurse Blanca Friend, Royce Macadamia, [facility] (Pharmacist) Mohammed Kindle, MD as Referring Physician (Pain Medicine) Harlen Labs, MD as Referring Physician (Optometry)  Objective:  Lab Results  Component Value Date   CREATININE 0.93 05/31/2020   CREATININE 1.21 (H) 09/05/2019   CREATININE 1.07 (H) 11/25/2018    Lab Results  Component Value Date   HGBA1C 6.5 09/03/2020   Last diabetic Eye exam:  Lab Results  Component Value Date/Time   HMDIABEYEEXA No Retinopathy 09/06/2018 12:00 AM    Last diabetic Foot exam: No results found for: HMDIABFOOTEX      Component Value Date/Time   CHOL 143 11/25/2018 0815   CHOL 140 08/30/2012 1252   TRIG 280 (H) 11/25/2018 0815   TRIG 316 (H) 08/18/2013 0922   TRIG 330 (H) 08/30/2012 1252   HDL 36 (L) 11/25/2018 0815   HDL 32 (L) 08/18/2013 0922   HDL 33 (L) 08/30/2012 1252   CHOLHDL 4.0 11/25/2018 0815   LDLCALC 51 11/25/2018 0815   LDLCALC 126 (H) 08/18/2013 0922   LDLCALC 41 08/30/2012 1252    Hepatic Function Latest Ref Rng & Units 05/31/2020 09/05/2019 11/25/2018  Total Protein 6.0 - 8.5 g/dL 6.9 6.7 6.5  Albumin 3.7 - 4.7 g/dL 4.3 4.5 4.6  AST 0 - 40 IU/L _0 ALT 0 - 32 IU/L _1 Alk Phosphatase 44 - 121 IU/L 72 69 52  Total Bilirubin 0.0 - 1.2 mg/dL 0.4 0.4 0.3    Lab Results  Component Value Date/Time   TSH 1.340 11/25/2018 08:15 AM   TSH 4.352 09/30/2012 03:37 PM  CBC Latest Ref Rng & Units 05/31/2020 09/05/2019 11/25/2018  WBC 3.4 - 10.8 x10E3/uL 8.1 5.1 6.9  Hemoglobin 11.1 - 15.9 g/dL 13.7 12.2 13.0  Hematocrit 34.0 - 46.6 % 39.4 36.2 38.4  Platelets 150 - 450 x10E3/uL 199 165 189    Lab Results  Component Value Date/Time   VD25OH 43 09/30/2012 03:37 PM    Clinical ASCVD: No  The 10-year ASCVD  risk score (Arnett DK, et al., 2019) is: 42.4%   Values used to calculate the score:     Age: 26 years     Sex: Female     Is Non-Hispanic African American: No     Diabetic: Yes     Tobacco smoker: No     Systolic Blood Pressure: 629 mmHg     Is BP treated: Yes     HDL Cholesterol: 36 mg/dL     Total Cholesterol: 143 mg/dL    Other: (CHADS2VASc if Afib, PHQ9 if depression, MMRC or CAT for COPD, ACT, DEXA)  Social History   Tobacco Use  Smoking Status Former   Types: Cigarettes   Start date: 04/22/2000  Smokeless Tobacco Never   BP Readings from Last 3 Encounters:  09/11/20 (!) 151/62  05/31/20 132/80  10/04/19 117/68   Pulse Readings from Last 3 Encounters:  09/11/20 75  05/31/20 74  10/04/19 62   Wt Readings from Last 3 Encounters:  09/12/20 150 lb (68 kg)  09/11/20 152 lb (68.9 kg)  05/31/20 152 lb 3.2 oz (69 kg)    Assessment: Review of patient past medical history, allergies, medications, health status, including review of consultants reports, laboratory and other test data, was performed as part of comprehensive evaluation and provision of chronic care management services.   SDOH:  (Social Determinants of Health) assessments and interventions performed:    CCM Care Plan  Allergies  Allergen Reactions   Codeine     REACTION: hives   Penicillins Hives    Has patient had a PCN reaction causing immediate rash, facial/tongue/throat swelling, SOB or lightheadedness with hypotension: Yes Has patient had a PCN reaction causing severe rash involving mucus membranes or skin necrosis: No Has patient had a PCN reaction that required hospitalization: No Has patient had a PCN reaction occurring within the last 10 years: No If all of the above answers are "NO", then may proceed with Cephalosporin use.     Medications Reviewed Today     Reviewed by Lavera Guise, [facility] (Pharmacist) on 03/15/21 at [place] List Status: <None>   Medication Order Taking? Sig  Documenting Provider Last Dose Status Informant  atorvastatin (LIPITOR) 40 MG tablet [id number]  TAKE ONE (1) TABLET EACH DAY Hawks, [place] A, FNP  Active   busPIRone (BUSPAR) 7.5 MG tablet [id number]  Take 1 tablet (7.5 mg total) by mouth 3 (three) times daily. Evelina Dun A, FNP  Active   DULoxetine (CYMBALTA) 60 MG capsule [id number]  TAKE ONE (1) CAPSULE EACH DAY (NEEDS TO BE SEEN BEFORE NEXT REFILL) Sharion Balloon, FNP  Active   fentaNYL (DURAGESIC) 50 MCG/HR [id number] No Place 75 mcg/hr onto the skin every other day.  [provider] Taking Active   lisinopril (ZESTRIL) 10 MG tablet [id number]  Take 1 tablet (10 mg total) by mouth daily. (NEEDS TO BE SEEN BEFORE NEXT REFILL) Sharion Balloon, FNP  Active   NARCAN 4 MG/0.1ML LIQD nasal spray kit [id number] No  [provider] Taking Active   Omega-3 Fatty Acids (FISH  OIL MAXIMUM STRENGTH) 1200 MG CAPS [id number] No Take 3,600 mg by mouth 2 (two) times daily.  [provider] Taking Active Self  omeprazole-sodium bicarbonate (ZEGERID) 40-1100 MG capsule [id number] No Take 1 capsule by mouth 2 (two) times daily.  [provider] Taking Active Self  ondansetron (ZOFRAN) 4 MG tablet [id number] No TAKE 1 TABLET EVERY 8 HOURS AS NEEDED FOR NAUSEA AND VOMITING Hawks, Christy A, FNP Taking Active   Semaglutide,0.25 or 0.5MG/DOS, (OZEMPIC, 0.25 OR 0.5 MG/DOSE,) 2 MG/1.5ML SOPN [id number]  Inject 0.25 mg into the skin once a week. [provider]  Active            Med Note Gilmer Mor Dec 04, 2020  9:24 AM) Via novo nordisk patient assistance  UNIFINE PENTIPS 31G X 6 MM MISC [id number] No  [provider] Taking Active Self           Med Note Shary Key Aug 18, 2013  9:30 AM) Received from: External Pharmacy            Patient Active Problem List   Diagnosis Date Noted   Acute left ankle pain 09/11/2020   Hypertension    Overweight (BMI 25.0-29.9) 01/07/2018   Rotator cuff  tear, right 04/30/2017   Aortic atherosclerosis ([place]) 04/09/2017   Chronic back pain 11/13/2016   Stage 3 chronic kidney disease ([place]) 08/20/2015   DDD (degenerative disc disease), lumbar 08/20/2015   GERD (gastroesophageal reflux disease) 11/02/2012   Depression 08/30/2012   Osteopenia 08/30/2012   Diabetes ([place]) 08/30/2012   Hyperlipidemia 08/30/2012   Renal insufficiency 08/30/2012    Immunization History  Administered Date(s) Administered   Influenza Split 03/05/2017   Influenza, High Dose Seasonal PF 12/18/2015, 01/07/2018, 01/22/2019   Influenza, Seasonal, Injecte, Preservative Fre 12/22/2013, 02/10/2015   Influenza,inj,quad, With Preservative 02/28/2017   Influenza-Unspecified 11/29/2013, 02/10/2015   Moderna Sars-Covid-2 Vaccination 08/21/2019, 09/19/2019   Pneumococcal Conjugate-13 06/17/2016   Pneumococcal Polysaccharide-23 03/07/2013   Pneumococcal-Unspecified 08/30/2015   Tdap 09/05/2018   Zoster, Live 08/18/2013    Conditions to be addressed/monitored: HLD and DMII  Care Plan : PHARMD MEDICATION MANAGEMENT  Updates made by Lavera Guise, [place] since 03/15/2021 12:00 AM     Problem: DISEASE PROGRESSION PREVENTION      Long-Range Goal: T2DM   Recent Progress: On track  Priority: High  Note:   Current Barriers:  Unable to independently afford treatment regimen Suboptimal therapeutic regimen for T2DM  Pharmacist Clinical Goal(s):  Over the next 90 days, patient will verbalize ability to afford treatment regimen adhere to plan to optimize therapeutic regimen for T2DM as evidenced by report of adherence to recommended medication management changes through collaboration with PharmD and provider.    Interventions: 1:1 collaboration with Sharion Balloon, FNP regarding development and update of comprehensive plan of care as evidenced by provider attestation and co-signature Inter-disciplinary care team collaboration (see longitudinal plan of  care) Comprehensive medication review performed; medication list updated in electronic medical record  Diabetes: Controlled; current treatment: OZEMPIC 0.25MG SQ WEEKLY;  ENROLLED PATIENT IN NOVO [place] well (was on Victoza--now tolerating Ozempic once weekly) Denies personal and family history of Medullary thyroid cancer (MTC) Current glucose readings: fasting glucose: <150, post prandial glucose: N/A Denies hypoglycemic/hyperglycemic symptoms Discussed meal planning options and Plate method for healthy eating Avoid sugary drinks and desserts Incorporate balanced protein, non starchy veggies, 1 serving of carbohydrate with each meal Increase water intake  Increase physical activity as able Current exercise: N/A Educated on [place], GLP1 Assessed patient finances. ENROLLMENT FOR NOVO [place] PATIENT ASSISTANCE  ([place]) SUBMITTED FOR 2023   Patient Goals/Self-Care Activities Over the next 90 days, patient will:  - take medications as prescribed check glucose 3X WEEKLY, document, and provide at future appointments collaborate with provider on medication access solutions  Follow Up Plan: Telephone follow up appointment with care management team member scheduled for: 3 MONTHS      Medication Assistance: Application for [facility]  medication assistance program. in process.  Anticipated assistance start date TBD.  See plan of care for additional detail.  Patient's preferred pharmacy is:  [facility] [address], [place] [address] Phone: [phone] Fax: [place], [place] [address] [place] [address] Phone: [phone] Fax: [phone]  Follow Up:  Patient agrees to Care Plan and Follow-up.  Plan: Telephone follow up appointment with care management team member scheduled for:  3 MONTHS  Regina Eck, PharmD, BCPS Clinical Pharmacist, [place]  II Phone [phone]

## 2021-03-21 ENCOUNTER — Telehealth: Payer: Self-pay

## 2021-03-21 NOTE — Telephone Encounter (Signed)
Three boxes of Ozempic from patient assistance placed in refrigerator in lab.  Patient aware.

## 2021-04-04 ENCOUNTER — Other Ambulatory Visit: Payer: Self-pay | Admitting: Family

## 2021-04-04 DIAGNOSIS — F331 Major depressive disorder, recurrent, moderate: Secondary | ICD-10-CM

## 2021-04-04 DIAGNOSIS — F411 Generalized anxiety disorder: Secondary | ICD-10-CM

## 2021-04-04 DIAGNOSIS — I10 Essential (primary) hypertension: Secondary | ICD-10-CM

## 2021-04-04 DIAGNOSIS — E785 Hyperlipidemia, unspecified: Secondary | ICD-10-CM

## 2021-04-16 NOTE — Progress Notes (Signed)
Received notification from [place] regarding approval for [place]. Patient assistance approved from 04/04/21 to 02/27/22.  Phone: [phone]

## 2021-05-01 ENCOUNTER — Other Ambulatory Visit: Payer: Self-pay | Admitting: Family

## 2021-05-01 DIAGNOSIS — E785 Hyperlipidemia, unspecified: Secondary | ICD-10-CM

## 2021-05-01 DIAGNOSIS — I10 Essential (primary) hypertension: Secondary | ICD-10-CM

## 2021-05-07 ENCOUNTER — Encounter: Payer: Self-pay | Admitting: Family

## 2021-05-07 ENCOUNTER — Ambulatory Visit (INDEPENDENT_AMBULATORY_CARE_PROVIDER_SITE_OTHER): Payer: Medicare Other | Admitting: Family

## 2021-05-07 VITALS — BP 115/75 | HR 78 | Temp 96.3°F | Ht 62.0 in | Wt 152.0 lb

## 2021-05-07 DIAGNOSIS — N1831 Chronic kidney disease, stage 3a: Secondary | ICD-10-CM

## 2021-05-07 DIAGNOSIS — M5136 Other intervertebral disc degeneration, lumbar region: Secondary | ICD-10-CM

## 2021-05-07 DIAGNOSIS — R5383 Other fatigue: Secondary | ICD-10-CM | POA: Diagnosis not present

## 2021-05-07 DIAGNOSIS — I700 Atherosclerosis of aorta: Secondary | ICD-10-CM | POA: Diagnosis not present

## 2021-05-07 DIAGNOSIS — Z0001 Encounter for general adult medical examination with abnormal findings: Secondary | ICD-10-CM

## 2021-05-07 DIAGNOSIS — E1169 Type 2 diabetes mellitus with other specified complication: Secondary | ICD-10-CM

## 2021-05-07 DIAGNOSIS — K219 Gastro-esophageal reflux disease without esophagitis: Secondary | ICD-10-CM

## 2021-05-07 DIAGNOSIS — Z0000 Encounter for general adult medical examination without abnormal findings: Secondary | ICD-10-CM | POA: Diagnosis not present

## 2021-05-07 DIAGNOSIS — M545 Low back pain, unspecified: Secondary | ICD-10-CM

## 2021-05-07 DIAGNOSIS — I10 Essential (primary) hypertension: Secondary | ICD-10-CM | POA: Diagnosis not present

## 2021-05-07 DIAGNOSIS — E785 Hyperlipidemia, unspecified: Secondary | ICD-10-CM | POA: Diagnosis not present

## 2021-05-07 DIAGNOSIS — E663 Overweight: Secondary | ICD-10-CM

## 2021-05-07 DIAGNOSIS — G8929 Other chronic pain: Secondary | ICD-10-CM | POA: Diagnosis not present

## 2021-05-07 DIAGNOSIS — F331 Major depressive disorder, recurrent, moderate: Secondary | ICD-10-CM

## 2021-05-07 DIAGNOSIS — M51369 Other intervertebral disc degeneration, lumbar region without mention of lumbar back pain or lower extremity pain: Secondary | ICD-10-CM

## 2021-05-07 DIAGNOSIS — Z23 Encounter for immunization: Secondary | ICD-10-CM

## 2021-05-07 LAB — LIPID PANEL

## 2021-05-07 LAB — BAYER DCA HB A1C WAIVED: HB A1C (BAYER DCA - WAIVED): 6.8 % — ABNORMAL HIGH (ref 4.8–5.6)

## 2021-05-07 NOTE — Patient Instructions (Signed)

## 2021-05-07 NOTE — Progress Notes (Addendum)
Subjective:    Patient ID: Sonya Cook, female    DOB: Mar 15, 1946, 76 y.o.   MRN: [id number]  Chief Complaint  Patient presents with   Medical Management of Chronic Issues   PT presents to the office today for CPE and chronic follow up. PT is followed by Pain Management for chronic back pain. She states her pain management provider needs a letter stating she is medically stable.   She has aortic atherosclerosis and takes Lipitor daily.   She has CKD and avoids NSAID's.  Hypertension This is a chronic problem. The current episode started more than 1 year ago. The problem has been waxing and waning since onset. The problem is uncontrolled. Associated symptoms include shortness of breath (going up steps). Pertinent negatives include no blurred vision, malaise/fatigue or peripheral edema. Risk factors for coronary artery disease include dyslipidemia, diabetes mellitus and sedentary lifestyle. The current treatment provides moderate improvement. Hypertensive end-organ damage includes kidney disease.  Gastroesophageal Reflux She complains of belching and heartburn. This is a chronic problem. The current episode started more than 1 year ago. The problem occurs occasionally. Associated symptoms include fatigue. She has tried a PPI for the symptoms. The treatment provided moderate relief.  Diabetes She presents for her follow-up diabetic visit. She has type 2 diabetes mellitus. Associated symptoms include fatigue. Pertinent negatives for diabetes include no blurred vision and no foot paresthesias. Symptoms are stable. Diabetic complications include nephropathy. Pertinent negatives for diabetic complications include no heart disease or peripheral neuropathy. Risk factors for coronary artery disease include dyslipidemia, diabetes mellitus, hypertension, sedentary lifestyle and post-menopausal. She is following a generally healthy diet. (Does not check at home)  Hyperlipidemia This is a chronic  problem. The current episode started more than 1 year ago. The problem is controlled. Recent lipid tests were reviewed and are normal. Associated symptoms include shortness of breath (going up steps). Current antihyperlipidemic treatment includes statins. The current treatment provides moderate improvement of lipids. Risk factors for coronary artery disease include dyslipidemia, diabetes mellitus, hypertension and a sedentary lifestyle.  Depression        This is a chronic problem.  The current episode started more than 1 year ago.   The problem occurs intermittently.  Associated symptoms include fatigue, hopelessness and sad.  Associated symptoms include no helplessness, not irritable, no restlessness and no suicidal ideas.  Past treatments include SNRIs - Serotonin and norepinephrine reuptake inhibitors.  Compliance with treatment is good. Back Pain This is a chronic problem. The current episode started more than 1 year ago. The problem occurs intermittently. The pain is present in the lumbar spine. The pain is at a severity of 2/10. She has tried analgesics and NSAIDs for the symptoms. The treatment provided mild relief.     Review of Systems  Constitutional:  Positive for fatigue. Negative for malaise/fatigue.  Eyes:  Negative for blurred vision.  Respiratory:  Positive for shortness of breath (going up steps).   Gastrointestinal:  Positive for heartburn.  Musculoskeletal:  Positive for back pain.  Psychiatric/Behavioral:  Positive for depression. Negative for suicidal ideas.   All other systems reviewed and are negative.  Family History  Problem Relation Age of Onset   Hodgkin's lymphoma Mother    Cancer Father        Lungs   Colon cancer Neg Hx    Diabetes Neg Hx    Stomach cancer Neg Hx    Rectal cancer Neg Hx    Social History   Socioeconomic History  Marital status: Married    Spouse name: [name]   Number of children: 2   Years of education: Not on file   Highest  education level: Some college, no degree  Occupational History   Occupation: Retired  Tobacco Use   Smoking status: Former    Types: Cigarettes    Start date: 04/22/2000   Smokeless tobacco: Never  Vaping Use   Vaping Use: Never used  Substance and Sexual Activity   Alcohol use: Yes    Comment: occsional use   Drug use: No   Sexual activity: Not Currently  Other Topics Concern   Not on file  Social History Narrative   Lives home with husband and great granddaughter. Son lives 1 hour away   Social Determinants of Health   Financial Resource Strain: Low Risk    Difficulty of Paying Living Expenses: Not hard at all  Food Insecurity: No Food Insecurity   Worried About Charity fundraiser in the Last Year: Never true   Arboriculturist in the Last Year: Never true  Transportation Needs: No Transportation Needs   Lack of Transportation (Medical): No   Lack of Transportation (Non-Medical): No  Physical Activity: Inactive   Days of Exercise per Week: 0 days   Minutes of Exercise per Session: 0 min  Stress: Stress Concern Present   Feeling of Stress : Rather much  Social Connections: Socially Integrated   Frequency of Communication with Friends and Family: More than three times a week   Frequency of Social Gatherings with Friends and Family: Once a week   Attends Religious Services: 1 to 4 times per year   Active Member of [employer] or Organizations: Yes   Attends Archivist Meetings: 1 to 4 times per year   Marital Status: Married       Objective:   Physical Exam Vitals reviewed.  Constitutional:      General: She is not irritable.She is not in acute distress.    Appearance: She is well-developed.  HENT:     Head: Normocephalic and atraumatic.     Right Ear: Tympanic membrane normal.     Left Ear: Tympanic membrane normal.  Eyes:     Pupils: Pupils are equal, round, and reactive to light.  Neck:     Thyroid: No thyromegaly.  Cardiovascular:     Rate and Rhythm:  Normal rate and regular rhythm.     Heart sounds: Normal heart sounds. No murmur heard. Pulmonary:     Effort: Pulmonary effort is normal. No respiratory distress.     Breath sounds: Normal breath sounds. No wheezing.  Abdominal:     General: Bowel sounds are normal. There is no distension.     Palpations: Abdomen is soft.     Tenderness: There is no abdominal tenderness.  Musculoskeletal:        General: No tenderness. Normal range of motion.     Cervical back: Normal range of motion and neck supple.  Skin:    General: Skin is warm and dry.  Neurological:     Mental Status: She is alert and oriented to person, place, and time.     Cranial Nerves: No cranial nerve deficit.     Deep Tendon Reflexes: Reflexes are normal and symmetric.  Psychiatric:        Behavior: Behavior normal.        Thought Content: Thought content normal.        Judgment: Judgment normal.  BP 115/75 Comment: patient reports at home   Pulse 78    Temp (!) 96.3 F (35.7 C) (Temporal)    Ht 5' 2"  (1.575 m)    Wt 152 lb (68.9 kg)    BMI 27.80 kg/m   Assessment & Plan:  ZAFIRA MUNOS comes in today with chief complaint of Medical Management of Chronic Issues   Diagnosis and orders addressed:  1. Aortic atherosclerosis (HCC) - CMP14+EGFR - CBC with Differential/Platelet  2. Hypertension, unspecified type - CMP14+EGFR - CBC with Differential/Platelet  3. Chronic low back pain, unspecified back pain laterality, unspecified whether sciatica present - CMP14+EGFR - CBC with Differential/Platelet  4. Stage 3a chronic kidney disease (HCC) - CMP14+EGFR - CBC with Differential/Platelet  5. Type 2 diabetes mellitus with other specified complication, without long-term current use of insulin (HCC) - Bayer DCA Hb A1c Waived - CMP14+EGFR - CBC with Differential/Platelet  6. Gastroesophageal reflux disease, unspecified whether esophagitis present - CMP14+EGFR - CBC with Differential/Platelet  7.  Overweight (BMI 25.0-29.9) - CMP14+EGFR - CBC with Differential/Platelet  8. Hyperlipidemia, unspecified hyperlipidemia type - CMP14+EGFR - CBC with Differential/Platelet - Lipid panel  9. Moderate episode of recurrent major depressive disorder (HCC) - CMP14+EGFR - CBC with Differential/Platelet  10. DDD (degenerative disc disease), lumbar - CMP14+EGFR - CBC with Differential/Platelet  11. Other fatigue - CMP14+EGFR - CBC with Differential/Platelet - TSH  12. Annual physical exam - Bayer DCA Hb A1c Waived - CMP14+EGFR - CBC with Differential/Platelet - Lipid panel - TSH   Labs pending Health Maintenance reviewed Diet and exercise encouraged  Follow up plan: 6 months    Evelina Dun, FNP

## 2021-05-07 NOTE — Addendum Note (Signed)
Addended by: Ladean Raya on: 05/07/2021 09:33 AM   Modules accepted: Orders

## 2021-05-08 LAB — CMP14+EGFR
ALT: 20 IU/L (ref 0–32)
AST: 26 IU/L (ref 0–40)
Albumin/Globulin Ratio: 1.9 (ref 1.2–2.2)
Albumin: 4.6 g/dL (ref 3.7–4.7)
Alkaline Phosphatase: 64 IU/L (ref 44–121)
BUN/Creatinine Ratio: 13 (ref 12–28)
BUN: 16 mg/dL (ref 8–27)
Bilirubin Total: 0.4 mg/dL (ref 0.0–1.2)
CO2: 25 mmol/L (ref 20–29)
Calcium: 10 mg/dL (ref 8.7–10.3)
Chloride: 98 mmol/L (ref 96–106)
Creatinine, Ser: 1.22 mg/dL — ABNORMAL HIGH (ref 0.57–1.00)
Globulin, Total: 2.4 g/dL (ref 1.5–4.5)
Glucose: 143 mg/dL — ABNORMAL HIGH (ref 70–99)
Potassium: 4.4 mmol/L (ref 3.5–5.2)
Sodium: 138 mmol/L (ref 134–144)
Total Protein: 7 g/dL (ref 6.0–8.5)
eGFR: 47 mL/min/{1.73_m2} — ABNORMAL LOW (ref >59)

## 2021-05-08 LAB — CBC WITH DIFFERENTIAL/PLATELET
Basophils Absolute: 0.1 10*3/uL (ref 0.0–0.2)
Basos: 1 %
EOS (ABSOLUTE): 0.2 10*3/uL (ref 0.0–0.4)
Eos: 2 %
Hematocrit: 42.1 % (ref 34.0–46.6)
Hemoglobin: 14.3 g/dL (ref 11.1–15.9)
Immature Grans (Abs): 0 10*3/uL (ref 0.0–0.1)
Immature Granulocytes: 0 %
Lymphocytes Absolute: 2.4 10*3/uL (ref 0.7–3.1)
Lymphs: 30 %
MCH: 31.2 pg (ref 26.6–33.0)
MCHC: 34 g/dL (ref 31.5–35.7)
MCV: 92 fL (ref 79–97)
Monocytes Absolute: 0.7 10*3/uL (ref 0.1–0.9)
Monocytes: 9 %
Neutrophils Absolute: 4.7 10*3/uL (ref 1.4–7.0)
Neutrophils: 58 %
Platelets: 193 10*3/uL (ref 150–450)
RBC: 4.58 x10E6/uL (ref 3.77–5.28)
RDW: 12.6 % (ref 11.7–15.4)
WBC: 8.1 10*3/uL (ref 3.4–10.8)

## 2021-05-08 LAB — LIPID PANEL
Chol/HDL Ratio: 3.7 ratio (ref 0.0–4.4)
Cholesterol, Total: 176 mg/dL (ref 100–199)
HDL: 47 mg/dL (ref >39)
LDL Chol Calc (NIH): 93 mg/dL (ref 0–99)
Triglycerides: 212 mg/dL — ABNORMAL HIGH (ref 0–149)
VLDL Cholesterol Cal: 36 mg/dL (ref 5–40)

## 2021-05-08 LAB — TSH: TSH: 2.64 u[IU]/mL (ref 0.450–4.500)

## 2021-05-22 DIAGNOSIS — R2231 Localized swelling, mass and lump, right upper limb: Secondary | ICD-10-CM | POA: Diagnosis not present

## 2021-06-05 DIAGNOSIS — G8929 Other chronic pain: Secondary | ICD-10-CM | POA: Diagnosis not present

## 2021-06-05 DIAGNOSIS — M545 Low back pain, unspecified: Secondary | ICD-10-CM | POA: Diagnosis not present

## 2021-06-05 DIAGNOSIS — M5136 Other intervertebral disc degeneration, lumbar region: Secondary | ICD-10-CM | POA: Diagnosis not present

## 2021-06-05 DIAGNOSIS — G894 Chronic pain syndrome: Secondary | ICD-10-CM | POA: Diagnosis not present

## 2021-06-08 ENCOUNTER — Other Ambulatory Visit: Payer: Self-pay | Admitting: Family

## 2021-06-08 DIAGNOSIS — E785 Hyperlipidemia, unspecified: Secondary | ICD-10-CM

## 2021-06-08 DIAGNOSIS — I10 Essential (primary) hypertension: Secondary | ICD-10-CM

## 2021-06-10 ENCOUNTER — Telehealth: Payer: Self-pay

## 2021-06-10 NOTE — Chronic Care Management (AMB) (Signed)
?  Care Management  ? ?Note ? ?06/10/2021 ?Name: DOMENIQUE QUEST MRN: [id number] DOB: 1945-04-12 ? ?Sonya Cook is a 76 y.o. year old female who is a primary care patient of Sharion Balloon, FNP and is actively engaged with the care management team. I reached out to [employer] by phone today to assist with re-scheduling a follow up visit with the Pharmacist ? ?Follow up plan: ?Unsuccessful telephone outreach attempt made. A HIPAA compliant phone message was left for the patient providing contact information and requesting a return call.  ?The care management team will reach out to the patient again over the next 7 days.  ?If patient returns call to provider office, please advise to call [facility]  at [phone] ? ?Noreene Larsson, RMA ?Care Guide, Embedded Care Coordination ?[facility]  ?[place], [place] [address] ?Direct Dial: [phone] ?[email or web address] ?[email or web address]: [email or web address]  ? ?

## 2021-06-12 ENCOUNTER — Encounter: Payer: Self-pay | Admitting: Family

## 2021-06-14 ENCOUNTER — Telehealth: Payer: Self-pay | Admitting: *Deleted

## 2021-06-14 NOTE — Telephone Encounter (Signed)
Pt called and aware - pt assistance meds are here - this is OZEMPIC #5 PENS  ?Pt will come by and get it ? ?

## 2021-06-18 ENCOUNTER — Telehealth: Payer: Self-pay | Admitting: Family

## 2021-06-21 ENCOUNTER — Telehealth: Payer: Self-pay | Admitting: Pharmacist

## 2021-06-21 DIAGNOSIS — E1169 Type 2 diabetes mellitus with other specified complication: Secondary | ICD-10-CM

## 2021-06-21 MED ORDER — LIRAGLUTIDE 18 MG/3ML ~~LOC~~ SOPN: 1.8000 mg | PEN_INJECTOR | Freq: Every day | SUBCUTANEOUS | 3 refills | Status: DC

## 2021-06-21 NOTE — Telephone Encounter (Signed)
Patient unable to tolerate ozempic low dose ?Switch to victoza ?

## 2021-07-01 ENCOUNTER — Other Ambulatory Visit: Payer: Self-pay | Admitting: Family

## 2021-07-01 DIAGNOSIS — F331 Major depressive disorder, recurrent, moderate: Secondary | ICD-10-CM

## 2021-07-01 DIAGNOSIS — F411 Generalized anxiety disorder: Secondary | ICD-10-CM

## 2021-07-02 ENCOUNTER — Telehealth: Payer: Medicare Other

## 2021-07-03 DIAGNOSIS — R5382 Chronic fatigue, unspecified: Secondary | ICD-10-CM | POA: Diagnosis not present

## 2021-07-03 DIAGNOSIS — M25519 Pain in unspecified shoulder: Secondary | ICD-10-CM | POA: Diagnosis not present

## 2021-07-03 DIAGNOSIS — M25559 Pain in unspecified hip: Secondary | ICD-10-CM | POA: Diagnosis not present

## 2021-07-03 DIAGNOSIS — M47897 Other spondylosis, lumbosacral region: Secondary | ICD-10-CM | POA: Diagnosis not present

## 2021-07-03 DIAGNOSIS — G4700 Insomnia, unspecified: Secondary | ICD-10-CM | POA: Diagnosis not present

## 2021-07-03 DIAGNOSIS — G894 Chronic pain syndrome: Secondary | ICD-10-CM | POA: Diagnosis not present

## 2021-07-03 DIAGNOSIS — I119 Hypertensive heart disease without heart failure: Secondary | ICD-10-CM | POA: Diagnosis not present

## 2021-07-03 DIAGNOSIS — R519 Headache, unspecified: Secondary | ICD-10-CM | POA: Diagnosis not present

## 2021-07-03 DIAGNOSIS — M48062 Spinal stenosis, lumbar region with neurogenic claudication: Secondary | ICD-10-CM | POA: Diagnosis not present

## 2021-07-03 DIAGNOSIS — M5136 Other intervertebral disc degeneration, lumbar region: Secondary | ICD-10-CM | POA: Diagnosis not present

## 2021-07-03 DIAGNOSIS — M9951 Intervertebral disc stenosis of neural canal of cervical region: Secondary | ICD-10-CM | POA: Diagnosis not present

## 2021-07-03 DIAGNOSIS — M79609 Pain in unspecified limb: Secondary | ICD-10-CM | POA: Diagnosis not present

## 2021-07-03 DIAGNOSIS — G8929 Other chronic pain: Secondary | ICD-10-CM | POA: Diagnosis not present

## 2021-07-03 DIAGNOSIS — M545 Low back pain, unspecified: Secondary | ICD-10-CM | POA: Diagnosis not present

## 2021-07-09 NOTE — Telephone Encounter (Signed)
Returned call

## 2021-07-19 ENCOUNTER — Telehealth: Payer: Self-pay | Admitting: Pharmacist

## 2021-07-19 NOTE — Telephone Encounter (Signed)
Please let patient know ?Victoza new shipment (#4 boxes of 3 pens/each) ?Plus pen needles are in fridge for pick up ?

## 2021-07-19 NOTE — Telephone Encounter (Signed)
Left detailed message.   

## 2021-08-01 ENCOUNTER — Ambulatory Visit (INDEPENDENT_AMBULATORY_CARE_PROVIDER_SITE_OTHER): Payer: Medicare Other | Admitting: Family

## 2021-08-01 ENCOUNTER — Encounter: Payer: Self-pay | Admitting: Family

## 2021-08-01 ENCOUNTER — Ambulatory Visit: Payer: Medicare Other | Admitting: Family

## 2021-08-01 VITALS — BP 117/75 | HR 59 | Temp 97.7°F | Resp 16 | Ht 62.0 in | Wt 158.4 lb

## 2021-08-01 DIAGNOSIS — M5136 Other intervertebral disc degeneration, lumbar region: Secondary | ICD-10-CM | POA: Diagnosis not present

## 2021-08-01 DIAGNOSIS — I700 Atherosclerosis of aorta: Secondary | ICD-10-CM

## 2021-08-01 DIAGNOSIS — G8929 Other chronic pain: Secondary | ICD-10-CM

## 2021-08-01 DIAGNOSIS — M545 Low back pain, unspecified: Secondary | ICD-10-CM | POA: Diagnosis not present

## 2021-08-01 DIAGNOSIS — K219 Gastro-esophageal reflux disease without esophagitis: Secondary | ICD-10-CM

## 2021-08-01 DIAGNOSIS — N183 Chronic kidney disease, stage 3 unspecified: Secondary | ICD-10-CM

## 2021-08-01 DIAGNOSIS — I10 Essential (primary) hypertension: Secondary | ICD-10-CM

## 2021-08-01 DIAGNOSIS — E1122 Type 2 diabetes mellitus with diabetic chronic kidney disease: Secondary | ICD-10-CM | POA: Diagnosis not present

## 2021-08-01 DIAGNOSIS — Z23 Encounter for immunization: Secondary | ICD-10-CM

## 2021-08-01 DIAGNOSIS — N1831 Chronic kidney disease, stage 3a: Secondary | ICD-10-CM | POA: Diagnosis not present

## 2021-08-01 DIAGNOSIS — E785 Hyperlipidemia, unspecified: Secondary | ICD-10-CM

## 2021-08-01 DIAGNOSIS — E663 Overweight: Secondary | ICD-10-CM

## 2021-08-01 DIAGNOSIS — F331 Major depressive disorder, recurrent, moderate: Secondary | ICD-10-CM

## 2021-08-01 LAB — BAYER DCA HB A1C WAIVED: HB A1C (BAYER DCA - WAIVED): 7.5 % — ABNORMAL HIGH (ref 4.8–5.6)

## 2021-08-01 NOTE — Progress Notes (Signed)
? ?Subjective:  ? ? Patient ID: Sonya Cook, female    DOB: 1946-03-25, 76 y.o.   MRN: [id number] ? ?PT presents to the office today for chronic follow up. PT is followed by Pain Management for chronic back pain monthly. She states her pain management provider needs a letter stating she is medically stable.  ?  ?She has aortic atherosclerosis and takes Lipitor daily.  ?  ?She has CKD and avoids NSAID's.  ? ?Has aortic atherosclerosis and taking Lipitor 40 mg daily.  ?Hypertension ?This is a chronic problem. The current episode started more than 1 year ago. The problem has been resolved since onset. The problem is controlled. Associated symptoms include malaise/fatigue. Pertinent negatives include no blurred vision, peripheral edema or shortness of breath. Risk factors for coronary artery disease include dyslipidemia. The current treatment provides moderate improvement. Hypertensive end-organ damage includes kidney disease.  ?Gastroesophageal Reflux ?She complains of belching and heartburn. This is a chronic problem. The current episode started more than 1 year ago. The problem occurs rarely. The problem has been waxing and waning. The symptoms are aggravated by smoking. Associated symptoms include fatigue. She has tried a PPI for the symptoms. The treatment provided moderate relief.  ?Diabetes ?She presents for her follow-up diabetic visit. She has type 2 diabetes mellitus. There are no hypoglycemic associated symptoms. Associated symptoms include fatigue. Pertinent negatives for diabetes include no blurred vision and no foot paresthesias. Symptoms are stable. Diabetic complications include nephropathy. Pertinent negatives for diabetic complications include no heart disease or peripheral neuropathy. Risk factors for coronary artery disease include dyslipidemia, diabetes mellitus, hypertension, sedentary lifestyle and post-menopausal. She is following a generally healthy diet. Her overall blood glucose range is  110-130 mg/dl. Eye exam is current.  ?Hyperlipidemia ?This is a chronic problem. The current episode started more than 1 year ago. The problem is controlled. Pertinent negatives include no shortness of breath. Current antihyperlipidemic treatment includes statins. The current treatment provides moderate improvement of lipids. Risk factors for coronary artery disease include dyslipidemia, diabetes mellitus, hypertension, a sedentary lifestyle and post-menopausal.  ?Depression ?       This is a chronic problem.  The current episode started more than 1 year ago.   Associated symptoms include fatigue, restlessness and sad.  Associated symptoms include no helplessness and no hopelessness.  Past treatments include SNRIs - Serotonin and norepinephrine reuptake inhibitors. ?Back Pain ?This is a chronic problem. The current episode started more than 1 year ago. The problem has been waxing and waning since onset. The pain is present in the lumbar spine. The pain is at a severity of 4/10. The pain is moderate. Risk factors include obesity. She has tried analgesics for the symptoms. The treatment provided moderate relief.  ? ? ? ?Review of Systems  ?Constitutional:  Positive for fatigue and malaise/fatigue.  ?Eyes:  Negative for blurred vision.  ?Respiratory:  Negative for shortness of breath.   ?Gastrointestinal:  Positive for heartburn.  ?Musculoskeletal:  Positive for back pain.  ?Psychiatric/Behavioral:  Positive for depression.   ?All other systems reviewed and are negative. ? ?   ?Objective:  ? Physical Exam ?Vitals reviewed.  ?Constitutional:   ?   General: She is not in acute distress. ?   Appearance: She is well-developed. She is obese.  ?HENT:  ?   Head: Normocephalic and atraumatic.  ?   Right Ear: Tympanic membrane normal.  ?   Left Ear: Tympanic membrane normal.  ?Eyes:  ?   Pupils: Pupils  are equal, round, and reactive to light.  ?Neck:  ?   Thyroid: No thyromegaly.  ?Cardiovascular:  ?   Rate and Rhythm: Normal  rate and regular rhythm.  ?   Heart sounds: Normal heart sounds. No murmur heard. ?Pulmonary:  ?   Effort: Pulmonary effort is normal. No respiratory distress.  ?   Breath sounds: Normal breath sounds. No wheezing.  ?Abdominal:  ?   General: Bowel sounds are normal. There is no distension.  ?   Palpations: Abdomen is soft.  ?   Tenderness: There is no abdominal tenderness.  ?Musculoskeletal:     ?   General: No tenderness. Normal range of motion.  ?   Cervical back: Normal range of motion and neck supple.  ?Skin: ?   General: Skin is warm and dry.  ?Neurological:  ?   Mental Status: She is alert and oriented to person, place, and time.  ?   Cranial Nerves: No cranial nerve deficit.  ?   Deep Tendon Reflexes: Reflexes are normal and symmetric.  ?Psychiatric:     ?   Behavior: Behavior normal.     ?   Thought Content: Thought content normal.     ?   Judgment: Judgment normal.  ? ? ?BP 117/75   Pulse (!) 59   Temp 97.7 ?F (36.5 ?C)   Resp 16   Ht _0  (1.575 m)   Wt 158 lb 6.4 oz (71.8 kg)   SpO2 98%   BMI 28.97 kg/m?  ? ?   ?Assessment & Plan:  ?Sonya Cook comes in today with chief complaint of Medical Management of Chronic Issues ? ? ?Diagnosis and orders addressed: ? ?1. Need for shingles vaccine ?- Varicella-zoster vaccine IM (Shingrix) ?- CMP14+EGFR ?- CBC with Differential/Platelet ? ?2. Primary hypertension ?- CMP14+EGFR ?- CBC with Differential/Platelet ? ?3. Aortic atherosclerosis ([place]) ?- CMP14+EGFR ?- CBC with Differential/Platelet ? ?4. Gastroesophageal reflux disease, unspecified whether esophagitis present ?- CMP14+EGFR ?- CBC with Differential/Platelet ? ?5. Type 2 diabetes mellitus with stage 3 chronic kidney disease, without long-term current use of insulin, unspecified whether stage 3a or 3b CKD ([place]) ?- Bayer DCA Hb A1c Waived ?- CMP14+EGFR ?- CBC with Differential/Platelet ?- Microalbumin / creatinine urine ratio ? ?6. Stage 3a chronic kidney disease ([place]) ?- CMP14+EGFR ?- CBC with  Differential/Platelet ? ?7. DDD (degenerative disc disease), lumbar ?- CMP14+EGFR ?- CBC with Differential/Platelet ? ?8. Chronic low back pain, unspecified back pain laterality, unspecified whether sciatica present ?- CMP14+EGFR ?- CBC with Differential/Platelet ? ?9. Moderate episode of recurrent major depressive disorder ([place]) ? ?- CMP14+EGFR ?- CBC with Differential/Platelet ? ?10. Overweight (BMI 25.0-29.9) ?- CMP14+EGFR ?- CBC with Differential/Platelet ? ?11. Hyperlipidemia, unspecified hyperlipidemia type ?- CMP14+EGFR ?- CBC with Differential/Platelet ? ? ?Labs pending ?Health Maintenance reviewed ?Diet and exercise encouraged ? ?Follow up plan: ?6 months  ? ? ?Evelina Dun, FNP ? ? ? ?

## 2021-08-01 NOTE — Patient Instructions (Signed)
Hyperhidrosis ?Hyperhidrosis is a condition in which the body sweats a lot more than normal (excessively). Sweating is a necessary function for a human body. It is normal to sweat when you are hot, physically active, or anxious. However, hyperhidrosis is sweating to an excessive degree. Although the condition is not a serious one, it can make you feel embarrassed. ?There are two kinds of hyperhidrosis: ?Primary hyperhidrosis. The sweating usually localizes in one part of your body, such as your underarms, or in a few areas, such as your feet, face, underarms, and hands. This is the more common kind of hyperhidrosis. ?Secondary hyperhidrosis. This type usually affects your entire body. ?What are the causes? ?The cause of this condition depends on the kind of hyperhidrosis that you have. ?Primary hyperhidrosis may be caused by sweat glands that are more active than normal. ?Secondary hyperhidrosis may be caused by an underlying condition or by taking certain medicines, such as antidepressants or diabetes medicines. Possible conditions that may cause secondary hyperhidrosis include: ?Diabetes. ?Gout. ?Anxiety. ?Obesity. ?Menopause. ?Overactive thyroid (hyperthyroidism). ?Tumors. ?Frostbite. ?Certain types of cancers. ?Alcoholism. ?Injury to your nervous system. ?Stroke. ?Parkinson's disease. ?What increases the risk? ?You are more likely to develop primary hyperhidrosis if you have a family history of the condition. ?What are the signs or symptoms? ?Symptoms of this condition include: ?Feeling like you are sweating constantly, even while you are not being active. ?Having skin that peels or gets paler or softer in the areas where you sweat the most. ?Being able to see sweat on your skin. ?Other symptoms depend on the kind of hyperhidrosis that you have. ?Symptoms of primary hyperhidrosis may include: ?Sweating in the same location on both sides of your body. ?Sweating only during the day and not while you are  sleeping. ?Sweating in specific areas, such as your underarms, palms, feet, and face. ?Symptoms of secondary hyperhidrosis may include: ?Sweating all over your body. ?Sweating even while you sleep. ?How is this diagnosed? ?This condition may be diagnosed by: ?Medical history. ?Physical exam. ?You may also have other tests, including: ?Tests to measure the amount of sweat you produce and to show the areas where you sweat the most. These tests may involve: ?Using color-changing chemicals to show patterns of sweating on the skin. ?Weighing paper that has been applied to the skin. This will show the amount of sweat that your body produces. ?Measuring the amount of water that evaporates from the skin. ?Using infrared technology to show patterns of sweating on the skin. ?Tests to check for other conditions that may be causing excess sweating. This may include blood, urine, or imaging tests. ?How is this treated? ?Treatment for this condition depends on the kind of hyperhidrosis that you have and the areas of your body that are affected. Your health care provider will also treat any underlying conditions. ?Treatment may include: ?Medicines, such as:  ?Antiperspirants. These are medicines that stop sweat. ?Injectable medicines. These may include small injections of botulinum toxin. ?Oral medicines. These are taken by mouth to treat underlying conditions and other symptoms. ?A procedure to: ?Temporarily turn off the sweat glands in your hands and feet (iontophoresis). ?Remove your sweat glands. ?Cut or destroy the nerves so that they do not send a signal to the sweat glands (sympathectomy). ?Follow these instructions at home: ?Lifestyle ? ?Limit or avoid foods or beverages that may increase your risk of sweating, such as: ?Spicy food. ?Caffeine. ?Alcohol. ?Foods that contain monosodium glutamate (MSG). ?If your feet sweat: ?Wear sandals when possible. ?  Do not wear cotton socks. Wear socks that remove or wick moisture from  your feet. ?Wear leather shoes. ?Avoid wearing the same pair of shoes for two days in a row. ?Try placing sweat pads under your clothes to prevent underarm sweat from showing. ?Keep a journal of your sweat symptoms and when they occur. This may help you identify things that trigger your sweating. ?General instructions ?Take over-the-counter and prescription medicines only as told by your health care provider. ?Use antiperspirants as told by your health care provider. ?Consider joining a hyperhidrosis support group. ?Keep all follow-up visits as told by your health care provider. This is important. ?Contact a health care provider if: ?You have new symptoms. ?Your symptoms get worse. ?Summary ?Hyperhidrosis is a condition in which the body sweats a lot more than normal (excessively). ?With primary hyperhidrosis, the sweating usually localizes in one part of your body, such as your underarms, or in a few areas, such as your feet, face, underarms, and hands. It is caused by overactive sweat glands in the affected area. ?With secondary hyperhidrosis, the sweating affects your entire body. This is caused by an underlying condition. ?Treatment for this condition depends on the kind of hyperhidrosis that you have and the parts of your body that are affected. ?This information is not intended to replace advice given to you by your health care provider. Make sure you discuss any questions you have with your health care provider. ?Document Revised: 01/10/2020 Document Reviewed: 01/11/2020 ?Elsevier Patient Education ? [place]. ? ?

## 2021-08-02 ENCOUNTER — Other Ambulatory Visit: Payer: Self-pay | Admitting: Family

## 2021-08-02 DIAGNOSIS — F331 Major depressive disorder, recurrent, moderate: Secondary | ICD-10-CM

## 2021-08-02 DIAGNOSIS — F411 Generalized anxiety disorder: Secondary | ICD-10-CM

## 2021-08-02 LAB — CMP14+EGFR
ALT: 22 IU/L (ref 0–32)
AST: 25 IU/L (ref 0–40)
Albumin/Globulin Ratio: 1.9 (ref 1.2–2.2)
Albumin: 4.7 g/dL (ref 3.7–4.7)
Alkaline Phosphatase: 59 IU/L (ref 44–121)
BUN/Creatinine Ratio: 18 (ref 12–28)
BUN: 17 mg/dL (ref 8–27)
Bilirubin Total: 0.3 mg/dL (ref 0.0–1.2)
CO2: 21 mmol/L (ref 20–29)
Calcium: 10.4 mg/dL — ABNORMAL HIGH (ref 8.7–10.3)
Chloride: 98 mmol/L (ref 96–106)
Creatinine, Ser: 0.95 mg/dL (ref 0.57–1.00)
Globulin, Total: 2.5 g/dL (ref 1.5–4.5)
Glucose: 227 mg/dL — ABNORMAL HIGH (ref 70–99)
Potassium: 4.7 mmol/L (ref 3.5–5.2)
Sodium: 139 mmol/L (ref 134–144)
Total Protein: 7.2 g/dL (ref 6.0–8.5)
eGFR: 63 mL/min/{1.73_m2} (ref >59)

## 2021-08-02 LAB — CBC WITH DIFFERENTIAL/PLATELET
Basophils Absolute: 0 10*3/uL (ref 0.0–0.2)
Basos: 1 %
EOS (ABSOLUTE): 0.2 10*3/uL (ref 0.0–0.4)
Eos: 2 %
Hematocrit: 39.1 % (ref 34.0–46.6)
Hemoglobin: 13.2 g/dL (ref 11.1–15.9)
Immature Grans (Abs): 0 10*3/uL (ref 0.0–0.1)
Immature Granulocytes: 0 %
Lymphocytes Absolute: 2.3 10*3/uL (ref 0.7–3.1)
Lymphs: 29 %
MCH: 31.8 pg (ref 26.6–33.0)
MCHC: 33.8 g/dL (ref 31.5–35.7)
MCV: 94 fL (ref 79–97)
Monocytes Absolute: 0.6 10*3/uL (ref 0.1–0.9)
Monocytes: 8 %
Neutrophils Absolute: 4.7 10*3/uL (ref 1.4–7.0)
Neutrophils: 60 %
Platelets: 190 10*3/uL (ref 150–450)
RBC: 4.15 x10E6/uL (ref 3.77–5.28)
RDW: 12.8 % (ref 11.7–15.4)
WBC: 7.8 10*3/uL (ref 3.4–10.8)

## 2021-08-02 LAB — MICROALBUMIN / CREATININE URINE RATIO
Creatinine, Urine: 55.1 mg/dL
Microalb/Creat Ratio: 7 mg/g creat (ref 0–29)
Microalbumin, Urine: 4.1 ug/mL

## 2021-08-06 MED ORDER — EMPAGLIFLOZIN 10 MG PO TABS: 10.0000 mg | ORAL_TABLET | Freq: Every day | ORAL | 1 refills | Status: DC

## 2021-08-06 NOTE — Addendum Note (Signed)
Addended by: Evelina Dun A on: 08/06/2021 03:00 PM ? ? Modules accepted: Orders ? ?

## 2021-08-08 ENCOUNTER — Other Ambulatory Visit: Payer: Self-pay | Admitting: Family

## 2021-08-14 DIAGNOSIS — M5136 Other intervertebral disc degeneration, lumbar region: Secondary | ICD-10-CM | POA: Diagnosis not present

## 2021-08-14 DIAGNOSIS — M545 Low back pain, unspecified: Secondary | ICD-10-CM | POA: Diagnosis not present

## 2021-08-14 DIAGNOSIS — G894 Chronic pain syndrome: Secondary | ICD-10-CM | POA: Diagnosis not present

## 2021-08-14 DIAGNOSIS — M5416 Radiculopathy, lumbar region: Secondary | ICD-10-CM | POA: Diagnosis not present

## 2021-09-02 ENCOUNTER — Other Ambulatory Visit: Payer: Self-pay | Admitting: Family

## 2021-09-02 DIAGNOSIS — I10 Essential (primary) hypertension: Secondary | ICD-10-CM

## 2021-09-02 DIAGNOSIS — F411 Generalized anxiety disorder: Secondary | ICD-10-CM

## 2021-09-02 DIAGNOSIS — E785 Hyperlipidemia, unspecified: Secondary | ICD-10-CM

## 2021-09-02 DIAGNOSIS — F331 Major depressive disorder, recurrent, moderate: Secondary | ICD-10-CM

## 2021-09-13 ENCOUNTER — Ambulatory Visit: Payer: Medicare Other

## 2021-09-18 ENCOUNTER — Ambulatory Visit: Payer: Medicare Other

## 2021-09-18 DIAGNOSIS — G894 Chronic pain syndrome: Secondary | ICD-10-CM | POA: Diagnosis not present

## 2021-09-18 DIAGNOSIS — M5416 Radiculopathy, lumbar region: Secondary | ICD-10-CM | POA: Diagnosis not present

## 2021-09-18 DIAGNOSIS — M5136 Other intervertebral disc degeneration, lumbar region: Secondary | ICD-10-CM | POA: Diagnosis not present

## 2021-10-16 DIAGNOSIS — G894 Chronic pain syndrome: Secondary | ICD-10-CM | POA: Diagnosis not present

## 2021-10-16 DIAGNOSIS — M5416 Radiculopathy, lumbar region: Secondary | ICD-10-CM | POA: Diagnosis not present

## 2021-10-16 DIAGNOSIS — M5136 Other intervertebral disc degeneration, lumbar region: Secondary | ICD-10-CM | POA: Diagnosis not present

## 2021-10-22 DIAGNOSIS — M5451 Vertebrogenic low back pain: Secondary | ICD-10-CM | POA: Diagnosis not present

## 2021-10-22 DIAGNOSIS — M5459 Other low back pain: Secondary | ICD-10-CM | POA: Diagnosis not present

## 2021-10-29 ENCOUNTER — Telehealth: Payer: Medicare Other

## 2021-10-30 DIAGNOSIS — M5451 Vertebrogenic low back pain: Secondary | ICD-10-CM | POA: Diagnosis not present

## 2021-11-06 ENCOUNTER — Ambulatory Visit: Payer: Medicare Other | Admitting: Pharmacist

## 2021-11-06 DIAGNOSIS — E1165 Type 2 diabetes mellitus with hyperglycemia: Secondary | ICD-10-CM

## 2021-11-06 DIAGNOSIS — N1831 Chronic kidney disease, stage 3a: Secondary | ICD-10-CM

## 2021-11-06 NOTE — Progress Notes (Signed)
Chronic Care Management Pharmacy Note  11/06/2021 Name:  Sonya Cook MRN:  [id number] DOB:  [birth year]  Summary:  Diabetes: Controlled--a1c slightly up, blood sugars up to 160-180; current treatment: victoza 1.66m daily-->increase to 1.855mdaily Discontinued jardiance (patient reported she felt bad on medication-weak, couldn't get out of bed)) Discontinued ozempic due to weakness [place]NR[place] to 1.53m553meekly  Denies personal and family history of Medullary thyroid cancer (MTC) Current glucose readings: fasting glucose: <150, post prandial glucose: N/A Denies hypoglycemic/hyperglycemic symptoms Discussed meal planning options and Plate method for healthy eating Avoid sugary drinks and desserts Incorporate balanced protein, non starchy veggies, 1 serving of carbohydrate with each meal Increase water intake Increase physical activity as able Current exercise: N/A Educated on ABOVE CHANGES, GLP1 Assessed patient finances. ENR[place]   Patient Goals/Self-Care Activities Over the next 90 days, patient will:  - take medications as prescribed check glucose 3X WEEKLY, document, and provide at future appointments collaborate with provider on medication access solutions  Follow Up Plan: Telephone follow up appointment with care management team member scheduled for: 3 MONTHS   Subjective: Sonya Cook an [age]o. year old female who is a primary patient of HawSharion BalloonNP.  The CCM team was consulted for assistance with disease management and care coordination needs.    Engaged with patient by telephone for follow up visit in response to provider referral for pharmacy case management and/or care coordination services.   Consent to Services:  The patient was given information about Chronic Care Management services, agreed to services, and gave verbal consent prior to  initiation of services.  Please see initial visit note for detailed documentation.   Patient Care Team: HawSharion BalloonNP as PCP - [facility] ([facility], [facility]) PruLavera GuisePH[facility]) CriMohammed KindleD as Referring Physician (Pain Medicine) Le,Harlen LabsD as Referring Physician (Optometry)  Objective:  Lab Results  Component Value Date   CREATININE 0.95 08/01/2021   CREATININE 1.22 (H) 05/07/2021   CREATININE 0.93 05/31/2020    Lab Results  Component Value Date   HGBA1C 7.5 (H) 08/01/2021   Last diabetic Eye exam:  Lab Results  Component Value Date/Time   HMDIABEYEEXA No Retinopathy 09/06/2018 12:00 AM    Last diabetic Foot exam: No results found for: "HMDIABFOOTEX"      Component Value Date/Time   CHOL 176 05/07/2021 0851   CHOL 140 08/30/2012 1252   TRIG 212 (H) 05/07/2021 0851   TRIG 316 (H) 08/18/2013 0922   TRIG 330 (H) 08/30/2012 1252   HDL 47 05/07/2021 0851   HDL 32 (L) 08/18/2013 0922   HDL 33 (L) 08/30/2012 1252   CHOLHDL 3.7 05/07/2021 0851   LDLCALC 93 05/07/2021 0851   LDLCALC 126 (H) 08/18/2013 0922   LDLCALC 41 08/30/2012 1252       Latest Ref Rng & Units 08/01/2021    9:56 AM 05/07/2021    8:51 AM 05/31/2020   12:58 PM  Hepatic Function  Total Protein 6.0 - 8.5 g/dL 7.2  7.0  6.9   Albumin 3.7 - 4.7 g/dL 4.7  4.6  4.3   AST 0 - 40 IU/L 25  26  31    ALT 0 - 32 IU/L 22  20  27    Alk Phosphatase 44 - 121 IU/L 59  64  72   Total Bilirubin 0.0 -  1.2 mg/dL 0.3  0.4  0.4     Lab Results  Component Value Date/Time   TSH 2.640 05/07/2021 08:51 AM   TSH 1.340 11/25/2018 08:15 AM       Latest Ref Rng & Units 08/01/2021    9:56 AM 05/07/2021    8:51 AM 05/31/2020   12:58 PM  CBC  WBC 3.4 - 10.8 x10E3/uL 7.8  8.1  8.1   Hemoglobin 11.1 - 15.9 g/dL 13.2  14.3  13.7   Hematocrit 34.0 - 46.6 % 39.1  42.1  39.4   Platelets 150 - 450 x10E3/uL 190  193  199     Lab Results  Component Value  Date/Time   VD25OH 43 09/30/2012 03:37 PM    Clinical ASCVD: No  The 10-year ASCVD risk score (Arnett DK, et al., 2019) is: 31.4%   Values used to calculate the score:     Age: 63 years     Sex: Female     Is Non-Hispanic African American: No     Diabetic: Yes     Tobacco smoker: No     Systolic Blood Pressure: 500 mmHg     Is BP treated: Yes     HDL Cholesterol: 47 mg/dL     Total Cholesterol: 176 mg/dL    Other: (CHADS2VASc if Afib, PHQ9 if depression, MMRC or CAT for COPD, ACT, DEXA)  Social History   Tobacco Use  Smoking Status Former   Types: Cigarettes   Start date: 04/22/2000  Smokeless Tobacco Never   BP Readings from Last 3 Encounters:  08/01/21 117/75  05/07/21 115/75  09/11/20 (!) 151/62   Pulse Readings from Last 3 Encounters:  08/01/21 (!) 59  05/07/21 78  09/11/20 75   Wt Readings from Last 3 Encounters:  08/01/21 158 lb 6.4 oz (71.8 kg)  05/07/21 152 lb (68.9 kg)  09/12/20 150 lb (68 kg)    Assessment: Review of patient past medical history, allergies, medications, health status, including review of consultants reports, laboratory and other test data, was performed as part of comprehensive evaluation and provision of chronic care management services.   SDOH:  (Social Determinants of Health) assessments and interventions performed:    CCM Care Plan  Allergies  Allergen Reactions   Codeine     REACTION: hives   Ibuprofen    Penicillins Hives    Has patient had a PCN reaction causing immediate rash, facial/tongue/throat swelling, SOB or lightheadedness with hypotension: Yes Has patient had a PCN reaction causing severe rash involving mucus membranes or skin necrosis: No Has patient had a PCN reaction that required hospitalization: No Has patient had a PCN reaction occurring within the last 10 years: No If all of the above answers are "NO", then may proceed with Cephalosporin use.    Jardiance [Empagliflozin]     Felt bad, couldn't get out of  bed    Medications Reviewed Today     Reviewed by Sharion Balloon, FNP (Family Nurse Practitioner) on 08/01/21 at [phone]  Med List Status: <None>   Medication Order Taking? Sig Documenting Provider Last Dose Status Informant  atorvastatin (LIPITOR) 40 MG tablet [id number] Yes TAKE ONE (1) TABLET EACH DAY Hawks, [place] A, FNP Taking Active   busPIRone (BUSPAR) 7.5 MG tablet [id number] Yes Take 1 tablet (7.5 mg total) by mouth 3 (three) times daily. Evelina Dun A, FNP Taking Active   DULoxetine (CYMBALTA) 60 MG capsule [id number] Yes TAKE ONE (1) CAPSULE EACH DAY Hawks, Theador [place], FNP  Taking Active   fentaNYL (DURAGESIC) 50 MCG/HR [id number] Yes Place 75 mcg/hr onto the skin every other day.  [provider] Taking Active   liraglutide (VICTOZA) 18 MG/3ML SOPN [id number] Yes Inject 1.8 mg into the skin daily. Sharion Balloon, FNP Taking Active   lisinopril (ZESTRIL) 10 MG tablet [id number] Yes TAKE ONE (1) TABLET EACH DAY Hawks, [place] A, FNP Taking Active   NARCAN 4 MG/0.1ML LIQD nasal spray kit [id number] Yes  [provider] Taking Active   Omega-3 Fatty Acids (FISH OIL MAXIMUM STRENGTH) 1200 MG CAPS [id number] Yes Take 3,600 mg by mouth 2 (two) times daily.  [provider] Taking Active Self  omeprazole-sodium bicarbonate (ZEGERID) 40-1100 MG capsule [id number] Yes Take 1 capsule by mouth 2 (two) times daily.  [provider] Taking Active Self  ondansetron (ZOFRAN) 4 MG tablet [id number] Yes TAKE 1 TABLET EVERY 8 HOURS AS NEEDED FOR NAUSEA AND VOMITING Sharion Balloon, FNP Taking Active   UNIFINE PENTIPS 31G X 6 MM [place] [id number] Yes  [provider] Taking Active Self           Med Note Chapman Fitch, ASHLEY   Thu Aug 18, 2013  9:30 AM) Received from: External Pharmacy            Patient Active Problem List   Diagnosis Date Noted   Hypertension    Overweight (BMI 25.0-29.9) 01/07/2018   Rotator cuff tear, right 04/30/2017   Aortic atherosclerosis  ([place]) 04/09/2017   Chronic back pain 11/13/2016   Stage 3 chronic kidney disease ([place]) 08/20/2015   DDD (degenerative disc disease), lumbar 08/20/2015   GERD (gastroesophageal reflux disease) 11/02/2012   Depression 08/30/2012   Osteopenia 08/30/2012   Diabetes ([place]) 08/30/2012   Hyperlipidemia 08/30/2012    Immunization History  Administered Date(s) Administered   Influenza Split 03/05/2017   Influenza, High Dose Seasonal PF 12/18/2015, 01/07/2018, 01/22/2019   Influenza, Seasonal, Injecte, Preservative Fre 12/22/2013, 02/10/2015   Influenza,inj,quad, With Preservative 02/28/2017   Influenza-Unspecified 11/29/2013, 02/10/2015, 02/28/2021   Moderna Sars-Covid-2 Vaccination 08/21/2019, 09/19/2019   Pneumococcal Conjugate-13 06/17/2016   Pneumococcal Polysaccharide-23 03/07/2013   Pneumococcal-Unspecified 08/30/2015   Tdap 09/05/2018   Zoster Recombinat (Shingrix) 05/07/2021, 08/01/2021   Zoster, Live 08/18/2013    Conditions to be addressed/monitored: DMII  Care Plan : PHARMD MEDICATION MANAGEMENT  Updates made by Lavera Guise, RPH since 11/08/2021 12:00 AM     Problem: DISEASE PROGRESSION PREVENTION      Long-Range Goal: T2DM   Recent Progress: On track  Priority: High  Note:   Current Barriers:  Unable to independently afford treatment regimen Suboptimal therapeutic regimen for T2DM  Pharmacist Clinical Goal(s):  Over the next 90 days, patient will verbalize ability to afford treatment regimen adhere to plan to optimize therapeutic regimen for T2DM as evidenced by report of adherence to recommended medication management changes through collaboration with PharmD and provider.   Interventions: 1:1 collaboration with Sharion Balloon, FNP regarding development and update of comprehensive plan of care as evidenced by provider attestation and co-signature Inter-disciplinary care team collaboration (see longitudinal plan of care) Comprehensive medication review  performed; medication list updated in electronic medical record  Diabetes: Controlled--a1c slightly up, blood sugars up to 160-180; current treatment: victoza 1.21m daily-->increase to 1.886mdaily Discontinued jardiance (patient reported she felt bad on medication-weak, couldn't get out of bed)) Discontinued ozempic due to weakness [place] vict[place]za to 1.71m85meekly  Denies personal and family  history of Medullary thyroid cancer (MTC) Current glucose readings: fasting glucose: <150, post prandial glucose: N/A Denies hypoglycemic/hyperglycemic symptoms Discussed meal planning options and Plate method for healthy eating Avoid sugary drinks and desserts Incorporate balanced protein, non starchy veggies, 1 serving of carbohydrate with each meal Increase water intake Increase physical activity as able Current exercise: N/A Educated on ABOVE CHANGES, GLP1 Assessed patient finances. ENROLLMENT FOR NOVO [place] PATIENT ASSISTANCE     Patient Goals/Self-Care Activities Over the next 90 days, patient will:  - take medications as prescribed check glucose 3X WEEKLY, document, and provide at future appointments collaborate with provider on medication access solutions  Follow Up Plan: Telephone follow up appointment with care management team member scheduled for: 3 MONTHS      Follow Up:  Patient agrees to Care Plan and Follow-up.  Plan: Telephone follow up appointment with care management team member scheduled for:  3 months   Regina Eck, PharmD, BCPS Clinical Pharmacist, [place]  II Phone [phone]

## 2021-11-08 DIAGNOSIS — M10041 Idiopathic gout, right hand: Secondary | ICD-10-CM | POA: Diagnosis not present

## 2021-11-08 DIAGNOSIS — D2111 Benign neoplasm of connective and other soft tissue of right upper limb, including shoulder: Secondary | ICD-10-CM | POA: Diagnosis not present

## 2021-11-08 DIAGNOSIS — R2231 Localized swelling, mass and lump, right upper limb: Secondary | ICD-10-CM | POA: Diagnosis not present

## 2021-11-08 NOTE — Patient Instructions (Signed)
Visit Information  Following are the goals we discussed today:  Current Barriers:  Unable to independently afford treatment regimen Suboptimal therapeutic regimen for T2DM  Pharmacist Clinical Goal(s):  Over the next 90 days, patient will verbalize ability to afford treatment regimen adhere to plan to optimize therapeutic regimen for T2DM as evidenced by report of adherence to recommended medication management changes through collaboration with PharmD and provider.   Interventions: 1:1 collaboration with Sharion Balloon, FNP regarding development and update of comprehensive plan of care as evidenced by provider attestation and co-signature Inter-disciplinary care team collaboration (see longitudinal plan of care) Comprehensive medication review performed; medication list updated in electronic medical record  Diabetes: Controlled--a1c slightly up, blood sugars up to 160-180; current treatment: victoza 1.'2mg'$  daily-->increase to 1.'8mg'$  daily Discontinued jardiance (patient reported she felt bad on medication-weak, couldn't get out of bed)) Discontinued ozempic due to weakness ENROLLED PATIENT IN NOVO [place] Increased victoza to 1.'8mg'$  weekly  Denies personal and family history of Medullary thyroid cancer (MTC) Current glucose readings: fasting glucose: <150, post prandial glucose: N/A Denies hypoglycemic/hyperglycemic symptoms Discussed meal planning options and Plate method for healthy eating Avoid sugary drinks and desserts Incorporate balanced protein, non starchy veggies, 1 serving of carbohydrate with each meal Increase water intake Increase physical activity as able Current exercise: N/A Educated on [place], GLP1 Assessed patient finances. ENROLLMENT FOR NOVO [place] PATIENT ASSISTANCE    Patient Goals/Self-Care Activities Over the next 90 days, patient will:  - take medications as prescribed check glucose 3X WEEKLY, document, and provide at  future appointments collaborate with provider on medication access solutions  Follow Up Plan: Telephone follow up appointment with care management team member scheduled for: 3 MONTHS   Plan: Telephone follow up appointment with care management team member scheduled for:  3 months  Signature Regina Eck, PharmD, BCPS Clinical Pharmacist, [place]  II Phone [phone]   Please call the care guide team at [phone] if you need to cancel or reschedule your appointment.   The patient verbalized understanding of instructions, educational materials, and care plan provided today and DECLINED offer to receive copy of patient instructions, educational materials, and care plan.

## 2021-11-14 DIAGNOSIS — M5451 Vertebrogenic low back pain: Secondary | ICD-10-CM | POA: Diagnosis not present

## 2021-11-15 DIAGNOSIS — M25641 Stiffness of right hand, not elsewhere classified: Secondary | ICD-10-CM | POA: Diagnosis not present

## 2021-11-19 ENCOUNTER — Ambulatory Visit (INDEPENDENT_AMBULATORY_CARE_PROVIDER_SITE_OTHER): Payer: Medicare Other

## 2021-11-19 VITALS — Wt 148.0 lb

## 2021-11-19 DIAGNOSIS — M5416 Radiculopathy, lumbar region: Secondary | ICD-10-CM | POA: Diagnosis not present

## 2021-11-19 DIAGNOSIS — M5136 Other intervertebral disc degeneration, lumbar region: Secondary | ICD-10-CM | POA: Diagnosis not present

## 2021-11-19 DIAGNOSIS — Z0000 Encounter for general adult medical examination without abnormal findings: Secondary | ICD-10-CM

## 2021-11-19 DIAGNOSIS — G894 Chronic pain syndrome: Secondary | ICD-10-CM | POA: Diagnosis not present

## 2021-11-19 NOTE — Patient Instructions (Signed)
Sonya Cook , Thank you for taking time to come for your Medicare Wellness Visit. I appreciate your ongoing commitment to your health goals. Please review the following plan we discussed and let me know if I can assist you in the future.   Screening recommendations/referrals: Colonoscopy: Done 2011 - Discuss Cologuard and constipation issues with Christy at next visit Mammogram: Declined - recommended annually Bone Density: Done 10/06/2016 - Repeat every 2 years *due Recommended yearly ophthalmology/optometry visit for glaucoma screening and checkup Recommended yearly dental visit for hygiene and checkup  Vaccinations: Influenza vaccine: Done 02/28/2021 - Repeat annually  Pneumococcal vaccine: Done  03/07/2013, 08/30/2015, & 09/17/2016 Tdap vaccine: Done 09/05/2018 - Repeat in 10 years  Shingles vaccine: Done  05/07/2021 & 08/01/2021         Covid-19: Done 08/21/2019 & 09/19/2019  Advanced directives: Please bring a copy of your health care power of attorney and living will to the office to be added to your chart at your convenience.   Conditions/risks identified: Aim for 30 minutes of exercise or brisk walking, 6-8 glasses of water, and 5 servings of fruits and vegetables each day.   Next appointment: Follow up in one year for your annual wellness visit    Preventive Care 65 Years and Older, Female Preventive care refers to lifestyle choices and visits with your health care provider that can promote health and wellness. What does preventive care include? A yearly physical exam. This is also called an annual well check. Dental exams once or twice a year. Routine eye exams. Ask your health care provider how often you should have your eyes checked. Personal lifestyle choices, including: Daily care of your teeth and gums. Regular physical activity. Eating a healthy diet. Avoiding tobacco and drug use. Limiting alcohol use. Practicing safe sex. Taking low-dose aspirin every day. Taking vitamin and  mineral supplements as recommended by your health care provider. What happens during an annual well check? The services and screenings done by your health care provider during your annual well check will depend on your age, overall health, lifestyle risk factors, and family history of disease. Counseling  Your health care provider may ask you questions about your: Alcohol use. Tobacco use. Drug use. Emotional well-being. Home and relationship well-being. Sexual activity. Eating habits. History of falls. Memory and ability to understand (cognition). Work and work Statistician. Reproductive health. Screening  You may have the following tests or measurements: Height, weight, and BMI. Blood pressure. Lipid and cholesterol levels. These may be checked every 5 years, or more frequently if you are over 23 years old. Skin check. Lung cancer screening. You may have this screening every year starting at age 15 if you have a 30-pack-year history of smoking and currently smoke or have quit within the past 15 years. Fecal occult blood test (FOBT) of the stool. You may have this test every year starting at age 65. Flexible sigmoidoscopy or colonoscopy. You may have a sigmoidoscopy every 5 years or a colonoscopy every 10 years starting at age 31. Hepatitis C blood test. Hepatitis B blood test. Sexually transmitted disease (STD) testing. Diabetes screening. This is done by checking your blood sugar (glucose) after you have not eaten for a while (fasting). You may have this done every 1-3 years. Bone density scan. This is done to screen for osteoporosis. You may have this done starting at age 24. Mammogram. This may be done every 1-2 years. Talk to your health care provider about how often you should have regular  mammograms. Talk with your health care provider about your test results, treatment options, and if necessary, the need for more tests. Vaccines  Your health care provider may recommend  certain vaccines, such as: Influenza vaccine. This is recommended every year. Tetanus, diphtheria, and acellular pertussis (Tdap, Td) vaccine. You may need a Td booster every 10 years. Zoster vaccine. You may need this after age 29. Pneumococcal 13-valent conjugate (PCV13) vaccine. One dose is recommended after age 53. Pneumococcal polysaccharide (PPSV23) vaccine. One dose is recommended after age 65. Talk to your health care provider about which screenings and vaccines you need and how often you need them. This information is not intended to replace advice given to you by your health care provider. Make sure you discuss any questions you have with your health care provider. Document Released: 04/13/2015 Document Revised: 12/05/2015 Document Reviewed: 01/16/2015 Elsevier Interactive Patient Education  2017 [place] Prevention in the Home Falls can cause injuries. They can happen to people of all ages. There are many things you can do to make your home safe and to help prevent falls. What can I do on the outside of my home? Regularly fix the edges of walkways and driveways and fix any cracks. Remove anything that might make you trip as you walk through a door, such as a raised step or threshold. Trim any bushes or trees on the path to your home. Use bright outdoor lighting. Clear any walking paths of anything that might make someone trip, such as rocks or tools. Regularly check to see if handrails are loose or broken. Make sure that both sides of any steps have handrails. Any raised decks and porches should have guardrails on the edges. Have any leaves, snow, or ice cleared regularly. Use sand or salt on walking paths during winter. Clean up any spills in your garage right away. This includes oil or grease spills. What can I do in the bathroom? Use night lights. Install grab bars by the toilet and in the tub and shower. Do not use towel bars as grab bars. Use non-skid mats or  decals in the tub or shower. If you need to sit down in the shower, use a plastic, non-slip stool. Keep the floor dry. Clean up any water that spills on the floor as soon as it happens. Remove soap buildup in the tub or shower regularly. Attach bath mats securely with double-sided non-slip rug tape. Do not have throw rugs and other things on the floor that can make you trip. What can I do in the bedroom? Use night lights. Make sure that you have a light by your bed that is easy to reach. Do not use any sheets or blankets that are too big for your bed. They should not hang down onto the floor. Have a firm chair that has side arms. You can use this for support while you get dressed. Do not have throw rugs and other things on the floor that can make you trip. What can I do in the kitchen? Clean up any spills right away. Avoid walking on wet floors. Keep items that you use a lot in easy-to-reach places. If you need to reach something above you, use a strong step stool that has a grab bar. Keep electrical cords out of the way. Do not use floor polish or wax that makes floors slippery. If you must use wax, use non-skid floor wax. Do not have throw rugs and other things on the floor that can  make you trip. What can I do with my stairs? Do not leave any items on the stairs. Make sure that there are handrails on both sides of the stairs and use them. Fix handrails that are broken or loose. Make sure that handrails are as long as the stairways. Check any carpeting to make sure that it is firmly attached to the stairs. Fix any carpet that is loose or worn. Avoid having throw rugs at the top or bottom of the stairs. If you do have throw rugs, attach them to the floor with carpet tape. Make sure that you have a light switch at the top of the stairs and the bottom of the stairs. If you do not have them, ask someone to add them for you. What else can I do to help prevent falls? Wear shoes that: Do not  have high heels. Have rubber bottoms. Are comfortable and fit you well. Are closed at the toe. Do not wear sandals. If you use a stepladder: Make sure that it is fully opened. Do not climb a closed stepladder. Make sure that both sides of the stepladder are locked into place. Ask someone to hold it for you, if possible. Clearly mark and make sure that you can see: Any grab bars or handrails. First and last steps. Where the edge of each step is. Use tools that help you move around (mobility aids) if they are needed. These include: Canes. Walkers. Scooters. Crutches. Turn on the lights when you go into a dark area. Replace any light bulbs as soon as they burn out. Set up your furniture so you have a clear path. Avoid moving your furniture around. If any of your floors are uneven, fix them. If there are any pets around you, be aware of where they are. Review your medicines with your doctor. Some medicines can make you feel dizzy. This can increase your chance of falling. Ask your doctor what other things that you can do to help prevent falls. This information is not intended to replace advice given to you by your health care provider. Make sure you discuss any questions you have with your health care provider. Document Released: 01/11/2009 Document Revised: 08/23/2015 Document Reviewed: 04/21/2014 Elsevier Interactive Patient Education  2017 [employer]   Managing Pain Without Opioids Opioids are strong medicines used to treat moderate to severe pain. For some people, especially those who have long-term (chronic) pain, opioids may not be the best choice for pain management due to: Side effects like nausea, constipation, and sleepiness. The risk of addiction (opioid use disorder). The longer you take opioids, the greater your risk of addiction. Pain that lasts for more than 3 months is called chronic pain. Managing chronic pain usually requires more than one approach and is often  provided by a team of health care providers working together (multidisciplinary approach). Pain management may be done at a pain management center or pain clinic. How to manage pain without the use of opioids Use non-opioid medicines Non-opioid medicines for pain may include: Over-the-counter or prescription non-steroidal anti-inflammatory drugs (NSAIDs). These may be the first medicines used for pain. They work well for muscle and bone pain, and they reduce swelling. Acetaminophen. This over-the-counter medicine may work well for milder pain but not swelling. Antidepressants. These may be used to treat chronic pain. A certain type of antidepressant (tricyclics) is often used. These medicines are given in lower doses for pain than when used for depression. Anticonvulsants. These are usually used to treat  seizures but may also reduce nerve (neuropathic) pain. Muscle relaxants. These relieve pain caused by sudden muscle tightening (spasms). You may also use a pain medicine that is applied to the skin as a patch, cream, or gel (topical analgesic), such as a numbing medicine. These may cause fewer side effects than medicines taken by mouth. Do certain therapies as directed Some therapies can help with pain management. They include: Physical therapy. You will do exercises to gain strength and flexibility. A physical therapist may teach you exercises to move and stretch parts of your body that are weak, stiff, or painful. You can learn these exercises at physical therapy visits and practice them at home. Physical therapy may also involve: Massage. Heat wraps or applying heat or cold to affected areas. Electrical signals that interrupt pain signals (transcutaneous electrical nerve stimulation, TENS). Weak lasers that reduce pain and swelling (low-level laser therapy). Signals from your body that help you learn to regulate pain (biofeedback). Occupational therapy. This helps you to learn ways to function  at home and work with less pain. Recreational therapy. This involves trying new activities or hobbies, such as a physical activity or drawing. Mental health therapy, including: Cognitive behavioral therapy (CBT). This helps you learn coping skills for dealing with pain. Acceptance and commitment therapy (ACT) to change the way you think and react to pain. Relaxation therapies, including muscle relaxation exercises and mindfulness-based stress reduction. Pain management counseling. This may be individual, family, or group counseling.  Receive medical treatments Medical treatments for pain management include: Nerve block injections. These may include a pain blocker and anti-inflammatory medicines. You may have injections: Near the spine to relieve chronic back or neck pain. Into joints to relieve back or joint pain. Into nerve areas that supply a painful area to relieve body pain. Into muscles (trigger point injections) to relieve some painful muscle conditions. A medical device placed near your spine to help block pain signals and relieve nerve pain or chronic back pain (spinal cord stimulation device). Acupuncture. Follow these instructions at home Medicines Take over-the-counter and prescription medicines only as told by your health care provider. If you are taking pain medicine, ask your health care providers about possible side effects to watch out for. Do not drive or use heavy machinery while taking prescription opioid pain medicine. Lifestyle  Do not use drugs or alcohol to reduce pain. If you drink alcohol, limit how much you have to: 0-1 drink a day for women who are not pregnant. 0-2 drinks a day for men. Know how much alcohol is in a drink. In the [place] one drink equals one 12 oz bottle of beer (355 mL), one 5 oz glass of wine (148 mL), or one 1 oz glass of hard liquor (44 mL). Do not use any products that contain nicotine or tobacco. These products include cigarettes, chewing  tobacco, and vaping devices, such as e-cigarettes. If you need help quitting, ask your health care provider. Eat a healthy diet and maintain a healthy weight. Poor diet and excess weight may make pain worse. Eat foods that are high in fiber. These include fresh fruits and vegetables, whole grains, and beans. Limit foods that are high in fat and processed sugars, such as fried and sweet foods. Exercise regularly. Exercise lowers stress and may help relieve pain. Ask your health care provider what activities and exercises are safe for you. If your health care provider approves, join an exercise class that combines movement and stress reduction. Examples include yoga and  tai chi. Get enough sleep. Lack of sleep may make pain worse. Lower stress as much as possible. Practice stress reduction techniques as told by your therapist. General instructions Work with all your pain management providers to find the treatments that work best for you. You are an important member of your pain management team. There are many things you can do to reduce pain on your own. Consider joining an online or in-person support group for people who have chronic pain. Keep all follow-up visits. This is important. Where to find more information You can find more information about managing pain without opioids from: American Academy of Pain Medicine: painmed.[place] for Chronic Pain: instituteforchronicpain.org American Chronic Pain Association: theacpa.org Contact a health care provider if: You have side effects from pain medicine. Your pain gets worse or does not get better with treatments or home therapy. You are struggling with anxiety or depression. Summary Many types of pain can be managed without opioids. Chronic pain may respond better to pain management without opioids. Pain is best managed when you and a team of health care providers work together. Pain management without opioids may include non-opioid  medicines, medical treatments, physical therapy, mental health therapy, and lifestyle changes. Tell your health care providers if your pain gets worse or is not being managed well enough. This information is not intended to replace advice given to you by your health care provider. Make sure you discuss any questions you have with your health care provider. Document Revised: 06/27/2020 Document Reviewed: 06/27/2020 Elsevier Patient Education  [place].

## 2021-11-19 NOTE — Progress Notes (Signed)
Subjective:   Sonya Cook is a 76 y.o. female who presents for Medicare Annual (Subsequent) preventive examination.  Virtual Visit via Telephone Note  I connected with  Sonya Cook on 11/19/21 at  9:45 AM EDT by telephone and verified that I am speaking with the correct person using two identifiers.  Location: Patient: Home Provider: [facility] Persons participating in the virtual visit: patient/Nurse Health Advisor   I discussed the limitations, risks, security and privacy concerns of performing an evaluation and management service by telephone and the availability of in person appointments. The patient expressed understanding and agreed to proceed.  Interactive audio and video telecommunications were attempted between this nurse and patient, however failed, due to patient having technical difficulties OR patient did not have access to video capability.  We continued and completed visit with audio only.  Some vital signs may be absent or patient reported.   Sonya Cook E Sonya Bobak, LPN   Review of Systems     Cardiac Risk Factors include: advanced age (>59mn, >>11women);diabetes mellitus;dyslipidemia;hypertension;sedentary lifestyle     Objective:    Today's Vitals   11/19/21 1006  Weight: 148 lb (67.1 kg)  PainSc: 4    Body mass index is 27.07 kg/m.     11/19/2021   10:18 AM 09/12/2020   10:02 AM 09/12/2019    9:34 AM 09/10/2018    9:48 AM 05/26/2017   11:19 AM 04/27/2017    1:41 PM 11/17/2014    7:11 AM  Advanced Directives  Does Patient Have a Medical Advance Directive? _0  Yes No  Type of AVisual merchandiserof AKramerLiving will   Does patient want to make changes to medical advance directive?      No - Patient declined   Copy of [place]?      No - copy requested   Would patient like information on creating a medical advance directive? No - Patient declined Yes (MAU/Ambulatory/Procedural Areas - Information  given) No - Patient declined Yes (MAU/Ambulatory/Procedural Areas - Information given) No - Patient declined      Current Medications (verified) Outpatient Encounter Medications as of 11/19/2021  Medication Sig   atorvastatin (LIPITOR) 40 MG tablet TAKE ONE (1) TABLET EACH DAY   DULoxetine (CYMBALTA) 60 MG capsule TAKE ONE (1) CAPSULE EACH DAY   fentaNYL (DURAGESIC) 75 MCG/HR Place onto the skin.   liraglutide (VICTOZA) 18 MG/3ML SOPN Inject 1.8 mg into the skin daily.   lisinopril (ZESTRIL) 10 MG tablet TAKE ONE (1) TABLET EACH DAY   Omega-3 Fatty Acids (FISH OIL MAXIMUM STRENGTH) 1200 MG CAPS Take 3,600 mg by mouth 2 (two) times daily.    omeprazole-sodium bicarbonate (ZEGERID) 40-1100 MG capsule Take 1 capsule by mouth 2 (two) times daily.    pregabalin (LYRICA) 25 MG capsule Take by mouth.   UNIFINE PENTIPS 31G X 6 MM MISC    busPIRone (BUSPAR) 7.5 MG tablet Take 1 tablet (7.5 mg total) by mouth 3 (three) times daily. (Patient not taking: Reported on 11/19/2021)   NARCAN 4 MG/0.1ML LIQD nasal spray kit  (Patient not taking: Reported on 11/19/2021)   ondansetron (ZOFRAN) 4 MG tablet TAKE 1 TABLET EVERY 8 HOURS AS NEEDED FOR NAUSEA AND VOMITING (Patient not taking: Reported on 11/19/2021)   [DISCONTINUED] fentaNYL (DURAGESIC) 50 MCG/HR Place 75 mcg/hr onto the skin every other day.    No facility-administered encounter medications on file as of 11/19/2021.  Allergies (verified) Codeine, Ibuprofen, Penicillins, and Jardiance [empagliflozin]   History: Past Medical History:  Diagnosis Date   Anxiety    Chronic kidney disease    Depression    Diabetes mellitus without complication ([place])    type II    Dyslipidemia    Family history of adverse reaction to anesthesia    daughter - slow to wake up    GERD (gastroesophageal reflux disease)    Hiatal hernia    Hyperlipidemia    Hypertension    Internal hemorrhoids    Osteopenia    PONV (postoperative nausea and vomiting)    Vitamin  D deficiency    Past Surgical History:  Procedure Laterality Date   ABDOMINAL HYSTERECTOMY     LUMBAR DISC SURGERY     right hand surgery for cyst      SHOULDER OPEN ROTATOR CUFF REPAIR Right 04/30/2017   Procedure: Right shoulder mini open rotator cuff repair;  Surgeon: Susa Day, MD;  Location: [facility];  Service: Orthopedics;  Laterality: Right;  Interscalene Block   Family History  Problem Relation Age of Onset   Hodgkin's lymphoma Mother    Cancer Father        Lungs   Colon cancer Neg Hx    Diabetes Neg Hx    Stomach cancer Neg Hx    Rectal cancer Neg Hx    Social History   Socioeconomic History   Marital status: Married    Spouse name: Sonya Cook   Number of children: 2   Years of education: Not on file   Highest education level: Some college, no degree  Occupational History   Occupation: Therapist, music in and out of country    Comment: she and her husband work from home  Tobacco Use   Smoking status: Former    Types: Cigarettes    Start date: 04/22/2000   Smokeless tobacco: Never  Vaping Use   Vaping Use: Never used  Substance and Sexual Activity   Alcohol use: Yes    Comment: occsional use   Drug use: No   Sexual activity: Not Currently  Other Topics Concern   Not on file  Social History Narrative   Lives home with husband and great granddaughter. Son lives 1 hour away   Social Determinants of Health   Financial Resource Strain: Low Risk  (11/19/2021)   Overall Financial Resource Strain (CARDIA)    Difficulty of Paying Living Expenses: Not hard at all  Food Insecurity: No Food Insecurity (11/19/2021)   Hunger Vital Sign    Worried About Running Out of Food in the Last Year: Never true    Ran Out of Food in the Last Year: Never true  Transportation Needs: No Transportation Needs (11/19/2021)   PRAPARE - Hydrologist (Medical): No    Lack of Transportation (Non-Medical): No  Physical Activity: Inactive  (11/19/2021)   Exercise Vital Sign    Days of Exercise per Week: 0 days    Minutes of Exercise per Session: 0 min  Stress: No Stress Concern Present (11/19/2021)   Lydia    Feeling of Stress : Only a little  Social Connections: Socially Integrated (11/19/2021)   Social Connection and Isolation Panel [NHANES]    Frequency of Communication with Friends and Family: More than three times a week    Frequency of Social Gatherings with Friends and Family: More than three times a week  Attends Religious Services: 1 to 4 times per year    Active Member of Clubs or Organizations: Yes    Attends Archivist Meetings: 1 to 4 times per year    Marital Status: Married    Tobacco Counseling Counseling given: Not Answered   Clinical Intake:  Pre-visit preparation completed: Yes  Pain : 0-10 Pain Score: 4  Pain Type: Chronic pain Pain Location: Back Pain Orientation: Lower Pain Radiating Towards: legs Pain Descriptors / Indicators: Aching Pain Onset: More than a month ago Pain Frequency: Intermittent     BMI - recorded: 27.07 Nutritional Status: BMI 25 -29 Overweight Nutritional Risks: None Diabetes: Yes CBG done?: No Did pt. bring in CBG monitor from home?: No  How often do you need to have someone help you when you read instructions, pamphlets, or other written materials from your doctor or pharmacy?: 1 - Never  Diabetic? Nutrition Risk Assessment:  Has the patient had any N/V/D within the last 2 months?  No  Does the patient have any non-healing wounds?  No  Has the patient had any unintentional weight loss or weight gain?  No   Diabetes:  Is the patient diabetic?  Yes  If diabetic, was a CBG obtained today?  No  Did the patient bring in their glucometer from home?  No  How often do you monitor your CBG's? BID.   Financial Strains and Diabetes Management:  Are you having any financial strains  with the device, your supplies or your medication? No .  Does the patient want to be seen by Chronic Care Management for management of their diabetes?  No  Would the patient like to be referred to a Nutritionist or for Diabetic Management?  No   Diabetic Exams:  Diabetic Eye Exam: Completed 09/06/2018. Overdue for diabetic eye exam. Pt has been advised about the importance in completing this exam.   Diabetic Foot Exam: Completed 07/05/2021. Pt has been advised about the importance in completing this exam.    Interpreter Needed?: No  Information entered by :: Amy Hopkins, LPN   Activities of Daily Living    11/19/2021   10:18 AM  In your present state of health, do you have any difficulty performing the following activities:  Hearing? 1  Comment Left ear - hearing aid doesn't help  Vision? 0  Difficulty concentrating or making decisions? 0  Walking or climbing stairs? 0  Dressing or bathing? 0  Doing errands, shopping? 0  Preparing Food and eating ? N  Using the Toilet? N  In the past six months, have you accidently leaked urine? N  Do you have problems with loss of bowel control? N  Managing your Medications? N  Managing your Finances? N  Housekeeping or managing your Housekeeping? N    Patient Care Team: Sharion Balloon, FNP as PCP - General (Family Medicine) Lavera Guise, [facility] (Pharmacist) Mohammed Kindle, MD as Referring Physician (Pain Medicine) Harlen Labs, MD as Referring Physician (Optometry) Susa Day, MD as Consulting Physician (Orthopedic Surgery) Sater, Nanine Means, MD (Neurology)  Indicate any recent Medical Services you may have received from other than [facility] providers in the past year (date may be approximate).     Assessment:   This is a routine wellness examination for [place].  Hearing/Vision screen Hearing Screening - Comments:: C/o hearing difficulty in Left ear - hearing aids don't help Vision Screening - Comments:: Wears reading glasses prn -  up to date with routine eye exams  with [facility]  Dietary issues and exercise activities discussed: Current Exercise Habits: The patient does not participate in regular exercise at present, Exercise limited by: orthopedic condition(s)   Goals Addressed             This Visit's Progress    Patient Stated       Hopes her legs get better so she can walk some every day and feel better       Depression Screen    11/19/2021   10:26 AM 08/01/2021    9:30 AM 05/07/2021    9:05 AM 09/12/2020   10:00 AM 09/11/2020    9:18 AM 09/12/2019    9:26 AM 09/05/2019   10:49 AM  PHQ 2/9 Scores  PHQ - 2 Score 0 0 0 _0 PHQ- 9 Score  _1 Fall Risk    11/19/2021   10:11 AM 08/01/2021    9:30 AM 05/07/2021    9:04 AM 09/12/2020   10:06 AM 09/11/2020    9:18 AM  Fall Risk   Falls in the past year? 0 0 0 0 0  Number falls in past yr: 0   0   Injury with Fall? 0   0   Risk for fall due to : Orthopedic patient   Medication side effect;Orthopedic patient   Follow up Falls prevention discussed Falls evaluation completed  Falls prevention discussed     FALL RISK PREVENTION PERTAINING TO THE HOME:  Any stairs in or around the home? No  If so, are there any without handrails? No  Home free of loose throw rugs in walkways, pet beds, electrical cords, etc? Yes  Adequate lighting in your home to reduce risk of falls? Yes   ASSISTIVE DEVICES UTILIZED TO PREVENT FALLS:  Life alert? No  Use of a cane, walker or w/c? No  Grab bars in the bathroom? Yes  Shower chair or bench in shower? No  Elevated toilet seat or a handicapped toilet? No   TIMED UP AND GO:  Was the test performed? No . Telephonic visit  Cognitive Function:        11/19/2021   10:20 AM 09/12/2019    9:39 AM 09/10/2018    9:48 AM  6CIT Screen  What Year? 0 points 0 points 0 points  What month? 0 points 0 points 0 points  What time? 0 points 0 points 0 points  Count back from 20 0 points 0 points 0  points  Months in reverse 0 points 0 points 0 points  Repeat phrase 4 points 2 points 0 points  Total Score 4 points 2 points 0 points    Immunizations Immunization History  Administered Date(s) Administered   Influenza Split 03/05/2017   Influenza, High Dose Seasonal PF 12/18/2015, 01/07/2018, 01/22/2019   Influenza, Seasonal, Injecte, Preservative Fre 12/22/2013, 02/10/2015   Influenza,inj,quad, With Preservative 02/28/2017   Influenza-Unspecified 11/29/2013, 02/10/2015, 02/28/2021   Moderna Sars-Covid-2 Vaccination 08/21/2019, 09/19/2019   Pneumococcal Conjugate-13 06/17/2016   Pneumococcal Polysaccharide-23 03/07/2013   Pneumococcal-Unspecified 08/30/2015   Tdap 09/05/2018   Zoster Recombinat (Shingrix) 05/07/2021, 08/01/2021   Zoster, Live 08/18/2013    TDAP status: Up to date  Flu Vaccine status: Up to date  Pneumococcal vaccine status: Up to date  Covid-19 vaccine status: Completed vaccines  Qualifies for Shingles Vaccine? Yes   Zostavax completed Yes   Shingrix Completed?: Yes  Screening Tests Health Maintenance  Topic Date  Due   Fecal DNA (Cologuard)  Never done   OPHTHALMOLOGY EXAM  09/06/2019   COVID-19 Vaccine (3 - Moderna series) 11/14/2019   INFLUENZA VACCINE  10/29/2021   DEXA SCAN  02/27/2022 (Originally 10/07/2018)   HEMOGLOBIN A1C  02/01/2022   FOOT EXAM  05/07/2022   TETANUS/TDAP  09/04/2028   Pneumonia Vaccine 16+ Years old  Completed   Hepatitis C Screening  Completed   Zoster Vaccines- Shingrix  Completed   HPV VACCINES  Aged Out   MAMMOGRAM  Discontinued   COLONOSCOPY (Pts 45-82yr Insurance coverage will need to be confirmed)  Discontinued    Health Maintenance  Health Maintenance Due  Topic Date Due   Fecal DNA (Cologuard)  Never done   OPHTHALMOLOGY EXAM  09/06/2019   COVID-19 Vaccine (3 - Moderna series) 11/14/2019   INFLUENZA VACCINE  10/29/2021    Colorectal cancer screening: Type of screening: Colonoscopy. Completed 2011.  Repeat every 10 years She has had severe constipation - once this is resolved she will return Cologuard  Mammogram status: No longer required due to declines.  Bone Density status: Completed 10/06/2016. Results reflect: Bone density results: OSTEOPENIA. Repeat every 2 years. She will schedule soon or do at next visit  Lung Cancer Screening: (Low Dose CT Chest recommended if Age 75-80years, 30 pack-year currently smoking OR have quit w/in 15years.) does not qualify.    Additional Screening:  Hepatitis C Screening: does qualify; Completed 06/17/2016  Vision Screening: Recommended annual ophthalmology exams for early detection of glaucoma and other disorders of the eye. Is the patient up to date with their annual eye exam?  Yes  Who is the provider or what is the name of the office in which the patient attends annual eye exams? [facility] is not established with a provider, would they like to be referred to a provider to establish care? No .   Dental Screening: Recommended annual dental exams for proper oral hygiene  Community Resource Referral / Chronic Care Management: CRR required this visit?  No   CCM required this visit?  No      Plan:     I have personally reviewed and noted the following in the patient's chart:   Medical and social history Use of alcohol, tobacco or illicit drugs  Current medications and supplements including opioid prescriptions. Patient is currently taking opioid prescriptions. Information provided to patient regarding non-opioid alternatives. Patient advised to discuss non-opioid treatment plan with their provider. Functional ability and status Nutritional status Physical activity Advanced directives List of other physicians Hospitalizations, surgeries, and ER visits in previous 12 months Vitals Screenings to include cognitive, depression, and falls Referrals and appointments  In addition, I have reviewed and discussed with  patient certain preventive protocols, quality metrics, and best practice recommendations. A written personalized care plan for preventive services as well as general preventive health recommendations were provided to patient.     ASandrea Hammond LPN   [id number]  Nurse Notes: ultrasound in legs is scheduled for next month because her lower legs have been hurting so bad - also has visit scheduled with Neurologist

## 2021-11-20 ENCOUNTER — Other Ambulatory Visit (HOSPITAL_COMMUNITY): Payer: Self-pay | Admitting: Specialist

## 2021-11-20 DIAGNOSIS — R0989 Other specified symptoms and signs involving the circulatory and respiratory systems: Secondary | ICD-10-CM

## 2021-11-21 DIAGNOSIS — M25641 Stiffness of right hand, not elsewhere classified: Secondary | ICD-10-CM | POA: Diagnosis not present

## 2021-11-27 ENCOUNTER — Ambulatory Visit (HOSPITAL_COMMUNITY)
Admission: RE | Admit: 2021-11-27 | Discharge: 2021-11-27 | Disposition: A | Discharge status: Home / Self Care | Payer: Medicare Other | Source: Ambulatory Visit | Attending: Internal Medicine | Admitting: Internal Medicine

## 2021-11-27 DIAGNOSIS — R0989 Other specified symptoms and signs involving the circulatory and respiratory systems: Secondary | ICD-10-CM | POA: Insufficient documentation

## 2021-11-28 ENCOUNTER — Ambulatory Visit (INDEPENDENT_AMBULATORY_CARE_PROVIDER_SITE_OTHER): Payer: Medicare Other | Admitting: Vascular Surgery

## 2021-11-28 ENCOUNTER — Encounter: Payer: Self-pay | Admitting: Vascular Surgery

## 2021-11-28 VITALS — BP 124/85 | HR 66 | Temp 98.2°F | Resp 18 | Ht 62.0 in | Wt 148.9 lb

## 2021-11-28 DIAGNOSIS — I70219 Atherosclerosis of native arteries of extremities with intermittent claudication, unspecified extremity: Secondary | ICD-10-CM

## 2021-11-28 NOTE — H&P (View-Only) (Signed)
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  Fortunately she is not a smoker.  I have instructed her to remain as active as possible to help promote collateral flow.  However given that she is having rest pain and thus critical limb ischemia I would recommend that we proceed with arteriography to further evaluate our options for revascularization.  I have reviewed with the patient the indications for arteriography. In addition, I have reviewed the potential complications of arteriography including but not limited to: Bleeding, arterial injury, arterial thrombosis, dye action, renal insufficiency, or other unpredictable medical problems. I have explained to the patient that if we find disease amenable to angioplasty we could potentially address this at the same time. I have discussed the potential complications of angioplasty and stenting, including but not limited to: Bleeding, arterial thrombosis, arterial injury, dissection, or the need for surgical intervention.   I will likely plan on cannulating the left groin.  Her noninvasive study did show monophasic signals in the left common femoral artery although I cannot palpate a pulse here.  Her symptoms are more significant on the right side regardless.  Thus this would give [place] the most options on the right but also allow [place] to address any inflow disease on the left if needed.  She is on a statin.  I have instructed her to begin taking aspirin daily (81 mg).  Recommend the following which can slow the progression of atherosclerosis and reduce the risk of major adverse cardiac / limb events:  Aspirin 43m PO QD.  Atorvastatin 40-80mg PO QD (or other "high intensity" statin therapy). Complete cessation from all tobacco products. Blood glucose control with goal A1c < 7%. Blood pressure control with goal  blood pressure < 140/90 mmHg. Lipid reduction therapy with goal LDL-C <100 mg/dL (<70 if symptomatic from PAD).    REASON FOR CONSULT:    Peripheral arterial disease with claudication.  The consult is requested by Dr. JErasmo Score   HPI:   Sonya LAFALCEis a 76y.o. female with a history of significant degenerative disc disease of her back.  She was also complaining of some claudication which prompted arterial Doppler studies which were abnormal yesterday.  She was sent for vascular consultation.  I have reviewed the records from the referring office.  The patient has significant issues with degenerative disc disease of her back.  On my history, the patient's activity has been limited somewhat by her back issues however she began walking more this morning and noted claudication in both legs but more significantly on the right.  She gets pain in her calves which is brought on by ambulation and relieved with rest.  She denies significant thigh or hip claudication.  Her symptoms have gradually progressed and now she can only walk about 10 steps before experiencing symptoms.  She is also developed some rest pain in her right foot at night.  She denies any history of nonhealing wounds.  Her risk factors for peripheral arterial disease include diabetes, hypertension, hypercholesterolemia, and a remote history of tobacco use.  She quit in 2002.  She denies any family history of premature cardiovascular disease.  Past Medical History:  Diagnosis Date   Anxiety    Chronic kidney disease    Depression    Diabetes mellitus without complication ([place]    type II    Dyslipidemia  Family history of adverse reaction to anesthesia    daughter - slow to wake up    GERD (gastroesophageal reflux disease)    Hiatal hernia    Hyperlipidemia    Hypertension    Internal hemorrhoids    Osteopenia    PONV (postoperative nausea and vomiting)    Vitamin D deficiency     Family History  Problem Relation  Age of Onset   Hodgkin's lymphoma Mother    Cancer Father        Lungs   Colon cancer Neg Hx    Diabetes Neg Hx    Stomach cancer Neg Hx    Rectal cancer Neg Hx     SOCIAL HISTORY: Social History   Tobacco Use   Smoking status: Former    Types: Cigarettes    Start date: 04/22/2000   Smokeless tobacco: Never  Substance Use Topics   Alcohol use: Yes    Comment: occsional use    Allergies  Allergen Reactions   Codeine     REACTION: hives   Ibuprofen    Penicillins Hives    Has patient had a PCN reaction causing immediate rash, facial/tongue/throat swelling, SOB or lightheadedness with hypotension: Yes Has patient had a PCN reaction causing severe rash involving mucus membranes or skin necrosis: No Has patient had a PCN reaction that required hospitalization: No Has patient had a PCN reaction occurring within the last 10 years: No If all of the above answers are "NO", then may proceed with Cephalosporin use.    Jardiance [Empagliflozin]     Felt bad, couldn't get out of bed    Current Outpatient Medications  Medication Sig Dispense Refill   atorvastatin (LIPITOR) 40 MG tablet TAKE ONE (1) TABLET EACH DAY 30 tablet 5   DULoxetine (CYMBALTA) 60 MG capsule TAKE ONE (1) CAPSULE EACH DAY 30 capsule 5   fentaNYL (DURAGESIC) 75 MCG/HR Place onto the skin.     liraglutide (VICTOZA) 18 MG/3ML SOPN Inject 1.8 mg into the skin daily. 6 mL 3   lisinopril (ZESTRIL) 10 MG tablet TAKE ONE (1) TABLET EACH DAY 30 tablet 5   Omega-3 Fatty Acids (FISH OIL MAXIMUM STRENGTH) 1200 MG CAPS Take 3,600 mg by mouth 2 (two) times daily.      omeprazole-sodium bicarbonate (ZEGERID) 40-1100 MG capsule Take 1 capsule by mouth 2 (two) times daily.      UNIFINE PENTIPS 31G X 6 MM MISC      busPIRone (BUSPAR) 7.5 MG tablet Take 1 tablet (7.5 mg total) by mouth 3 (three) times daily. (Patient not taking: Reported on 11/19/2021) 90 tablet 3   NARCAN 4 MG/0.1ML LIQD nasal spray kit  (Patient not taking:  Reported on 11/19/2021)     ondansetron (ZOFRAN) 4 MG tablet TAKE 1 TABLET EVERY 8 HOURS AS NEEDED FOR NAUSEA AND VOMITING (Patient not taking: Reported on 11/19/2021) 20 tablet 0   pregabalin (LYRICA) 25 MG capsule Take by mouth. (Patient not taking: Reported on 11/28/2021)     No current facility-administered medications for this visit.    REVIEW OF SYSTEMS:  [X]  denotes positive finding, [ ]  denotes negative finding Cardiac  Comments:  Chest pain or chest pressure:    Shortness of breath upon exertion: x   Short of breath when lying flat:    Irregular heart rhythm:        Vascular    Pain in calf, thigh, or hip brought on by ambulation: x   Pain in feet at night  that wakes you up from your sleep:  x   Blood clot in your veins:    Leg swelling:         Pulmonary    Oxygen at home:    Productive cough:     Wheezing:         Neurologic    Sudden weakness in arms or legs:     Sudden numbness in arms or legs:     Sudden onset of difficulty speaking or slurred speech:    Temporary loss of vision in one eye:     Problems with dizziness:         Gastrointestinal    Blood in stool:     Vomited blood:         Genitourinary    Burning when urinating:     Blood in urine:        Psychiatric    Major depression:         Hematologic    Bleeding problems:    Problems with blood clotting too easily:        Skin    Rashes or ulcers:        Constitutional    Fever or chills:    -  PHYSICAL EXAM:   Vitals:   11/28/21 1108  BP: 124/85  Pulse: 66  Resp: 18  Temp: 98.2 F (36.8 C)  SpO2: 95%  Weight: 148 lb 14.4 oz (67.5 kg)  Height: 5' 2"  (1.575 m)   Body mass index is 27.23 kg/m.  GENERAL: The patient is a well-nourished female, in no acute distress. The vital signs are documented above. CARDIAC: There is a regular rate and rhythm.  VASCULAR: I do not detect carotid bruits. On the right side she has a palpable femoral pulse.  I cannot palpate a popliteal or pedal  pulses. On the left side she has a palpable femoral pulse.  I cannot palpate popliteal or pedal pulses. She has no significant lower extremity swelling. PULMONARY: There is good air exchange bilaterally without wheezing or rales. ABDOMEN: Soft and non-tender with normal pitched bowel sounds.  I do not palpate any aneurysm. MUSCULOSKELETAL: There are no major deformities. NEUROLOGIC: No focal weakness or paresthesias are detected. SKIN: There are no ulcers or rashes noted. PSYCHIATRIC: The patient has a normal affect.  DATA:    ARTERIAL DOPPLER STUDY: I have reviewed the arterial Doppler study that was done yesterday.  On the right side the common femoral artery had a multiphasic signal.  The popliteal and posterior tibial arteries had dampened monophasic signals.  The dorsalis pedis signal was monophasic and the peroneal was an audible.  ABI on the right was 39% with a toe pressure of 0.  On the left side, there was a monophasic common femoral artery signal.  The popliteal signal was monophasic.  The posterior tibial signal was an audible.  The peroneal and dorsalis pedis signals were dampened and monophasic.  ABI was 55% on the left with a toe pressure of 54 mmHg.  Deitra Mayo [facility]

## 2021-11-28 NOTE — Progress Notes (Signed)
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  Fortunately she is not a smoker.  I have instructed her to remain as active as possible to help promote collateral flow.  However given that she is having rest pain and thus critical limb ischemia I would recommend that we proceed with arteriography to further evaluate our options for revascularization.  I have reviewed with the patient the indications for arteriography. In addition, I have reviewed the potential complications of arteriography including but not limited to: Bleeding, arterial injury, arterial thrombosis, dye action, renal insufficiency, or other unpredictable medical problems. I have explained to the patient that if we find disease amenable to angioplasty we could potentially address this at the same time. I have discussed the potential complications of angioplasty and stenting, including but not limited to: Bleeding, arterial thrombosis, arterial injury, dissection, or the need for surgical intervention.   I will likely plan on cannulating the left groin.  Her noninvasive study did show monophasic signals in the left common femoral artery although I cannot palpate a pulse here.  Her symptoms are more significant on the right side regardless.  Thus this would give [place] the most options on the right but also allow [place] to address any inflow disease on the left if needed.  She is on a statin.  I have instructed her to begin taking aspirin daily (81 mg).  Recommend the following which can slow the progression of atherosclerosis and reduce the risk of major adverse cardiac / limb events:  Aspirin 57m PO QD.  Atorvastatin 40-80mg PO QD (or other "high intensity" statin therapy). Complete cessation from all tobacco products. Blood glucose control with goal A1c < 7%. Blood pressure control with goal  blood pressure < 140/90 mmHg. Lipid reduction therapy with goal LDL-C <100 mg/dL (<70 if symptomatic from PAD).    REASON FOR CONSULT:    Peripheral arterial disease with claudication.  The consult is requested by Dr. JErasmo Score   HPI:   Sonya NICHOLLSis a 76y.o. female with a history of significant degenerative disc disease of her back.  She was also complaining of some claudication which prompted arterial Doppler studies which were abnormal yesterday.  She was sent for vascular consultation.  I have reviewed the records from the referring office.  The patient has significant issues with degenerative disc disease of her back.  On my history, the patient's activity has been limited somewhat by her back issues however she began walking more this morning and noted claudication in both legs but more significantly on the right.  She gets pain in her calves which is brought on by ambulation and relieved with rest.  She denies significant thigh or hip claudication.  Her symptoms have gradually progressed and now she can only walk about 10 steps before experiencing symptoms.  She is also developed some rest pain in her right foot at night.  She denies any history of nonhealing wounds.  Her risk factors for peripheral arterial disease include diabetes, hypertension, hypercholesterolemia, and a remote history of tobacco use.  She quit in 2002.  She denies any family history of premature cardiovascular disease.  Past Medical History:  Diagnosis Date   Anxiety    Chronic kidney disease    Depression    [place]    type II    Dyslipidemia  Family history of adverse reaction to anesthesia    daughter - slow to wake up    GERD (gastroesophageal reflux disease)    Hiatal hernia    Hyperlipidemia    Hypertension    Internal hemorrhoids    Osteopenia    PONV (postoperative nausea and vomiting)    Vitamin D deficiency     Family History  Problem Relation  Age of Onset   Hodgkin's lymphoma Mother    Cancer Father        Lungs   Colon cancer Neg Hx    Diabetes Neg Hx    Stomach cancer Neg Hx    Rectal cancer Neg Hx     SOCIAL HISTORY: Social History   Tobacco Use   Smoking status: Former    Types: Cigarettes    Start date: 04/22/2000   Smokeless tobacco: Never  Substance Use Topics   Alcohol use: Yes    Comment: occsional use    Allergies  Allergen Reactions   Codeine     REACTION: hives   Ibuprofen    Penicillins Hives    Has patient had a PCN reaction causing immediate rash, facial/tongue/throat swelling, SOB or lightheadedness with hypotension: Yes Has patient had a PCN reaction causing severe rash involving mucus membranes or skin necrosis: No Has patient had a PCN reaction that required hospitalization: No Has patient had a PCN reaction occurring within the last 10 years: No If all of the above answers are "NO", then may proceed with Cephalosporin use.    Jardiance [Empagliflozin]     Felt bad, couldn't get out of bed    Current Outpatient Medications  Medication Sig Dispense Refill   atorvastatin (LIPITOR) 40 MG tablet TAKE ONE (1) TABLET EACH DAY 30 tablet 5   DULoxetine (CYMBALTA) 60 MG capsule TAKE ONE (1) CAPSULE EACH DAY 30 capsule 5   fentaNYL (DURAGESIC) 75 MCG/HR Place onto the skin.     liraglutide (VICTOZA) 18 MG/3ML SOPN Inject 1.8 mg into the skin daily. 6 mL 3   lisinopril (ZESTRIL) 10 MG tablet TAKE ONE (1) TABLET EACH DAY 30 tablet 5   Omega-3 Fatty Acids (FISH OIL MAXIMUM STRENGTH) 1200 MG CAPS Take 3,600 mg by mouth 2 (two) times daily.      omeprazole-sodium bicarbonate (ZEGERID) 40-1100 MG capsule Take 1 capsule by mouth 2 (two) times daily.      UNIFINE PENTIPS 31G X 6 MM MISC      busPIRone (BUSPAR) 7.5 MG tablet Take 1 tablet (7.5 mg total) by mouth 3 (three) times daily. (Patient not taking: Reported on 11/19/2021) 90 tablet 3   NARCAN 4 MG/0.1ML LIQD nasal spray kit  (Patient not taking:  Reported on 11/19/2021)     ondansetron (ZOFRAN) 4 MG tablet TAKE 1 TABLET EVERY 8 HOURS AS NEEDED FOR NAUSEA AND VOMITING (Patient not taking: Reported on 11/19/2021) 20 tablet 0   pregabalin (LYRICA) 25 MG capsule Take by mouth. (Patient not taking: Reported on 11/28/2021)     No current facility-administered medications for this visit.    REVIEW OF SYSTEMS:  [X]  denotes positive finding, [ ]  denotes negative finding Cardiac  Comments:  Chest pain or chest pressure:    Shortness of breath upon exertion: x   Short of breath when lying flat:    Irregular heart rhythm:        Vascular    Pain in calf, thigh, or hip brought on by ambulation: x   Pain in feet at night  that wakes you up from your sleep:  x   Blood clot in your veins:    Leg swelling:         Pulmonary    Oxygen at home:    Productive cough:     Wheezing:         Neurologic    Sudden weakness in arms or legs:     Sudden numbness in arms or legs:     Sudden onset of difficulty speaking or slurred speech:    Temporary loss of vision in one eye:     Problems with dizziness:         Gastrointestinal    Blood in stool:     Vomited blood:         Genitourinary    Burning when urinating:     Blood in urine:        Psychiatric    Major depression:         Hematologic    Bleeding problems:    Problems with blood clotting too easily:        Skin    Rashes or ulcers:        Constitutional    Fever or chills:    -  PHYSICAL EXAM:   Vitals:   11/28/21 1108  BP: 124/85  Pulse: 66  Resp: 18  Temp: 98.2 F (36.8 C)  SpO2: 95%  Weight: 148 lb 14.4 oz (67.5 kg)  Height: 5' 2"  (1.575 m)   Body mass index is 27.23 kg/m.  GENERAL: The patient is a well-nourished female, in no acute distress. The vital signs are documented above. CARDIAC: There is a regular rate and rhythm.  VASCULAR: I do not detect carotid bruits. On the right side she has a palpable femoral pulse.  I cannot palpate a popliteal or pedal  pulses. On the left side she has a palpable femoral pulse.  I cannot palpate popliteal or pedal pulses. She has no significant lower extremity swelling. PULMONARY: There is good air exchange bilaterally without wheezing or rales. ABDOMEN: Soft and non-tender with normal pitched bowel sounds.  I do not palpate any aneurysm. MUSCULOSKELETAL: There are no major deformities. NEUROLOGIC: No focal weakness or paresthesias are detected. SKIN: There are no ulcers or rashes noted. PSYCHIATRIC: The patient has a normal affect.  DATA:    ARTERIAL DOPPLER STUDY: I have reviewed the arterial Doppler study that was done yesterday.  On the right side the common femoral artery had a multiphasic signal.  The popliteal and posterior tibial arteries had dampened monophasic signals.  The dorsalis pedis signal was monophasic and the peroneal was an audible.  ABI on the right was 39% with a toe pressure of 0.  On the left side, there was a monophasic common femoral artery signal.  The popliteal signal was monophasic.  The posterior tibial signal was an audible.  The peroneal and dorsalis pedis signals were dampened and monophasic.  ABI was 55% on the left with a toe pressure of 54 mmHg.  Deitra Mayo [facility]

## 2021-11-29 ENCOUNTER — Other Ambulatory Visit: Payer: Self-pay

## 2021-11-29 DIAGNOSIS — I70223 Atherosclerosis of native arteries of extremities with rest pain, bilateral legs: Secondary | ICD-10-CM

## 2021-12-17 DIAGNOSIS — G894 Chronic pain syndrome: Secondary | ICD-10-CM | POA: Diagnosis not present

## 2021-12-17 DIAGNOSIS — M5136 Other intervertebral disc degeneration, lumbar region: Secondary | ICD-10-CM | POA: Diagnosis not present

## 2021-12-17 DIAGNOSIS — M5416 Radiculopathy, lumbar region: Secondary | ICD-10-CM | POA: Diagnosis not present

## 2021-12-19 ENCOUNTER — Ambulatory Visit: Payer: Medicare Other | Admitting: Neurology

## 2021-12-27 ENCOUNTER — Ambulatory Visit (HOSPITAL_COMMUNITY)
Admission: RE | Admit: 2021-12-27 | Discharge: 2021-12-27 | Disposition: A | Discharge status: Home / Self Care | Payer: Medicare Other | Attending: Vascular Surgery | Admitting: Vascular Surgery

## 2021-12-27 ENCOUNTER — Encounter (HOSPITAL_COMMUNITY)
Admission: RE | Disposition: A | Discharge status: Home / Self Care | Payer: Self-pay | Source: Home / Self Care | Attending: Vascular Surgery

## 2021-12-27 ENCOUNTER — Other Ambulatory Visit: Payer: Self-pay

## 2021-12-27 ENCOUNTER — Encounter (HOSPITAL_COMMUNITY): Payer: Self-pay | Admitting: Vascular Surgery

## 2021-12-27 DIAGNOSIS — I70221 Atherosclerosis of native arteries of extremities with rest pain, right leg: Secondary | ICD-10-CM | POA: Insufficient documentation

## 2021-12-27 DIAGNOSIS — E7800 Pure hypercholesterolemia, unspecified: Secondary | ICD-10-CM | POA: Insufficient documentation

## 2021-12-27 DIAGNOSIS — M5136 Other intervertebral disc degeneration, lumbar region: Secondary | ICD-10-CM | POA: Diagnosis not present

## 2021-12-27 DIAGNOSIS — E1151 Type 2 diabetes mellitus with diabetic peripheral angiopathy without gangrene: Secondary | ICD-10-CM | POA: Diagnosis not present

## 2021-12-27 DIAGNOSIS — I10 Essential (primary) hypertension: Secondary | ICD-10-CM | POA: Insufficient documentation

## 2021-12-27 DIAGNOSIS — Z7985 Long-term (current) use of injectable non-insulin antidiabetic drugs: Secondary | ICD-10-CM | POA: Insufficient documentation

## 2021-12-27 DIAGNOSIS — I70223 Atherosclerosis of native arteries of extremities with rest pain, bilateral legs: Secondary | ICD-10-CM

## 2021-12-27 DIAGNOSIS — Z87891 Personal history of nicotine dependence: Secondary | ICD-10-CM | POA: Insufficient documentation

## 2021-12-27 HISTORY — PX: PERIPHERAL VASCULAR INTERVENTION: CATH118257

## 2021-12-27 HISTORY — PX: ABDOMINAL AORTOGRAM W/LOWER EXTREMITY: CATH118223

## 2021-12-27 LAB — GLUCOSE, CAPILLARY: Glucose-Capillary: 121 mg/dL — ABNORMAL HIGH (ref 70–99)

## 2021-12-27 LAB — POCT I-STAT, CHEM 8
BUN: 21 mg/dL (ref 8–23)
Calcium, Ion: 1.18 mmol/L (ref 1.15–1.40)
Chloride: 100 mmol/L (ref 98–111)
Creatinine, Ser: 1 mg/dL (ref 0.44–1.00)
Glucose, Bld: 131 mg/dL — ABNORMAL HIGH (ref 70–99)
HCT: 33 % — ABNORMAL LOW (ref 36.0–46.0)
Hemoglobin: 11.2 g/dL — ABNORMAL LOW (ref 12.0–15.0)
Potassium: 3.8 mmol/L (ref 3.5–5.1)
Sodium: 141 mmol/L (ref 135–145)
TCO2: 31 mmol/L (ref 22–32)

## 2021-12-27 LAB — POCT ACTIVATED CLOTTING TIME
Activated Clotting Time: 227 seconds
Activated Clotting Time: 257 seconds

## 2021-12-27 SURGERY — ABDOMINAL AORTOGRAM W/LOWER EXTREMITY
Anesthesia: LOCAL | Laterality: Right

## 2021-12-27 MED ORDER — SODIUM CHLORIDE 0.9 % IV SOLN: INTRAVENOUS | Status: DC

## 2021-12-27 MED ORDER — FENTANYL CITRATE (PF) 100 MCG/2ML IJ SOLN
INTRAMUSCULAR | Status: AC
  Filled 2021-12-27: qty 2

## 2021-12-27 MED ORDER — CLOPIDOGREL BISULFATE 300 MG PO TABS
ORAL_TABLET | ORAL | Status: AC
  Filled 2021-12-27: qty 1

## 2021-12-27 MED ORDER — SODIUM CHLORIDE 0.9 % IV SOLN: 250.0000 mL | INTRAVENOUS | Status: DC | PRN

## 2021-12-27 MED ORDER — MIDAZOLAM HCL 2 MG/2ML IJ SOLN
INTRAMUSCULAR | Status: DC | PRN
  Administered 2021-12-27: 1 mg via INTRAVENOUS

## 2021-12-27 MED ORDER — LIDOCAINE HCL (PF) 1 % IJ SOLN
INTRAMUSCULAR | Status: AC
Start: 2021-12-27 — End: ?
  Filled 2021-12-27: qty 30

## 2021-12-27 MED ORDER — IODIXANOL 320 MG/ML IV SOLN
INTRAVENOUS | Status: DC | PRN
  Administered 2021-12-27: 190 mL

## 2021-12-27 MED ORDER — LIDOCAINE HCL (PF) 1 % IJ SOLN
INTRAMUSCULAR | Status: DC | PRN
  Administered 2021-12-27: 10 mL

## 2021-12-27 MED ORDER — SODIUM CHLORIDE 0.9% FLUSH: 3.0000 mL | INTRAVENOUS | Status: DC | PRN

## 2021-12-27 MED ORDER — MIDAZOLAM HCL 2 MG/2ML IJ SOLN
INTRAMUSCULAR | Status: AC
  Filled 2021-12-27: qty 2

## 2021-12-27 MED ORDER — LABETALOL HCL 5 MG/ML IV SOLN: 10.0000 mg | INTRAVENOUS | Status: DC | PRN

## 2021-12-27 MED ORDER — CLOPIDOGREL BISULFATE 75 MG PO TABS: 75.0000 mg | ORAL_TABLET | Freq: Every day | ORAL | 11 refills | Status: DC

## 2021-12-27 MED ORDER — HEPARIN (PORCINE) IN NACL 1000-0.9 UT/500ML-% IV SOLN
INTRAVENOUS | Status: AC
  Filled 2021-12-27: qty 500

## 2021-12-27 MED ORDER — ACETAMINOPHEN 325 MG PO TABS: 650.0000 mg | ORAL_TABLET | ORAL | Status: DC | PRN

## 2021-12-27 MED ORDER — CLOPIDOGREL BISULFATE 75 MG PO TABS: 75.0000 mg | ORAL_TABLET | Freq: Every day | ORAL | Status: DC

## 2021-12-27 MED ORDER — SODIUM CHLORIDE 0.9% FLUSH: 3.0000 mL | Freq: Two times a day (BID) | INTRAVENOUS | Status: DC

## 2021-12-27 MED ORDER — CLOPIDOGREL BISULFATE 300 MG PO TABS
ORAL_TABLET | ORAL | Status: DC | PRN
  Administered 2021-12-27: 300 mg via ORAL

## 2021-12-27 MED ORDER — HEPARIN SODIUM (PORCINE) 1000 UNIT/ML IJ SOLN
INTRAMUSCULAR | Status: DC | PRN
  Administered 2021-12-27: 7000 [IU] via INTRAVENOUS
  Administered 2021-12-27: 1000 [IU] via INTRAVENOUS

## 2021-12-27 MED ORDER — ONDANSETRON HCL 4 MG/2ML IJ SOLN: 4.0000 mg | Freq: Four times a day (QID) | INTRAMUSCULAR | Status: DC | PRN

## 2021-12-27 MED ORDER — HEPARIN (PORCINE) IN NACL 1000-0.9 UT/500ML-% IV SOLN
INTRAVENOUS | Status: DC | PRN
  Administered 2021-12-27 (×2): 500 mL

## 2021-12-27 MED ORDER — SODIUM CHLORIDE 0.9 % WEIGHT BASED INFUSION: 1.0000 mL/kg/h | INTRAVENOUS | Status: DC

## 2021-12-27 MED ORDER — HYDRALAZINE HCL 20 MG/ML IJ SOLN: 5.0000 mg | INTRAMUSCULAR | Status: DC | PRN

## 2021-12-27 MED ORDER — FENTANYL CITRATE (PF) 100 MCG/2ML IJ SOLN
INTRAMUSCULAR | Status: DC | PRN
  Administered 2021-12-27 (×2): 25 ug via INTRAVENOUS

## 2021-12-27 SURGICAL SUPPLY — 21 items
BAG SNAP BAND KOVER 36X36 (MISCELLANEOUS) IMPLANT
CATH ANGIO 5F PIGTAIL 65CM (CATHETERS) IMPLANT
CATH CROSS OVER TEMPO 5F (CATHETERS) IMPLANT
CATH MUSTANG 4X200X135 (BALLOONS) IMPLANT
CATH QUICKCROSS .035X135CM (MICROCATHETER) IMPLANT
CLOSURE MYNX CONTROL 6F/7F (Vascular Products) IMPLANT
DEVICE TORQUE .025-.038 (MISCELLANEOUS) IMPLANT
GLIDEWIRE ADV .035X260CM (WIRE) IMPLANT
KIT ENCORE 26 ADVANTAGE (KITS) IMPLANT
KIT MICROPUNCTURE NIT STIFF (SHEATH) IMPLANT
SHEATH PINNACLE 5F 10CM (SHEATH) IMPLANT
SHEATH PINNACLE 6F 10CM (SHEATH) IMPLANT
SHEATH PINNACLE MP 6F 45CM (SHEATH) IMPLANT
SHEATH PROBE COVER 6X72 (BAG) IMPLANT
STENT INNOVA 5X120X130 (Permanent Stent) IMPLANT
STENT INNOVA 5X150X130 (Permanent Stent) IMPLANT
SYR MEDRAD MARK V 150ML (SYRINGE) IMPLANT
TRANSDUCER W/STOPCOCK (MISCELLANEOUS) ×2 IMPLANT
TRAY PV CATH (CUSTOM PROCEDURE TRAY) ×2 IMPLANT
WIRE G V18X300CM (WIRE) IMPLANT
WIRE HITORQ VERSACORE ST 145CM (WIRE) IMPLANT

## 2021-12-27 NOTE — Op Note (Signed)
PATIENT: Sonya Cook      MRN: [id number] DOB: Aug 09, 1945    DATE OF PROCEDURE: 12/27/2021  INDICATIONS:    Sonya Cook is a 76 y.o. female who presented with disabling claudication of both lower extremities and rest pain on the right leg.  She had evidence of infrainguinal arterial occlusive disease.  PROCEDURE:    Conscious sedation Ultrasound-guided access to the left common femoral artery Aortogram with runoff Selective catheterization of the right external iliac artery with right lower extremity runoff. Angioplasty and stenting of the right superficial femoral artery Minx closure of the left femoral artery  SURGEON: Judeth Cornfield. Scot Dock, MD, FACS  ANESTHESIA: Local with sedation  EBL: Min mL  TECHNIQUE: The patient was brought to the peripheral vascular lab and was sedated. The period of conscious sedation was 88 minutes.  During that time period, I was present face-to-face 100% of the time.  The patient was administered 1 mg of Versed and 50 mcg of fentanyl. The patient's heart rate, blood pressure, and oxygen saturation were monitored by the nurse continuously during the procedure.  Both groins were prepped and draped in the usual sterile fashion.  Under ultrasound guidance, after the skin was anesthetized, I cannulated the left common femoral artery with a micropuncture needle and a micropuncture sheath was introduced over a wire.  This was exchanged for a 5 [place] sheath over a Bentson wire.  By ultrasound the femoral artery was patent. A real-time image was obtained and sent to the server.  A pigtail catheter was positioned at the L1 vertebral body and flush aortogram obtained.  The catheter was then positioned above the aortic bifurcation and an oblique iliac projection was obtained.  Next, bilateral lower extremity runoff films were obtained.  There was significant disease in the superficial femoral artery which I felt was amenable to angioplasty and stenting.    I  then used a crossover catheter to direct the wire down the right iliac system.  I then exchanged the 5 French sheath for a 45 cm 6 [place] destination sheath which was positioned in the common femoral artery on the right.  I was then able to use a V18 wire to get through the areas of severe stenosis in the superficial femoral artery with short segment occlusions using a quick cross catheter for support.  This was then positioned below the disease the wire was removed and contrast demonstrated we were in the lumen distally.  I then selected a 4 mm x 20 cm balloon which was inflated to nominal pressure for 1 minute.  I then shot another picture to demonstrate the extent of the disease proximally and the proximal segment of the SFA was ballooned.  Next I positioned in an oval 5 x 150 self-expanding stent distally and this was deployed without difficulty.  I then overlapped a 5 x 120 stent into the proximal SFA.  Postdilatation was done with the 4 x 200 balloon.  Completion films showed no residual stenosis.  The sheath was then retracted into the common iliac artery on the left and the wire exchanged for a Bentson wire.  The long 6 French sheath was exchanged for a short 6 [place] sheath.  Next the left groin was closed with the Mynx device.  FINDINGS:   There are single renal arteries bilaterally with no significant renal artery stenosis identified.  The infrarenal aorta is widely patent.  Bilateral common iliac arteries, external iliac arteries, and hypogastric arteries are patent. On the  right side, which is the more symptomatic side, there was a long segment stenosis with several areas of occlusion in the superficial femoral artery which was successfully addressed with angioplasty and stenting as described above.  Below that there was three-vessel runoff via the anterior tibial, peroneal, and posterior tibial arteries.  The peroneal is quite diminutive in size.  The tibials are small. On the left side, the  common femoral and deep femoral artery are patent.  There is severe diffuse disease in the proximal SFA which that is occluded proximally.  There is reconstitution of the above-knee popliteal artery.  There is single-vessel runoff on the left via the peroneal artery.  The anterior tibial and posterior tibial arteries are occluded.  CLINICAL NOTE: This patient was more symptomatic on the right side.  ABI was 39% preoperatively.  We have now addressed the disease on the right.  If her symptoms do not improve on the left (ABI was 55% on the left) then she would be a candidate for a left femoral to above-knee popliteal artery bypass.  When she returns to the office for follow-up I will arrange for a vein map on the left.  I have started her on Plavix.  She was already on aspirin and a statin.    TASC Classification  Largest Sheath Size: 6 [place]  Target vessel: Right superficial femoral artery  % Stenosis: Pre100%. Post 0%.  Lesion length: 20 cm  Calcification: Yes  Most impactful devices used (Up to 3): 4 x 200 balloon and 5 x 150 and 5 x 120 unknowable stent  Outflow: Disease present or not distal to the lesion treated and the  Flow in the distal vessel: Yes   Deitra Mayo, MD, FACS [facility] [place] DICTATION:   12/27/2021

## 2021-12-27 NOTE — Progress Notes (Signed)
Patient was given discharge instructions. She verbalized undrstanding.

## 2021-12-27 NOTE — Interval H&P Note (Signed)
History and Physical Interval Note:  12/27/2021 7:52 AM  Sonya Cook  has presented today for surgery, with the diagnosis of critical limb ischemia bilateral lower extremities with rest pain.  The various methods of treatment have been discussed with the patient and family. After consideration of risks, benefits and other options for treatment, the patient has consented to  Procedure(s): ABDOMINAL AORTOGRAM W/LOWER EXTREMITY (N/A) as a surgical intervention.  The patient's history has been reviewed, patient examined, no change in status, stable for surgery.  I have reviewed the patient's chart and labs.  Questions were answered to the patient's satisfaction.     Deitra Mayo

## 2022-01-01 ENCOUNTER — Telehealth: Payer: Self-pay | Admitting: *Deleted

## 2022-01-01 NOTE — Telephone Encounter (Signed)
Patient called stating she had slight swelling and moderate bruising from procedure on 12/27/2021 with some stiffness.  She states that she thinks she tried to do too much too quickly.  I instructed patient to elevate legs when not walking and applying arnicare would help the bruising.  Patient voiced understanding of the instructions.

## 2022-01-07 ENCOUNTER — Ambulatory Visit (INDEPENDENT_AMBULATORY_CARE_PROVIDER_SITE_OTHER): Payer: Medicare Other | Admitting: Family

## 2022-01-07 ENCOUNTER — Encounter: Payer: Self-pay | Admitting: Family

## 2022-01-07 VITALS — BP 137/55 | HR 88 | Temp 98.0°F | Ht 61.5 in | Wt 154.8 lb

## 2022-01-07 DIAGNOSIS — Z23 Encounter for immunization: Secondary | ICD-10-CM

## 2022-01-07 DIAGNOSIS — M109 Gout, unspecified: Secondary | ICD-10-CM | POA: Diagnosis not present

## 2022-01-07 MED ORDER — PREDNISONE 20 MG PO TABS: 40.0000 mg | ORAL_TABLET | Freq: Every day | ORAL | 0 refills | Status: AC

## 2022-01-07 MED ORDER — COLCHICINE 0.6 MG PO TABS: ORAL_TABLET | ORAL | 0 refills | Status: DC

## 2022-01-07 NOTE — Patient Instructions (Signed)
Gout  Gout is a condition that causes painful swelling of the joints. Gout is a type of inflammation of the joints (arthritis). This condition is caused by having too much uric acid in the body. Uric acid is a chemical that forms when the body breaks down substances called purines. Purines are important for building body proteins. When the body has too much uric acid, sharp crystals can form and build up inside the joints. This causes pain and swelling. Gout attacks can happen quickly and may be very painful (acute gout). Over time, the attacks can affect more joints and become more frequent (chronic gout). Gout can also cause uric acid to build up under the skin and inside the kidneys. What are the causes? This condition is caused by too much uric acid in your blood. This can happen because: Your kidneys do not remove enough uric acid from your blood. This is the most common cause. Your body makes too much uric acid. This can happen with some cancers and cancer treatments. It can also occur if your body is breaking down too many red blood cells (hemolytic anemia). You eat too many foods that are high in purines. These foods include organ meats and some seafood. Alcohol, especially beer, is also high in purines. A gout attack may be triggered by trauma or stress. What increases the risk? The following factors may make you more likely to develop this condition: Having a family history of gout. Being female and middle-aged. Being female and having gone through menopause. Taking certain medicines, including aspirin, cyclosporine, diuretics, levodopa, and niacin. Having an organ transplant. Having certain conditions, such as: Being obese. Lead poisoning. Kidney disease. A skin condition called psoriasis. Other factors include: Losing weight too quickly. Being dehydrated. Frequently drinking alcohol, especially beer. Frequently drinking beverages that are sweetened with a type of sugar called  fructose. What are the signs or symptoms? An attack of acute gout happens quickly. It usually occurs in just one joint. The most common place is the big toe. Attacks often start at night. Other joints that may be affected include joints of the feet, ankle, knee, fingers, wrist, or elbow. Symptoms of this condition may include: Severe pain. Warmth. Swelling. Stiffness. Tenderness. The affected joint may be very painful to touch. Shiny, red, or purple skin. Chills and fever. Chronic gout may cause symptoms more frequently. More joints may be involved. You may also have white or yellow lumps (tophi) on your hands or feet or in other areas near your joints. How is this diagnosed? This condition is diagnosed based on your symptoms, your medical history, and a physical exam. You may have tests, such as: Blood tests to measure uric acid levels. Removal of joint fluid with a thin needle (aspiration) to look for uric acid crystals. X-rays to look for joint damage. How is this treated? Treatment for this condition has two phases: treating an acute attack and preventing future attacks. Acute gout treatment may include medicines to reduce pain and swelling, including: NSAIDs, such as ibuprofen. Steroids. These are strong anti-inflammatory medicines that can be taken by mouth (orally) or injected into a joint. Colchicine. This medicine relieves pain and swelling when it is taken soon after an attack. It can be given by mouth or through an IV. Preventive treatment may include: Daily use of smaller doses of NSAIDs or colchicine. Use of a medicine that reduces uric acid levels in your blood, such as allopurinol. Changes to your diet. You may need to see   a dietitian about what to eat and drink to prevent gout. Follow these instructions at home: During a gout attack  If directed, put ice on the affected area. To do this: Put ice in a plastic bag. Place a towel between your skin and the bag. Leave the  ice on for 20 minutes, 2-3 times a day. Remove the ice if your skin turns bright red. This is very important. If you cannot feel pain, heat, or cold, you have a greater risk of damage to the area. Raise (elevate) the affected joint above the level of your heart as often as possible. Rest the joint as much as possible. If the affected joint is in your leg, you may be given crutches to use. Follow instructions from your health care provider about eating or drinking restrictions. Avoiding future gout attacks Follow a low-purine diet as told by your dietitian or health care provider. Avoid foods and drinks that are high in purines, including liver, kidney, anchovies, asparagus, herring, mushrooms, mussels, and beer. Maintain a healthy weight or lose weight if you are overweight. If you want to lose weight, talk with your health care provider. Do not lose weight too quickly. Start or maintain an exercise program as told by your health care provider. Eating and drinking Avoid drinking beverages that contain fructose. Drink enough fluids to keep your urine pale yellow. If you drink alcohol: Limit how much you have to: 0-1 drink a day for women who are not pregnant. 0-2 drinks a day for men. Know how much alcohol is in a drink. In the [place] one drink equals one 12 oz bottle of beer (355 mL), one 5 oz glass of wine (148 mL), or one 1 oz glass of hard liquor (44 mL). General instructions Take over-the-counter and prescription medicines only as told by your health care provider. Ask your health care provider if the medicine prescribed to you requires you to avoid driving or using machinery. Return to your normal activities as told by your health care provider. Ask your health care provider what activities are safe for you. Keep all follow-up visits. This is important. Where to find more information National Institutes of Health: www.niams.nih.gov Contact a health care provider if you have: Another  gout attack. Continuing symptoms of a gout attack after 10 days of treatment. Side effects from your medicines. Chills or a fever. Burning pain when you urinate. Pain in your lower back or abdomen. Get help right away if you: Have severe or uncontrolled pain. Cannot urinate. Summary Gout is painful swelling of the joints caused by having too much uric acid in the body. The most common site for gout to occur is in the big toe, but it can affect other joints in the body. Medicines and dietary changes can help to prevent and treat gout attacks. This information is not intended to replace advice given to you by your health care provider. Make sure you discuss any questions you have with your health care provider. Document Revised: 12/19/2020 Document Reviewed: 12/19/2020 Elsevier Patient Education  2023 [employer]  

## 2022-01-07 NOTE — Progress Notes (Signed)
   Subjective:    Patient ID: Sonya Cook, female    DOB: 1945-05-18, 76 y.o.   MRN: [id number]  Chief Complaint  Patient presents with   Gout    Right foot     HPI PT presents to the office today with right great toe redness and swelling. She reports three days ago she noticed some tenderness, however, the pain has worsen to 5 out 10. She has hx of gout.    Review of Systems  All other systems reviewed and are negative.      Objective:   Physical Exam Vitals reviewed.  Constitutional:      General: She is not in acute distress.    Appearance: She is well-developed.  HENT:     Head: Normocephalic and atraumatic.     Right Ear: Tympanic membrane normal.     Left Ear: Tympanic membrane normal.  Eyes:     Pupils: Pupils are equal, round, and reactive to light.  Neck:     Thyroid: No thyromegaly.  Cardiovascular:     Rate and Rhythm: Normal rate and regular rhythm.     Heart sounds: Normal heart sounds. No murmur heard. Pulmonary:     Effort: Pulmonary effort is normal. No respiratory distress.     Breath sounds: Normal breath sounds. No wheezing.  Abdominal:     General: Bowel sounds are normal. There is no distension.     Palpations: Abdomen is soft.     Tenderness: There is no abdominal tenderness.  Musculoskeletal:        General: Tenderness (right great toe swelling, erythemas, and tenderness) present. Normal range of motion.     Cervical back: Normal range of motion and neck supple.  Skin:    General: Skin is warm and dry.  Neurological:     Mental Status: She is alert and oriented to person, place, and time.     Cranial Nerves: No cranial nerve deficit.     Deep Tendon Reflexes: Reflexes are normal and symmetric.  Psychiatric:        Behavior: Behavior normal.        Thought Content: Thought content normal.        Judgment: Judgment normal.     BP (!) 137/55   Pulse 88   Temp 98 F (36.7 C) (Temporal)   Ht 5' 1.5" (1.562 m)   Wt 154 lb 12.8 oz  (70.2 kg)   SpO2 100%   BMI 28.78 kg/m        Assessment & Plan:  Sonya Cook comes in today with chief complaint of Gout (Right foot )   Diagnosis and orders addressed:  1. Need for immunization against influenza - Flu Vaccine QUAD High Dose(Fluad)  2. Acute gout involving toe of right foot, unspecified cause Force fluids Start colchicine today Prednisone  Low purine diet Keep follow up next month, will plan to start allopurinol at that visit.  - colchicine 0.6 MG tablet; Take 2 tabs immediately, then 1 tab twice per day for the duration of the flare up to a max of 7 days  Dispense: 21 tablet; Refill: 0 - predniSONE (DELTASONE) 20 MG tablet; Take 2 tablets (40 mg total) by mouth daily with breakfast for 5 days.  Dispense: 10 tablet; Refill: 0   Evelina Dun, FNP

## 2022-01-09 ENCOUNTER — Telehealth: Payer: Self-pay | Admitting: Family

## 2022-01-09 MED ORDER — TRESIBA FLEXTOUCH 200 UNIT/ML ~~LOC~~ SOPN: 8.0000 [IU] | PEN_INJECTOR | Freq: Every day | SUBCUTANEOUS | 0 refills | Status: DC

## 2022-01-09 NOTE — Telephone Encounter (Signed)
Pt is taking prednisone and sugar is at 374. Needs advise on what to do.

## 2022-01-09 NOTE — Telephone Encounter (Signed)
Tresiba insulin sent to pharmacy. 8 units daily. Strict low carb diet. Glucose will decrease once you complete steroids. Then can stop insulin.

## 2022-01-10 NOTE — Telephone Encounter (Signed)
Patient aware and verbalized understanding. °

## 2022-01-14 DIAGNOSIS — M5416 Radiculopathy, lumbar region: Secondary | ICD-10-CM | POA: Diagnosis not present

## 2022-01-14 DIAGNOSIS — M5136 Other intervertebral disc degeneration, lumbar region: Secondary | ICD-10-CM | POA: Diagnosis not present

## 2022-01-14 DIAGNOSIS — G894 Chronic pain syndrome: Secondary | ICD-10-CM | POA: Diagnosis not present

## 2022-01-16 ENCOUNTER — Other Ambulatory Visit: Payer: Self-pay | Admitting: *Deleted

## 2022-01-16 DIAGNOSIS — I70223 Atherosclerosis of native arteries of extremities with rest pain, bilateral legs: Secondary | ICD-10-CM

## 2022-02-03 ENCOUNTER — Ambulatory Visit: Payer: Medicare Other | Admitting: Neurology

## 2022-02-03 ENCOUNTER — Ambulatory Visit: Payer: Medicare Other | Admitting: Family

## 2022-02-04 ENCOUNTER — Ambulatory Visit (INDEPENDENT_AMBULATORY_CARE_PROVIDER_SITE_OTHER): Payer: Medicare Other

## 2022-02-04 ENCOUNTER — Ambulatory Visit (INDEPENDENT_AMBULATORY_CARE_PROVIDER_SITE_OTHER): Payer: Medicare Other | Admitting: Family

## 2022-02-04 ENCOUNTER — Encounter: Payer: Self-pay | Admitting: Family

## 2022-02-04 VITALS — BP 122/72 | HR 92 | Temp 97.0°F | Ht 61.5 in | Wt 150.0 lb

## 2022-02-04 DIAGNOSIS — E785 Hyperlipidemia, unspecified: Secondary | ICD-10-CM

## 2022-02-04 DIAGNOSIS — K219 Gastro-esophageal reflux disease without esophagitis: Secondary | ICD-10-CM

## 2022-02-04 DIAGNOSIS — N1831 Chronic kidney disease, stage 3a: Secondary | ICD-10-CM

## 2022-02-04 DIAGNOSIS — M85851 Other specified disorders of bone density and structure, right thigh: Secondary | ICD-10-CM | POA: Diagnosis not present

## 2022-02-04 DIAGNOSIS — Z780 Asymptomatic menopausal state: Secondary | ICD-10-CM | POA: Diagnosis not present

## 2022-02-04 DIAGNOSIS — K5903 Drug induced constipation: Secondary | ICD-10-CM

## 2022-02-04 DIAGNOSIS — M8580 Other specified disorders of bone density and structure, unspecified site: Secondary | ICD-10-CM

## 2022-02-04 DIAGNOSIS — I700 Atherosclerosis of aorta: Secondary | ICD-10-CM

## 2022-02-04 DIAGNOSIS — F411 Generalized anxiety disorder: Secondary | ICD-10-CM

## 2022-02-04 DIAGNOSIS — E663 Overweight: Secondary | ICD-10-CM

## 2022-02-04 DIAGNOSIS — F331 Major depressive disorder, recurrent, moderate: Secondary | ICD-10-CM | POA: Diagnosis not present

## 2022-02-04 DIAGNOSIS — M85852 Other specified disorders of bone density and structure, left thigh: Secondary | ICD-10-CM | POA: Diagnosis not present

## 2022-02-04 DIAGNOSIS — N183 Chronic kidney disease, stage 3 unspecified: Secondary | ICD-10-CM

## 2022-02-04 DIAGNOSIS — I10 Essential (primary) hypertension: Secondary | ICD-10-CM

## 2022-02-04 DIAGNOSIS — M545 Low back pain, unspecified: Secondary | ICD-10-CM | POA: Diagnosis not present

## 2022-02-04 DIAGNOSIS — M5136 Other intervertebral disc degeneration, lumbar region: Secondary | ICD-10-CM

## 2022-02-04 DIAGNOSIS — E1122 Type 2 diabetes mellitus with diabetic chronic kidney disease: Secondary | ICD-10-CM | POA: Diagnosis not present

## 2022-02-04 DIAGNOSIS — M1A9XX0 Chronic gout, unspecified, without tophus (tophi): Secondary | ICD-10-CM

## 2022-02-04 DIAGNOSIS — M8588 Other specified disorders of bone density and structure, other site: Secondary | ICD-10-CM

## 2022-02-04 DIAGNOSIS — G8929 Other chronic pain: Secondary | ICD-10-CM

## 2022-02-04 LAB — BAYER DCA HB A1C WAIVED: HB A1C (BAYER DCA - WAIVED): 7 % — ABNORMAL HIGH (ref 4.8–5.6)

## 2022-02-04 MED ORDER — DULOXETINE HCL 60 MG PO CPEP: ORAL_CAPSULE | ORAL | 2 refills | Status: DC

## 2022-02-04 MED ORDER — LUBIPROSTONE 8 MCG PO CAPS: 8.0000 ug | ORAL_CAPSULE | Freq: Two times a day (BID) | ORAL | 1 refills | Status: DC

## 2022-02-04 MED ORDER — ONDANSETRON HCL 4 MG PO TABS: ORAL_TABLET | ORAL | 1 refills | Status: DC

## 2022-02-04 MED ORDER — ALLOPURINOL 100 MG PO TABS: 100.0000 mg | ORAL_TABLET | Freq: Every day | ORAL | 1 refills | Status: DC

## 2022-02-04 MED ORDER — ATORVASTATIN CALCIUM 40 MG PO TABS: 40.0000 mg | ORAL_TABLET | Freq: Every evening | ORAL | 3 refills | Status: DC

## 2022-02-04 NOTE — Patient Instructions (Signed)
Gout  Gout is a condition that causes painful swelling of the joints. Gout is a type of inflammation of the joints (arthritis). This condition is caused by having too much uric acid in the body. Uric acid is a chemical that forms when the body breaks down substances called purines. Purines are important for building body proteins. When the body has too much uric acid, sharp crystals can form and build up inside the joints. This causes pain and swelling. Gout attacks can happen quickly and may be very painful (acute gout). Over time, the attacks can affect more joints and become more frequent (chronic gout). Gout can also cause uric acid to build up under the skin and inside the kidneys. What are the causes? This condition is caused by too much uric acid in your blood. This can happen because: Your kidneys do not remove enough uric acid from your blood. This is the most common cause. Your body makes too much uric acid. This can happen with some cancers and cancer treatments. It can also occur if your body is breaking down too many red blood cells (hemolytic anemia). You eat too many foods that are high in purines. These foods include organ meats and some seafood. Alcohol, especially beer, is also high in purines. A gout attack may be triggered by trauma or stress. What increases the risk? The following factors may make you more likely to develop this condition: Having a family history of gout. Being female and middle-aged. Being female and having gone through menopause. Taking certain medicines, including aspirin, cyclosporine, diuretics, levodopa, and niacin. Having an organ transplant. Having certain conditions, such as: Being obese. Lead poisoning. Kidney disease. A skin condition called psoriasis. Other factors include: Losing weight too quickly. Being dehydrated. Frequently drinking alcohol, especially beer. Frequently drinking beverages that are sweetened with a type of sugar called  fructose. What are the signs or symptoms? An attack of acute gout happens quickly. It usually occurs in just one joint. The most common place is the big toe. Attacks often start at night. Other joints that may be affected include joints of the feet, ankle, knee, fingers, wrist, or elbow. Symptoms of this condition may include: Severe pain. Warmth. Swelling. Stiffness. Tenderness. The affected joint may be very painful to touch. Shiny, red, or purple skin. Chills and fever. Chronic gout may cause symptoms more frequently. More joints may be involved. You may also have white or yellow lumps (tophi) on your hands or feet or in other areas near your joints. How is this diagnosed? This condition is diagnosed based on your symptoms, your medical history, and a physical exam. You may have tests, such as: Blood tests to measure uric acid levels. Removal of joint fluid with a thin needle (aspiration) to look for uric acid crystals. X-rays to look for joint damage. How is this treated? Treatment for this condition has two phases: treating an acute attack and preventing future attacks. Acute gout treatment may include medicines to reduce pain and swelling, including: NSAIDs, such as ibuprofen. Steroids. These are strong anti-inflammatory medicines that can be taken by mouth (orally) or injected into a joint. Colchicine. This medicine relieves pain and swelling when it is taken soon after an attack. It can be given by mouth or through an IV. Preventive treatment may include: Daily use of smaller doses of NSAIDs or colchicine. Use of a medicine that reduces uric acid levels in your blood, such as allopurinol. Changes to your diet. You may need to see   a dietitian about what to eat and drink to prevent gout. Follow these instructions at home: During a gout attack  If directed, put ice on the affected area. To do this: Put ice in a plastic bag. Place a towel between your skin and the bag. Leave the  ice on for 20 minutes, 2-3 times a day. Remove the ice if your skin turns bright red. This is very important. If you cannot feel pain, heat, or cold, you have a greater risk of damage to the area. Raise (elevate) the affected joint above the level of your heart as often as possible. Rest the joint as much as possible. If the affected joint is in your leg, you may be given crutches to use. Follow instructions from your health care provider about eating or drinking restrictions. Avoiding future gout attacks Follow a low-purine diet as told by your dietitian or health care provider. Avoid foods and drinks that are high in purines, including liver, kidney, anchovies, asparagus, herring, mushrooms, mussels, and beer. Maintain a healthy weight or lose weight if you are overweight. If you want to lose weight, talk with your health care provider. Do not lose weight too quickly. Start or maintain an exercise program as told by your health care provider. Eating and drinking Avoid drinking beverages that contain fructose. Drink enough fluids to keep your urine pale yellow. If you drink alcohol: Limit how much you have to: 0-1 drink a day for women who are not pregnant. 0-2 drinks a day for men. Know how much alcohol is in a drink. In the [place] one drink equals one 12 oz bottle of beer (355 mL), one 5 oz glass of wine (148 mL), or one 1 oz glass of hard liquor (44 mL). General instructions Take over-the-counter and prescription medicines only as told by your health care provider. Ask your health care provider if the medicine prescribed to you requires you to avoid driving or using machinery. Return to your normal activities as told by your health care provider. Ask your health care provider what activities are safe for you. Keep all follow-up visits. This is important. Where to find more information National Institutes of Health: www.niams.nih.gov Contact a health care provider if you have: Another  gout attack. Continuing symptoms of a gout attack after 10 days of treatment. Side effects from your medicines. Chills or a fever. Burning pain when you urinate. Pain in your lower back or abdomen. Get help right away if you: Have severe or uncontrolled pain. Cannot urinate. Summary Gout is painful swelling of the joints caused by having too much uric acid in the body. The most common site for gout to occur is in the big toe, but it can affect other joints in the body. Medicines and dietary changes can help to prevent and treat gout attacks. This information is not intended to replace advice given to you by your health care provider. Make sure you discuss any questions you have with your health care provider. Document Revised: 12/19/2020 Document Reviewed: 12/19/2020 Elsevier Patient Education  2023 [employer]  

## 2022-02-04 NOTE — Progress Notes (Signed)
Subjective:    Patient ID: Sonya Cook, female    DOB: 1945/12/27, 76 y.o.   MRN: [id number]  Chief Complaint  Patient presents with   Medical Management of Chronic Issues   Constipation   PT presents to the office today for chronic follow up. PT is followed by Pain Management for chronic back pain monthly. She states her pain management provider needs a letter stating she is medically stable.    She has aortic atherosclerosis and takes Lipitor daily.    She has CKD and avoids NSAID's.    Has aortic atherosclerosis and taking Lipitor 40 mg daily. She is followed by Vascular for PAD. She had aortogram of right lower leg on 12/27/21. Reports her right leg pain is a 0.  She has had three gout flare ups in the last year. Requesting medications she takes daily.  Constipation This is a chronic problem. The current episode started more than 1 year ago. The problem is unchanged. Her stool frequency is 2 to 3 times per week. Associated symptoms include back pain. She has tried stool softeners, laxatives and diet changes for the symptoms. The treatment provided mild relief.  Hypertension This is a chronic problem. The current episode started more than 1 year ago. The problem has been resolved since onset. The problem is controlled. Associated symptoms include shortness of breath ("some times"). Pertinent negatives include no blurred vision, malaise/fatigue or peripheral edema. Risk factors for coronary artery disease include dyslipidemia, obesity and sedentary lifestyle. The current treatment provides moderate improvement.  Gastroesophageal Reflux She reports no belching or no heartburn. This is a chronic problem. The current episode started more than 1 year ago. The problem occurs occasionally. She has tried a PPI for the symptoms. The treatment provided moderate relief.  Diabetes She presents for her follow-up diabetic visit. She has type 2 diabetes mellitus. Pertinent negatives for diabetes  include no blurred vision and no foot paresthesias. Diabetic complications include nephropathy. Pertinent negatives for diabetic complications include no peripheral neuropathy. Risk factors for coronary artery disease include dyslipidemia, diabetes mellitus, hypertension, sedentary lifestyle and post-menopausal. She is following a generally unhealthy diet. Her overall blood glucose range is 140-180 mg/dl. Eye exam is current.  Hyperlipidemia This is a chronic problem. The current episode started more than 1 year ago. The problem is controlled. Recent lipid tests were reviewed and are normal. Exacerbating diseases include obesity. Associated symptoms include shortness of breath ("some times"). Current antihyperlipidemic treatment includes statins. The current treatment provides moderate improvement of lipids. Risk factors for coronary artery disease include diabetes mellitus, dyslipidemia, hypertension, a sedentary lifestyle and post-menopausal.  Depression        This is a chronic problem.  The current episode started more than 1 year ago.   Associated symptoms include sad.  Associated symptoms include no helplessness, no hopelessness and no decreased interest.  Past treatments include SNRIs - Serotonin and norepinephrine reuptake inhibitors. Back Pain This is a chronic problem. The current episode started more than 1 year ago. The problem occurs intermittently. The problem has been waxing and waning since onset. The pain is present in the lumbar spine. The quality of the pain is described as aching. The pain is at a severity of 4/10. The pain is moderate. She has tried bed rest for the symptoms. The treatment provided mild relief.      Review of Systems  Constitutional:  Negative for malaise/fatigue.  Eyes:  Negative for blurred vision.  Respiratory:  Positive for shortness  of breath ("some times").   Gastrointestinal:  Positive for constipation. Negative for heartburn.  Musculoskeletal:  Positive  for back pain.  Psychiatric/Behavioral:  Positive for depression.   All other systems reviewed and are negative.      Objective:   Physical Exam Vitals reviewed.  Constitutional:      General: She is not in acute distress.    Appearance: She is well-developed. She is obese.  HENT:     Head: Normocephalic and atraumatic.     Right Ear: Tympanic membrane normal.     Left Ear: Tympanic membrane normal.  Eyes:     Pupils: Pupils are equal, round, and reactive to light.  Neck:     Thyroid: No thyromegaly.  Cardiovascular:     Rate and Rhythm: Normal rate and regular rhythm.     Heart sounds: Normal heart sounds. No murmur heard. Pulmonary:     Effort: Pulmonary effort is normal. No respiratory distress.     Breath sounds: Normal breath sounds. No wheezing.  Abdominal:     General: Bowel sounds are normal. There is no distension.     Palpations: Abdomen is soft.     Tenderness: There is no abdominal tenderness.  Musculoskeletal:        General: No tenderness. Normal range of motion.     Cervical back: Normal range of motion and neck supple.  Skin:    General: Skin is warm and dry.  Neurological:     Mental Status: She is alert and oriented to person, place, and time.     Cranial Nerves: No cranial nerve deficit.     Deep Tendon Reflexes: Reflexes are normal and symmetric.  Psychiatric:        Behavior: Behavior normal.        Thought Content: Thought content normal.        Judgment: Judgment normal.       BP 122/72   Pulse 92   Temp (!) 97 F (36.1 C) (Temporal)   Ht 5' 1.5" (1.562 m)   Wt 150 lb (68 kg)   SpO2 100%   BMI 27.88 kg/m      Assessment & Plan:  [name] comes in today with chief complaint of Medical Management of Chronic Issues and Constipation   Diagnosis and orders addressed:  1. GAD (generalized anxiety disorder) - DULoxetine (CYMBALTA) 60 MG capsule; TAKE ONE (1) CAPSULE EACH DAY  Dispense: 90 capsule; Refill: 2 - CMP14+EGFR - CBC  with Differential/Platelet  2. Moderate episode of recurrent major depressive disorder (HCC) - DULoxetine (CYMBALTA) 60 MG capsule; TAKE ONE (1) CAPSULE EACH DAY  Dispense: 90 capsule; Refill: 2 - CMP14+EGFR - CBC with Differential/Platelet  3. Osteopenia, unspecified location - CMP14+EGFR - CBC with Differential/Platelet - DG [facility] DEXA  4. Type 2 diabetes mellitus with stage 3 chronic kidney disease, without long-term current use of insulin, unspecified whether stage 3a or 3b CKD ([facility]) - Bayer DCA Hb A1c Waived - CMP14+EGFR - CBC with Differential/Platelet  5. DDD (degenerative disc disease), lumbar - CMP14+EGFR - CBC with Differential/Platelet  6. Stage 3a chronic kidney disease (HCC) - CMP14+EGFR - CBC with Differential/Platelet  7. Primary hypertension - CMP14+EGFR - CBC with Differential/Platelet  8. Overweight (BMI 25.0-29.9) - CMP14+EGFR - CBC with Differential/Platelet  9. Aortic atherosclerosis (HCC) - CMP14+EGFR - CBC with Differential/Platelet  10. Chronic low back pain, unspecified back pain laterality, unspecified whether sciatica present - CMP14+EGFR - CBC with Differential/Platelet  11. Gastroesophageal reflux disease,  unspecified whether esophagitis present - CMP14+EGFR - CBC with Differential/Platelet  12. Hyperlipidemia, unspecified hyperlipidemia type - CMP14+EGFR - CBC with Differential/Platelet - atorvastatin (LIPITOR) 40 MG tablet; Take 1 tablet (40 mg total) by mouth every evening. TAKE ONE (1) TABLET EACH DAY  Dispense: 90 tablet; Refill: 3  13. Post-menopausal - CMP14+EGFR - CBC with Differential/Platelet - DG [facility] DEXA  14. Chronic gout involving toe without tophus, unspecified cause, unspecified laterality Will start allopurinol 100 mg  Low purine diet - allopurinol (ZYLOPRIM) 100 MG tablet; Take 1 tablet (100 mg total) by mouth daily.  Dispense: 90 tablet; Refill: 1  15. Drug-induced constipation Start Amitiza 8 mcg  Force  fluids - lubiprostone (AMITIZA) 8 MCG capsule; Take 1 capsule (8 mcg total) by mouth 2 (two) times daily with a meal.  Dispense: 90 capsule; Refill: 1   Labs pending Health Maintenance reviewed Diet and exercise encouraged  Follow up plan: 6 months   Evelina Dun, FNP

## 2022-02-05 ENCOUNTER — Ambulatory Visit: Payer: Medicare Other | Admitting: Pharmacist

## 2022-02-05 DIAGNOSIS — N183 Chronic kidney disease, stage 3 unspecified: Secondary | ICD-10-CM

## 2022-02-05 DIAGNOSIS — N1831 Chronic kidney disease, stage 3a: Secondary | ICD-10-CM

## 2022-02-05 DIAGNOSIS — E785 Hyperlipidemia, unspecified: Secondary | ICD-10-CM

## 2022-02-05 LAB — CMP14+EGFR
ALT: 23 IU/L (ref 0–32)
AST: 27 IU/L (ref 0–40)
Albumin/Globulin Ratio: 2.2 (ref 1.2–2.2)
Albumin: 4.6 g/dL (ref 3.8–4.8)
Alkaline Phosphatase: 62 IU/L (ref 44–121)
BUN/Creatinine Ratio: 16 (ref 12–28)
BUN: 17 mg/dL (ref 8–27)
Bilirubin Total: 0.4 mg/dL (ref 0.0–1.2)
CO2: 27 mmol/L (ref 20–29)
Calcium: 10.1 mg/dL (ref 8.7–10.3)
Chloride: 102 mmol/L (ref 96–106)
Creatinine, Ser: 1.05 mg/dL — ABNORMAL HIGH (ref 0.57–1.00)
Globulin, Total: 2.1 g/dL (ref 1.5–4.5)
Glucose: 97 mg/dL (ref 70–99)
Potassium: 4.6 mmol/L (ref 3.5–5.2)
Sodium: 144 mmol/L (ref 134–144)
Total Protein: 6.7 g/dL (ref 6.0–8.5)
eGFR: 55 mL/min/{1.73_m2} — ABNORMAL LOW (ref >59)

## 2022-02-05 LAB — CBC WITH DIFFERENTIAL/PLATELET
Basophils Absolute: 0.1 10*3/uL (ref 0.0–0.2)
Basos: 1 %
EOS (ABSOLUTE): 0.1 10*3/uL (ref 0.0–0.4)
Eos: 2 %
Hematocrit: 37.9 % (ref 34.0–46.6)
Hemoglobin: 12.9 g/dL (ref 11.1–15.9)
Immature Grans (Abs): 0 10*3/uL (ref 0.0–0.1)
Immature Granulocytes: 0 %
Lymphocytes Absolute: 1.7 10*3/uL (ref 0.7–3.1)
Lymphs: 24 %
MCH: 31.5 pg (ref 26.6–33.0)
MCHC: 34 g/dL (ref 31.5–35.7)
MCV: 93 fL (ref 79–97)
Monocytes Absolute: 0.6 10*3/uL (ref 0.1–0.9)
Monocytes: 8 %
Neutrophils Absolute: 4.5 10*3/uL (ref 1.4–7.0)
Neutrophils: 65 %
Platelets: 214 10*3/uL (ref 150–450)
RBC: 4.09 x10E6/uL (ref 3.77–5.28)
RDW: 13.1 % (ref 11.7–15.4)
WBC: 7 10*3/uL (ref 3.4–10.8)

## 2022-02-06 ENCOUNTER — Other Ambulatory Visit: Payer: Self-pay

## 2022-02-06 ENCOUNTER — Ambulatory Visit (INDEPENDENT_AMBULATORY_CARE_PROVIDER_SITE_OTHER): Payer: Medicare Other | Admitting: Vascular Surgery

## 2022-02-06 ENCOUNTER — Encounter: Payer: Self-pay | Admitting: Vascular Surgery

## 2022-02-06 ENCOUNTER — Ambulatory Visit (HOSPITAL_COMMUNITY)
Admission: RE | Admit: 2022-02-06 | Discharge: 2022-02-06 | Disposition: A | Discharge status: Home / Self Care | Payer: Medicare Other | Source: Ambulatory Visit | Attending: Vascular Surgery | Admitting: Vascular Surgery

## 2022-02-06 VITALS — BP 163/78 | HR 60 | Temp 97.9°F | Resp 20 | Ht 61.5 in | Wt 151.6 lb

## 2022-02-06 DIAGNOSIS — I70219 Atherosclerosis of native arteries of extremities with intermittent claudication, unspecified extremity: Secondary | ICD-10-CM | POA: Diagnosis not present

## 2022-02-06 DIAGNOSIS — I70223 Atherosclerosis of native arteries of extremities with rest pain, bilateral legs: Secondary | ICD-10-CM | POA: Insufficient documentation

## 2022-02-06 NOTE — Progress Notes (Signed)
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  Her symptoms have improved significantly.  I have encouraged her to continue to walk is much as possible.  She is not a smoker.  She is on aspirin, Plavix, on a statin.  I have recommended follow-up ABIs and a duplex of the right lower extremity in 9 months and I will see her back at that time.  On the left side she has a proximal superficial femoral artery occlusion with reconstitution of the above-knee popliteal artery.  Her vein map shows that her vein is very small and likely not usable on either side.  Given that her symptoms are stable I think we should hold off on considering bypass on the left unless her symptoms progress.  She seems very motivated I suspect she will do fine without surgery.  If her symptoms do progress she would be a good candidate for a left femoral to above-knee popliteal artery bypass.  She has peroneal runoff only.  She would likely require a PTFE graft.   HPI:   Sonya Cook is a pleasant 76 y.o. female who had presented with disabling claudication of both lower extremities and rest pain on the right.  She underwent arteriography on 12/27/2021.  She underwent angioplasty and stenting of the right superficial femoral artery.  She had three-vessel runoff on the right.  On the left side, she had a flush occlusion of the superficial femoral artery at its origin with reconstitution of the above-knee popliteal artery.  There was peroneal runoff on the left only.  ABI in the left was 55%.  She states today that her right leg pain has improved remarkably.  She is having no significant claudication.  She has stable calf claudication on the left.  She tells me that  the other day she was able to walk a mile.  She does not smoke.  She is on aspirin, Plavix, and a statin.  She has no history of rest pain or nonhealing ulcers.  Past Medical History:  Diagnosis Date   Anxiety    Chronic kidney disease    Depression    Diabetes mellitus without complication ([place])    type II    Dyslipidemia    Family history of adverse reaction to anesthesia    daughter - slow to wake up    GERD (gastroesophageal reflux disease)    Hiatal hernia    Hyperlipidemia    Hypertension    Internal hemorrhoids    Osteopenia    PONV (postoperative nausea and vomiting)    Vitamin D deficiency     Family History  Problem Relation Age of Onset   Hodgkin's lymphoma Mother    Atrial fibrillation Mother    Cancer Father        Lungs   Colon cancer Neg Hx    Diabetes Neg Hx    Stomach cancer Neg Hx    Rectal cancer Neg Hx     SOCIAL HISTORY: Social History   Tobacco Use   Smoking status: Former    Types: Cigarettes    Start date: 04/22/2000   Smokeless tobacco: Never  Substance Use Topics   Alcohol use: Yes    Comment: occsional use    Allergies  Allergen Reactions  Codeine Hives   Ibuprofen Other (See Comments)    Stomach bleed   Ozempic (0.25 Or 0.5 Mg-Dose) [Semaglutide(0.25 Or 0.58m-Dos)] Nausea Only   Penicillins Hives    Has patient had a PCN reaction causing immediate rash, facial/tongue/throat swelling, SOB or lightheadedness with hypotension: Yes Has patient had a PCN reaction causing severe rash involving mucus membranes or skin necrosis: No Has patient had a PCN reaction that required hospitalization: No Has patient had a PCN reaction occurring within the last 10 years: No If all of the above answers are "NO", then may proceed with Cephalosporin use.    Jardiance [Empagliflozin]     Felt bad, couldn't get out of bed    Current Outpatient Medications  Medication Sig Dispense Refill   allopurinol (ZYLOPRIM) 100 MG tablet Take 1 tablet (100 mg  total) by mouth daily. 90 tablet 1   aspirin EC 81 MG tablet Take 81 mg by mouth every evening. Swallow whole.     atorvastatin (LIPITOR) 40 MG tablet Take 1 tablet (40 mg total) by mouth every evening. TAKE ONE (1) TABLET EACH DAY 90 tablet 3   busPIRone (BUSPAR) 5 MG tablet Take 5 mg by mouth 3 (three) times daily as needed (anxiety).     Cholecalciferol (VITAMIN D3) 50 MCG (2000 UT) TABS Take 2,000-4,000 Units by mouth See admin instructions. Take 1 tablet (2000 units) by mouth in the morning & take 2 tablets (4000 units) by mouth at night.     clopidogrel (PLAVIX) 75 MG tablet Take 1 tablet (75 mg total) by mouth daily. 30 tablet 11   DULoxetine (CYMBALTA) 60 MG capsule TAKE ONE (1) CAPSULE EACH DAY 90 capsule 2   fentaNYL (DURAGESIC) 75 MCG/HR Place 1 patch onto the skin every other day.     liraglutide (VICTOZA) 18 MG/3ML SOPN Inject 1.8 mg into the skin daily. (Patient taking differently: Inject 1.8 mg into the skin in the morning.) 6 mL 3   lisinopril (ZESTRIL) 10 MG tablet TAKE ONE (1) TABLET EACH DAY (Patient taking differently: Take 10 mg by mouth every evening.) 30 tablet 5   lubiprostone (AMITIZA) 8 MCG capsule Take 1 capsule (8 mcg total) by mouth 2 (two) times daily with a meal. 90 capsule 1   Multiple Vitamin (MULTIVITAMIN) LIQD Take 5 mLs by mouth daily.     NARCAN 4 MG/0.1ML LIQD nasal spray kit Place 1 spray into the nose as needed (accidental overdose).     Omega-3 Fatty Acids (FISH OIL MAXIMUM STRENGTH) 1200 MG CAPS Take 2,400-3,600 mg by mouth See admin instructions. Take 2 capsules (2400 mg) by mouth in the morning & 3 capsules (3600 mg) by mouth in the evening.     Omeprazole-Sodium Bicarbonate (ZEGERID OTC) 20-1100 MG CAPS capsule Take 1 capsule by mouth daily before breakfast.     ondansetron (ZOFRAN) 4 MG tablet TAKE 1 TABLET EVERY 8 HOURS AS NEEDED FOR NAUSEA AND VOMITING 90 tablet 1   Thiamine HCl (B-1 PO) Take 1 tablet by mouth every evening.     UNIFINE PENTIPS 31G X 6  MM MISC      acetaminophen (TYLENOL) 650 MG CR tablet Take 650-1,300 mg by mouth every 8 (eight) hours as needed for pain. (Patient not taking: Reported on 02/04/2022)     colchicine 0.6 MG tablet Take 2 tabs immediately, then 1 tab twice per day for the duration of the flare up to a max of 7 days (Patient not taking: Reported on 02/04/2022) 21 tablet 0  insulin degludec (TRESIBA FLEXTOUCH) 200 UNIT/ML FlexTouch Pen Inject 8 Units into the skin at bedtime. (Patient not taking: Reported on 02/04/2022) 3 mL 0   No current facility-administered medications for this visit.    REVIEW OF SYSTEMS:  _0  denotes positive finding, _1  denotes negative finding Cardiac  Comments:  Chest pain or chest pressure:    Shortness of breath upon exertion:    Short of breath when lying flat:    Irregular heart rhythm:        Vascular    Pain in calf, thigh, or hip brought on by ambulation:    Pain in feet at night that wakes you up from your sleep:     Blood clot in your veins:    Leg swelling:         Pulmonary    Oxygen at home:    Productive cough:     Wheezing:         Neurologic    Sudden weakness in arms or legs:     Sudden numbness in arms or legs:     Sudden onset of difficulty speaking or slurred speech:    Temporary loss of vision in one eye:     Problems with dizziness:         Gastrointestinal    Blood in stool:     Vomited blood:         Genitourinary    Burning when urinating:     Blood in urine:        Psychiatric    Major depression:         Hematologic    Bleeding problems:    Problems with blood clotting too easily:        Skin    Rashes or ulcers:        Constitutional    Fever or chills:     PHYSICAL EXAM:   Vitals:   02/06/22 1356  BP: (!) 163/78  Pulse: 60  Resp: 20  Temp: 97.9 F (36.6 C)  SpO2: 98%  Weight: 151 lb 9.6 oz (68.8 kg)  Height: 5' 1.5" (1.562 m)    GENERAL: The patient is a well-nourished female, in no acute distress. The vital signs  are documented above. CARDIAC: There is a regular rate and rhythm.  VASCULAR: I do not detect carotid bruits. On the right side she has a palpable femoral pulse and posterior tibial pulse and dorsalis pedis pulse. On the left side she has a palpable femoral pulse.  I cannot palpate pedal pulses. PULMONARY: There is good air exchange bilaterally without wheezing or rales. ABDOMEN: Soft and non-tender with normal pitched bowel sounds.  MUSCULOSKELETAL: There are no major deformities or cyanosis. NEUROLOGIC: No focal weakness or paresthesias are detected. SKIN: There are no ulcers or rashes noted. PSYCHIATRIC: The patient has a normal affect.  DATA:      VEIN MAP: I have independently interpreted her vein map today.  On the right side the diameters of the great saphenous vein range from 2.1-3.6 mm in the thigh.  Below the knee the vein is quite small.  On the left side, the diameters of the great saphenous vein in the thigh range from 1.9-2.2 mm.    Deitra Mayo [facility] [phone]

## 2022-02-11 ENCOUNTER — Telehealth: Payer: Self-pay | Admitting: Pharmacist

## 2022-02-11 NOTE — Progress Notes (Signed)
  PHARMD  Follow Up Note   02/05/2022 Name: Sonya Cook MRN: [id number] DOB: 03-22-1946   Referred by: Sharion Balloon, FNP Reason for referral : Chronic Care Management and Medication Assistance ([place])   Please send re-enrollment forms for Victoza 1.'8mg'$  sq DAILY for [place] Patient Assistance to patient.  Please fax PCP portion to [facility] [phone].  Patient will also need pen needles (either selection is okay).  Patient is stable on current dose.  She has tried Ozempic in the past, but would prefer to stay on Victoza (less GI).  Denies personal and family history of Medullary thyroid cancer (MTC).  I will follow up to address elevated lipid panel   Regina Eck, PharmD, BCPS Clinical Pharmacist, [place]  II Phone [phone]

## 2022-02-11 NOTE — Telephone Encounter (Signed)
   PHARMD   Follow Up Note     02/11/2022 Name: Sonya Cook          MRN: [id number]       DOB: Nov 20, 1945     Please send re-enrollment forms for Victoza 1.'8mg'$  sq DAILY for [name] Patient Assistance to patient.  Please fax PCP portion to [facility] [phone]. Patient will also need pen needles (either selection is okay). Patient is stable on current dose.   Will route to [employer], [place] for assistance  Regina Eck, PharmD, BCPS Clinical Pharmacist, [place]  II Phone [phone]

## 2022-02-12 ENCOUNTER — Telehealth: Payer: Self-pay

## 2022-02-12 NOTE — Progress Notes (Signed)
  Chronic Care Management   Note  02/12/2022 Name: Sonya Cook MRN: [id number] DOB: 01-04-1946  Sonya Cook is a 76 y.o. year old female who is a primary care patient of Sharion Balloon, FNP. I reached out to [name] by phone today in response to a referral sent by Sonya Cook's PCP.  Sonya Cook  agreedto scheduling an appointment with the CCM RN Case Manager   Follow up plan: Patient agreed to scheduled appointment with RN Case Manager on 03/03/2022 and Pharm D 03/20/2022.   Noreene Larsson, [place], [place] [address] Direct Dial: [phone] [email or web address]

## 2022-02-17 NOTE — Telephone Encounter (Signed)
Mailed app to pt home

## 2022-03-03 ENCOUNTER — Ambulatory Visit (INDEPENDENT_AMBULATORY_CARE_PROVIDER_SITE_OTHER): Payer: Medicare Other | Admitting: *Deleted

## 2022-03-03 DIAGNOSIS — E785 Hyperlipidemia, unspecified: Secondary | ICD-10-CM

## 2022-03-03 DIAGNOSIS — E1122 Type 2 diabetes mellitus with diabetic chronic kidney disease: Secondary | ICD-10-CM

## 2022-03-03 NOTE — Patient Instructions (Signed)
Please call the care guide team at [phone] if you need to cancel or reschedule your appointment.   If you are experiencing a Mental Health or [place] or need someone to talk to, please call the Suicide and Crisis Lifeline: 988 call the [place] National Suicide Prevention Lifeline: 289-177-8926 or TTY: 352 240 1361 TTY 605-808-7437) to talk to a trained counselor call 1-800-273-TALK (toll free, 24 hour hotline) go to [facility] [phone]) call the [facility]: [phone] call 911   Following is a copy of your full provider care plan:   Goals Addressed             This Visit's Progress    CCM (DIABETES) EXPECTED OUTCOME:  MONITOR, SELF-MANAGE AND REDUCE SYMPTOMS OF DIABETES       Current Barriers:  Knowledge Deficits related to Diabetes management Chronic Disease Management support and education needs related to Diabetes, diet and exercise No Advanced Directives in place- pt requests information be mailed Patient reports she checks CBG once daily with fasting ranges 120-150 and random ranges 160's.  Patient reports she tries to eat healthy and be mindful of carbohydrate intake.  Planned Interventions: Provided education to patient about basic DM disease process; Reviewed medications with patient and discussed importance of medication adherence;        Reviewed prescribed diet with patient carbohydrate modified diet; Discussed plans with patient for ongoing care management follow up and provided patient with direct contact information for care management team;      Provided patient with written educational materials related to hypo and hyperglycemia and importance of correct treatment;       Reviewed scheduled/upcoming provider appointments including: pharmacist 03/20/22;         call provider for findings outside established parameters;       Review of patient status, including  review of consultants reports, relevant laboratory and other test results, and medications completed;       Screening for signs and symptoms of depression related to chronic disease state;        Assessed social determinant of health barriers;         Symptom Management: Take medications as prescribed   Attend all scheduled provider appointments Call pharmacy for medication refills 3-7 days in advance of running out of medications Attend church or other social activities Perform all self care activities independently  Perform IADL's (shopping, preparing meals, housekeeping, managing finances) independently Call provider office for new concerns or questions  keep appointment with eye doctor schedule appointment with eye doctor check blood sugar at prescribed times: per MD order - pt is checking once daily  check feet daily for cuts, sores or redness enter blood sugar readings and medication or insulin into daily log take the blood sugar log to all doctor visits take the blood sugar meter to all doctor visits trim toenails straight across fill half of plate with vegetables limit fast food meals to no more than 1 per week manage portion size read food labels for fat, fiber, carbohydrates and portion size Look over education sent via My Chart - hypoglycemia  Follow Up Plan: Telephone follow up appointment with care management team member scheduled for:  05/07/22 at 9 am       CCM (HYPERLIPIDEMIA) EXPECTED OUTCOME: MONITOR, SELF-MANAGE AND REDUCE SYMPTOMS OF HYPERLIPIDEMIA       Current Barriers:  Knowledge Deficits related to Hyperlipidemia management Chronic Disease Management support and education needs related  to Hyperlipidemia, diet No Advanced Directives in place- pt requests documents be mailed Patient reports she lives with spouse, is independent in all aspects of her care, continues to drive, works full time 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  Patient reports since she had 2 stents placed  in right leg she feels "like I have a new leg" and is trying to walk more (in parking lots, around the house, etc)   Patient reports she tries to eat a healthy diet  Planned Interventions: Provider established cholesterol goals reviewed; Counseled on importance of regular laboratory monitoring as prescribed; Provided HLD educational materials; Reviewed role and benefits of statin for ASCVD risk reduction; Reviewed importance of limiting foods high in cholesterol; Reviewed exercise goals and target of 150 minutes per week; Screening for signs and symptoms of depression related to chronic disease state;  Assessed social determinant of health barriers;  Advanced directives mailed  Symptom Management: Take medications as prescribed   Attend all scheduled provider appointments Call pharmacy for medication refills 3-7 days in advance of running out of medications Attend church or other social activities Perform all self care activities independently  Perform IADL's (shopping, preparing meals, housekeeping, managing finances) independently Call provider office for new concerns or questions  - call for medicine refill 2 or 3 days before it runs out - take all medications exactly as prescribed - call doctor with any symptoms you believe are related to your medicine - call doctor when you experience any new symptoms - go to all doctor appointments as scheduled - adhere to prescribed diet: heart healthy - develop an exercise routine- continue to walk as much as you are able Look over and complete Advanced directive mailed to you  Follow Up Plan: Telephone follow up appointment with care management team member scheduled for:  05/07/21 at 9 am          Patient verbalizes understanding of instructions and care plan provided today and agrees to view in [place]. Active MyChart status and patient understanding of how to access instructions and care plan via MyChart confirmed with patient.      Telephone follow up appointment with care management team member scheduled for:  05/07/22 at 9 am  Heart-Healthy Eating Plan Many factors influence your heart health, including eating and exercise habits. Heart health is also called coronary health. Coronary risk increases with abnormal blood fat (lipid) levels. A heart-healthy eating plan includes limiting unhealthy fats, increasing healthy fats, limiting salt (sodium) intake, and making other diet and lifestyle changes. What is my plan? Your health care provider may recommend that: You limit your fat intake to _________% or less of your total calories each day. You limit your saturated fat intake to _________% or less of your total calories each day. You limit the amount of cholesterol in your diet to less than _________ mg per day. You limit the amount of sodium in your diet to less than _________ mg per day. What are tips for following this plan? Cooking Cook foods using methods other than frying. Baking, boiling, grilling, and broiling are all good options. Other ways to reduce fat include: Removing the skin from poultry. Removing all visible fats from meats. Steaming vegetables in water or broth. Meal planning  At meals, imagine dividing your plate into fourths: Fill one-half of your plate with vegetables and green salads. Fill one-fourth of your plate with whole grains. Fill one-fourth of your plate with lean protein foods. Eat 2-4 cups of vegetables per day. One cup of  vegetables equals 1 cup (91 g) broccoli or cauliflower florets, 2 medium carrots, 1 large bell pepper, 1 large sweet potato, 1 large tomato, 1 medium white potato, 2 cups (150 g) raw leafy greens. Eat 1-2 cups of fruit per day. One cup of fruit equals 1 small apple, 1 large banana, 1 cup (237 g) mixed fruit, 1 large orange,  cup (82 g) dried fruit, 1 cup (240 mL) 100% fruit juice. Eat more foods that contain soluble fiber. Examples include apples, broccoli,  carrots, beans, peas, and barley. Aim to get 25-30 g of fiber per day. Increase your consumption of legumes, nuts, and seeds to 4-5 servings per week. One serving of dried beans or legumes equals  cup (90 g) cooked, 1 serving of nuts is  oz (12 almonds, 24 pistachios, or 7 walnut halves), and 1 serving of seeds equals  oz (8 g). Fats Choose healthy fats more often. Choose monounsaturated and polyunsaturated fats, such as olive and canola oils, avocado oil, flaxseeds, walnuts, almonds, and seeds. Eat more omega-3 fats. Choose salmon, mackerel, sardines, tuna, flaxseed oil, and ground flaxseeds. Aim to eat fish at least 2 times each week. Check food labels carefully to identify foods with trans fats or high amounts of saturated fat. Limit saturated fats. These are found in animal products, such as meats, butter, and cream. Plant sources of saturated fats include palm oil, palm kernel oil, and coconut oil. Avoid foods with partially hydrogenated oils in them. These contain trans fats. Examples are stick margarine, some tub margarines, cookies, crackers, and other baked goods. Avoid fried foods. General information Eat more home-cooked food and less restaurant, buffet, and fast food. Limit or avoid alcohol. Limit foods that are high in added sugar and simple starches such as foods made using white refined flour (white breads, pastries, sweets). Lose weight if you are overweight. Losing just 5-10% of your body weight can help your overall health and prevent diseases such as diabetes and heart disease. Monitor your sodium intake, especially if you have high blood pressure. Talk with your health care provider about your sodium intake. Try to incorporate more vegetarian meals weekly. What foods should I eat? Fruits All fresh, canned (in natural juice), or frozen fruits. Vegetables Fresh or frozen vegetables (raw, steamed, roasted, or grilled). Green salads. Grains Most grains. Choose whole wheat and  whole grains most of the time. Rice and pasta, including brown rice and pastas made with whole wheat. Meats and other proteins Lean, well-trimmed beef, veal, pork, and lamb. Chicken and [place] without skin. All fish and shellfish. Wild duck, rabbit, pheasant, and venison. Egg whites or low-cholesterol egg substitutes. Dried beans, peas, lentils, and tofu. Seeds and most nuts. Dairy Low-fat or nonfat cheeses, including ricotta and mozzarella. Skim or 1% milk (liquid, powdered, or evaporated). Buttermilk made with low-fat milk. Nonfat or low-fat yogurt. Fats and oils Non-hydrogenated (trans-free) margarines. Vegetable oils, including soybean, sesame, sunflower, olive, avocado, peanut, safflower, corn, canola, and cottonseed. Salad dressings or mayonnaise made with a vegetable oil. Beverages Water (mineral or sparkling). Coffee and tea. Unsweetened ice tea. Diet beverages. Sweets and desserts Sherbet, gelatin, and fruit ice. Small amounts of dark chocolate. Limit all sweets and desserts. Seasonings and condiments All seasonings and condiments. The items listed above may not be a complete list of foods and beverages you can eat. Contact a dietitian for more options. What foods should I avoid? Fruits Canned fruit in heavy syrup. Fruit in cream or butter sauce. Fried fruit. Limit coconut.  Vegetables Vegetables cooked in cheese, cream, or butter sauce. Fried vegetables. Grains Breads made with saturated or trans fats, oils, or whole milk. Croissants. Sweet rolls. Donuts. High-fat crackers, such as cheese crackers and chips. Meats and other proteins Fatty meats, such as hot dogs, ribs, sausage, bacon, rib-eye roast or steak. High-fat deli meats, such as salami and bologna. Caviar. Domestic duck and goose. Organ meats, such as liver. Dairy Cream, sour cream, cream cheese, and creamed cottage cheese. Whole-milk cheeses. Whole or 2% milk (liquid, evaporated, or condensed). Whole buttermilk. Cream sauce  or high-fat cheese sauce. Whole-milk yogurt. Fats and oils Meat fat, or shortening. Cocoa butter, hydrogenated oils, palm oil, coconut oil, palm kernel oil. Solid fats and shortenings, including bacon fat, salt pork, lard, and butter. Nondairy cream substitutes. Salad dressings with cheese or sour cream. Beverages Regular sodas and any drinks with added sugar. Sweets and desserts Frosting. Pudding. Cookies. Cakes. Pies. Milk chocolate or white chocolate. Buttered syrups. Full-fat ice cream or ice cream drinks. The items listed above may not be a complete list of foods and beverages to avoid. Contact a dietitian for more information. Summary Heart-healthy meal planning includes limiting unhealthy fats, increasing healthy fats, limiting salt (sodium) intake and making other diet and lifestyle changes. Lose weight if you are overweight. Losing just 5-10% of your body weight can help your overall health and prevent diseases such as diabetes and heart disease. Focus on eating a balance of foods, including fruits and vegetables, low-fat or nonfat dairy, lean protein, nuts and legumes, whole grains, and heart-healthy oils and fats. This information is not intended to replace advice given to you by your health care provider. Make sure you discuss any questions you have with your health care provider. Document Revised: 04/22/2021 Document Reviewed: 04/22/2021 Elsevier Patient Education  [place]. Hypoglycemia Hypoglycemia occurs when the level of sugar (glucose) in the blood is too low. Hypoglycemia can happen in people who have or do not have diabetes. It can develop quickly, and it can be a medical emergency. For most people, a blood glucose level below 70 mg/dL (3.9 mmol/L) is considered hypoglycemia. Glucose is a type of sugar that provides the body's main source of energy. Certain hormones (insulin and glucagon) control the level of glucose in the blood. Insulin lowers blood glucose, and  glucagon raises blood glucose. Hypoglycemia can result from having too much insulin in the bloodstream, or from not eating enough food that contains glucose. You may also have reactive hypoglycemia, which happens within 4 hours after eating a meal. What are the causes? Hypoglycemia occurs most often in people who have diabetes and may be caused by: Diabetes medicine. Not eating enough, or not eating often enough. Increased physical activity. Drinking alcohol on an empty stomach. If you do not have diabetes, hypoglycemia may be caused by: A tumor in the pancreas. Not eating enough, or not eating for long periods at a time (fasting). A severe infection or illness. Problems after having bariatric surgery. Organ failure, such as kidney or liver failure. Certain medicines. What increases the risk? Hypoglycemia is more likely to develop in people who: Have diabetes and take medicines to lower blood glucose. Abuse alcohol. Have a severe illness. What are the signs or symptoms? Symptoms vary depending on whether the condition is mild, moderate, or severe. Mild hypoglycemia Hunger. Sweating and feeling clammy. Dizziness or feeling light-headed. Sleepiness or restless sleep. Nausea. Increased heart rate. Headache. Blurry vision. Mood changes, such as irritability or anxiety. Tingling or  numbness around the mouth, lips, or tongue. Moderate hypoglycemia Confusion and poor judgment. Behavior changes. Weakness. Irregular heartbeat. A change in coordination. Severe hypoglycemia Severe hypoglycemia is a medical emergency. It can cause: Fainting. Seizures. Loss of consciousness (coma). Death. How is this diagnosed? Hypoglycemia is diagnosed with a blood test to measure your blood glucose level. This blood test is done while you are having symptoms. Your health care provider may also do a physical exam and review your medical history. How is this treated? This condition can be treated by  immediately eating or drinking something that contains sugar with 15 grams of fast-acting carbohydrate, such as: 4 oz (120 mL) of fruit juice. 4 oz (120 mL) of regular soda (not diet soda). Several pieces of hard candy. Check food labels to find out how many pieces to eat for 15 grams. 1 Tbsp (15 mL) of sugar or honey. 4 glucose tablets. 1 tube of glucose gel. Treating hypoglycemia if you have diabetes If you are alert and able to swallow safely, follow the 15:15 rule: Take 15 grams of a fast-acting carbohydrate. Talk with your health care provider about how much you should take. Options for getting 15 grams of fast-acting carbohydrate include: Glucose tablets (take 4 tablets). Several pieces of hard candy. Check food labels to find out how many pieces to eat for 15 grams. 4 oz (120 mL) of fruit juice. 4 oz (120 mL) of regular soda (not diet soda). 1 Tbsp (15 mL) of sugar or honey. 1 tube of glucose gel. Check your blood glucose 15 minutes after you take the carbohydrate. If the repeat blood glucose level is still at or below 70 mg/dL (3.9 mmol/L), take 15 grams of a carbohydrate again. If your blood glucose level does not increase above 70 mg/dL (3.9 mmol/L) after 3 tries, seek emergency medical care. After your blood glucose level returns to normal, eat a meal or a snack within 1 hour.  Treating severe hypoglycemia Severe hypoglycemia is when your blood glucose level is below 54 mg/dL (3 mmol/L). Severe hypoglycemia is a medical emergency. Get medical help right away. If you have severe hypoglycemia and you cannot eat or drink, you will need to be given glucagon. A family member or close friend should learn how to check your blood glucose and how to give you glucagon. Ask your health care provider if you need to have an emergency glucagon kit available. Severe hypoglycemia may need to be treated in a hospital. The treatment may include getting glucose through an IV. You may also need  treatment for the cause of your hypoglycemia. Follow these instructions at home:  General instructions Take over-the-counter and prescription medicines only as told by your health care provider. Monitor your blood glucose as told by your health care provider. If you drink alcohol: Limit how much you have to: 0-1 drink a day for women who are not pregnant. 0-2 drinks a day for men. Know how much alcohol is in your drink. In the [place] one drink equals one 12 oz bottle of beer (355 mL), one 5 oz glass of wine (148 mL), or one 1 oz glass of hard liquor (44 mL). Be sure to eat food along with drinking alcohol. Be aware that alcohol is absorbed quickly and may have lingering effects that may result in hypoglycemia later. Be sure to do ongoing glucose monitoring. Keep all follow-up visits. This is important. If you have diabetes: Always have a fast-acting carbohydrate (15 grams) option with you to treat  low blood glucose. Follow your diabetes management plan as directed by your health care provider. Make sure you: Know the symptoms of hypoglycemia. It is important to treat it right away to prevent it from becoming severe. Check your blood glucose as often as told. Always check before and after exercise. Always check your blood glucose before you drive a motorized vehicle. Take your medicines as told. Follow your meal plan. Eat on time, and do not skip meals. Share your diabetes management plan with people in your workplace, school, and household. Carry a medical alert card or wear medical alert jewelry. Where to find more information American Diabetes Association: www.diabetes.org Contact a health care provider if: You have problems keeping your blood glucose in your target range. You have frequent episodes of hypoglycemia. Get help right away if: You continue to have hypoglycemia symptoms after eating or drinking something that contains 15 grams of fast-acting carbohydrate, and you cannot  get your blood glucose above 70 mg/dL (3.9 mmol/L) while following the 15:15 rule. Your blood glucose is below 54 mg/dL (3 mmol/L). You have a seizure. You faint. These symptoms may represent a serious problem that is an emergency. Do not wait to see if the symptoms will go away. Get medical help right away. Call your local emergency services (911 in the [place] Do not drive yourself to the hospital. Summary Hypoglycemia occurs when the level of sugar (glucose) in the blood is too low. Hypoglycemia can happen in people who have or do not have diabetes. It can develop quickly, and it can be a medical emergency. Make sure you know the symptoms of hypoglycemia and how to treat it. Always have a fast-acting carbohydrate option with you to treat low blood sugar. This information is not intended to replace advice given to you by your health care provider. Make sure you discuss any questions you have with your health care provider. Document Revised: 02/16/2020 Document Reviewed: 02/16/2020 Elsevier Patient Education  [place].

## 2022-03-03 NOTE — Chronic Care Management (AMB) (Signed)
Chronic Care Management   CCM RN Visit Note  03/03/2022 Name: Sonya Cook MRN: [id number] DOB: 03/15/1946  Subjective: Sonya Cook is a 76 y.o. year old female who is a primary care patient of Sharion Balloon, FNP. The patient was referred to the Chronic Care Management team for assistance with care management needs subsequent to provider initiation of CCM services and plan of care.    Today's Visit:  Engaged with patient by telephone for initial visit.     SDOH Interventions Today    Flowsheet Row Most Recent Value  SDOH Interventions   Food Insecurity Interventions Intervention Not Indicated  Housing Interventions Intervention Not Indicated  Transportation Interventions Intervention Not Indicated  Utilities Interventions Intervention Not Indicated  Financial Strain Interventions Intervention Not Indicated  Physical Activity Interventions Patient Refused  Stress Interventions Intervention Not Indicated  Social Connections Interventions Intervention Not Indicated         Goals Addressed             This Visit's Progress    CCM (DIABETES) EXPECTED OUTCOME:  MONITOR, SELF-MANAGE AND REDUCE SYMPTOMS OF DIABETES       Current Barriers:  Knowledge Deficits related to Diabetes management Chronic Disease Management support and education needs related to Diabetes, diet and exercise No Advanced Directives in place- pt requests information be mailed Patient reports she checks CBG once daily with fasting ranges 120-150 and random ranges 160's.  Patient reports she tries to eat healthy and be mindful of carbohydrate intake.  Planned Interventions: Provided education to patient about basic DM disease process; Reviewed medications with patient and discussed importance of medication adherence;        Reviewed prescribed diet with patient carbohydrate modified diet; Discussed plans with patient for ongoing care management follow up and provided patient with direct contact  information for care management team;      Provided patient with written educational materials related to hypo and hyperglycemia and importance of correct treatment;       Reviewed scheduled/upcoming provider appointments including: pharmacist 03/20/22;         call provider for findings outside established parameters;       Review of patient status, including review of consultants reports, relevant laboratory and other test results, and medications completed;       Screening for signs and symptoms of depression related to chronic disease state;        Assessed social determinant of health barriers;         Symptom Management: Take medications as prescribed   Attend all scheduled provider appointments Call pharmacy for medication refills 3-7 days in advance of running out of medications Attend church or other social activities Perform all self care activities independently  Perform IADL's (shopping, preparing meals, housekeeping, managing finances) independently Call provider office for new concerns or questions  keep appointment with eye doctor schedule appointment with eye doctor check blood sugar at prescribed times: per MD order - pt is checking once daily  check feet daily for cuts, sores or redness enter blood sugar readings and medication or insulin into daily log take the blood sugar log to all doctor visits take the blood sugar meter to all doctor visits trim toenails straight across fill half of plate with vegetables limit fast food meals to no more than 1 per week manage portion size read food labels for fat, fiber, carbohydrates and portion size Look over education sent via My Chart - hypoglycemia  Follow Up Plan: Telephone  follow up appointment with care management team member scheduled for:  05/07/22 at 9 am       CCM (HYPERLIPIDEMIA) EXPECTED OUTCOME: MONITOR, SELF-MANAGE AND REDUCE SYMPTOMS OF HYPERLIPIDEMIA       Current Barriers:  Knowledge Deficits related to  Hyperlipidemia management Chronic Disease Management support and education needs related to Hyperlipidemia, diet No Advanced Directives in place- pt requests documents be mailed Patient reports she lives with spouse, is independent in all aspects of her care, continues to drive, works full time 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  Patient reports since she had 2 stents placed in right leg she feels "like I have a new leg" and is trying to walk more (in parking lots, around the house, etc)   Patient reports she tries to eat a healthy diet  Planned Interventions: Provider established cholesterol goals reviewed; Counseled on importance of regular laboratory monitoring as prescribed; Provided HLD educational materials; Reviewed role and benefits of statin for ASCVD risk reduction; Reviewed importance of limiting foods high in cholesterol; Reviewed exercise goals and target of 150 minutes per week; Screening for signs and symptoms of depression related to chronic disease state;  Assessed social determinant of health barriers;  Advanced directives mailed  Symptom Management: Take medications as prescribed   Attend all scheduled provider appointments Call pharmacy for medication refills 3-7 days in advance of running out of medications Attend church or other social activities Perform all self care activities independently  Perform IADL's (shopping, preparing meals, housekeeping, managing finances) independently Call provider office for new concerns or questions  - call for medicine refill 2 or 3 days before it runs out - take all medications exactly as prescribed - call doctor with any symptoms you believe are related to your medicine - call doctor when you experience any new symptoms - go to all doctor appointments as scheduled - adhere to prescribed diet: heart healthy - develop an exercise routine- continue to walk as much as you are able Look over and complete Advanced directive mailed to  you  Follow Up Plan: Telephone follow up appointment with care management team member scheduled for:  05/07/21 at 9 am          Plan:Telephone follow up appointment with care management team member scheduled for:  05/07/22 at 9 am  Jacqlyn Larsen [facility], BSN RN Case Manager [place] [phone]

## 2022-03-03 NOTE — Plan of Care (Signed)
Chronic Care Management Provider Comprehensive Care Plan    03/03/2022 Name: Sonya Cook MRN: [id number] DOB: 04-02-1945  Referral to Chronic Care Management (CCM) services was placed by Provider:  Evelina Dun FNP on Date: 02/11/22.  Chronic Condition 1: DIABETES Provider Assessment and Plan Type 2 diabetes mellitus with stage 3 chronic kidney disease, without long-term current use of insulin, unspecified whether stage 3a or 3b CKD (HCC) - Bayer DCA Hb A1c Waived - CMP14+EGFR - CBC with Differential/Platelet   Expected Outcome/Goals Addressed This Visit (Provider CCM goals/Provider Assessment and plan  CCM (DIABETES) EXPECTED OUTCOME:  MONITOR, SELF-MANAGE AND REDUCE SYMPTOMS OF DIABETES  Symptom Management Condition 1: Take medications as prescribed   Attend all scheduled provider appointments Call pharmacy for medication refills 3-7 days in advance of running out of medications Attend church or other social activities Perform all self care activities independently  Perform IADL's (shopping, preparing meals, housekeeping, managing finances) independently Call provider office for new concerns or questions  keep appointment with eye doctor schedule appointment with eye doctor check blood sugar at prescribed times: per MD order - pt is checking once daily  check feet daily for cuts, sores or redness enter blood sugar readings and medication or insulin into daily log take the blood sugar log to all doctor visits take the blood sugar meter to all doctor visits trim toenails straight across fill half of plate with vegetables limit fast food meals to no more than 1 per week manage portion size read food labels for fat, fiber, carbohydrates and portion size Look over education sent via My Chart - hypoglycemia  Chronic Condition 2: HYPERLIPIDEMIA Provider Assessment and Plan  Hyperlipidemia, unspecified hyperlipidemia type - CMP14+EGFR - CBC with Differential/Platelet -  atorvastatin (LIPITOR) 40 MG tablet; Take 1 tablet (40 mg total) by mouth every evening. TAKE ONE (1) TABLET EACH DAY  Dispense: 90 tablet; Refill: 3  Expected Outcome/Goals Addressed This Visit (Provider CCM goals/Provider Assessment and plan  CCM (HYPERLIPIDEMIA) EXPECTED OUTCOME: MONITOR, SELF-MANAGE AND REDUCE SYMPTOMS OF HYPERLIPIDEMIA  Symptom Management Condition 2: Take medications as prescribed   Attend all scheduled provider appointments Call pharmacy for medication refills 3-7 days in advance of running out of medications Attend church or other social activities Perform all self care activities independently  Perform IADL's (shopping, preparing meals, housekeeping, managing finances) independently Call provider office for new concerns or questions  - call for medicine refill 2 or 3 days before it runs out - take all medications exactly as prescribed - call doctor with any symptoms you believe are related to your medicine - call doctor when you experience any new symptoms - go to all doctor appointments as scheduled - adhere to prescribed diet: heart healthy - develop an exercise routine- continue to walk as much as you are able Look over and complete Advanced directive mailed to you  Problem List Patient Active Problem List   Diagnosis Date Noted   Hypertension    Overweight (BMI 25.0-29.9) 01/07/2018   Rotator cuff tear, right 04/30/2017   Aortic atherosclerosis ([place]) 04/09/2017   Chronic back pain 11/13/2016   Stage 3 chronic kidney disease ([place]) 08/20/2015   DDD (degenerative disc disease), lumbar 08/20/2015   GERD (gastroesophageal reflux disease) 11/02/2012   Depression 08/30/2012   Osteopenia 08/30/2012   Diabetes ([place]) 08/30/2012   Hyperlipidemia 08/30/2012    Medication Management  Current Outpatient Medications:    acetaminophen (TYLENOL) 650 MG CR tablet, Take 650-1,300 mg by mouth every 8 (eight) hours  as needed for pain., Disp: , Rfl:    allopurinol  (ZYLOPRIM) 100 MG tablet, Take 1 tablet (100 mg total) by mouth daily., Disp: 90 tablet, Rfl: 1   aspirin EC 81 MG tablet, Take 81 mg by mouth every evening. Swallow whole., Disp: , Rfl:    atorvastatin (LIPITOR) 40 MG tablet, Take 1 tablet (40 mg total) by mouth every evening. TAKE ONE (1) TABLET EACH DAY, Disp: 90 tablet, Rfl: 3   busPIRone (BUSPAR) 5 MG tablet, Take 5 mg by mouth 3 (three) times daily as needed (anxiety)., Disp: , Rfl:    Cholecalciferol (VITAMIN D3) 50 MCG (2000 UT) TABS, Take 2,000-4,000 Units by mouth See admin instructions. Take 1 tablet (2000 units) by mouth in the morning & take 2 tablets (4000 units) by mouth at night., Disp: , Rfl:    clopidogrel (PLAVIX) 75 MG tablet, Take 1 tablet (75 mg total) by mouth daily., Disp: 30 tablet, Rfl: 11   DULoxetine (CYMBALTA) 60 MG capsule, TAKE ONE (1) CAPSULE EACH DAY, Disp: 90 capsule, Rfl: 2   fentaNYL (DURAGESIC) 75 MCG/HR, Place 1 patch onto the skin every other day., Disp: , Rfl:    liraglutide (VICTOZA) 18 MG/3ML SOPN, Inject 1.8 mg into the skin daily. (Patient taking differently: Inject 1.8 mg into the skin in the morning.), Disp: 6 mL, Rfl: 3   lisinopril (ZESTRIL) 10 MG tablet, TAKE ONE (1) TABLET EACH DAY (Patient taking differently: Take 10 mg by mouth every evening.), Disp: 30 tablet, Rfl: 5   lubiprostone (AMITIZA) 8 MCG capsule, Take 1 capsule (8 mcg total) by mouth 2 (two) times daily with a meal., Disp: 90 capsule, Rfl: 1   Multiple Vitamin (MULTIVITAMIN) LIQD, Take 5 mLs by mouth daily., Disp: , Rfl:    NARCAN 4 MG/0.1ML LIQD nasal spray kit, Place 1 spray into the nose as needed (accidental overdose)., Disp: , Rfl:    Omega-3 Fatty Acids (FISH OIL MAXIMUM STRENGTH) 1200 MG CAPS, Take 2,400-3,600 mg by mouth See admin instructions. Take 2 capsules (2400 mg) by mouth in the morning & 3 capsules (3600 mg) by mouth in the evening., Disp: , Rfl:    Omeprazole-Sodium Bicarbonate (ZEGERID OTC) 20-1100 MG CAPS capsule, Take 1  capsule by mouth daily before breakfast., Disp: , Rfl:    ondansetron (ZOFRAN) 4 MG tablet, TAKE 1 TABLET EVERY 8 HOURS AS NEEDED FOR NAUSEA AND VOMITING, Disp: 90 tablet, Rfl: 1   Thiamine HCl (B-1 PO), Take 1 tablet by mouth every evening., Disp: , Rfl:    UNIFINE PENTIPS 31G X 6 MM MISC, , Disp: , Rfl:    colchicine 0.6 MG tablet, Take 2 tabs immediately, then 1 tab twice per day for the duration of the flare up to a max of 7 days (Patient not taking: Reported on 02/04/2022), Disp: 21 tablet, Rfl: 0   insulin degludec (TRESIBA FLEXTOUCH) 200 UNIT/ML FlexTouch Pen, Inject 8 Units into the skin at bedtime. (Patient not taking: Reported on 02/04/2022), Disp: 3 mL, Rfl: 0  Cognitive Assessment Identity Confirmed: : Name; DOB Cognitive Status: Normal   Functional Assessment Hearing Difficulty or Deaf: no Wear Glasses or Blind: no Concentrating, Remembering or Making Decisions Difficulty (CP): no Difficulty Communicating: no Difficulty Eating/Swallowing: no Walking or Climbing Stairs Difficulty: no Dressing/Bathing Difficulty: no Doing Errands Independently Difficulty (such as shopping) (CP): no   Caregiver Assessment  Primary Source of Support/Comfort: spouse Name of Support/Comfort Primary Source: spouse People in Home: spouse Name(s) of People in Home: spouse  Donnamarie Rossetti   Planned Interventions  Provided education to patient about basic DM disease process; Reviewed medications with patient and discussed importance of medication adherence;        Reviewed prescribed diet with patient carbohydrate modified diet; Discussed plans with patient for ongoing care management follow up and provided patient with direct contact information for care management team;      Provided patient with written educational materials related to hypo and hyperglycemia and importance of correct treatment;       Reviewed scheduled/upcoming provider appointments including: pharmacist 03/20/22;         call  provider for findings outside established parameters;       Review of patient status, including review of consultants reports, relevant laboratory and other test results, and medications completed;       Screening for signs and symptoms of depression related to chronic disease state;        Assessed social determinant of health barriers;        Attend all scheduled provider appointments Call pharmacy for medication refills 3-7 days in advance of running out of medications Attend church or other social activities Perform all self care activities independently  Perform IADL's (shopping, preparing meals, housekeeping, managing finances) independently Call provider office for new concerns or questions  - call for medicine refill 2 or 3 days before it runs out - take all medications exactly as prescribed - call doctor with any symptoms you believe are related to your medicine - call doctor when you experience any new symptoms - go to all doctor appointments as scheduled - adhere to prescribed diet: heart healthy - develop an exercise routine- continue to walk as much as you are able Look over and complete Advanced directive mailed to you Interaction and coordination with outside resources, practitioners, and providers See CCM Referral  Care Plan: Available in MyChart

## 2022-03-20 ENCOUNTER — Ambulatory Visit: Payer: Medicare Other | Admitting: Pharmacist

## 2022-03-20 VITALS — BP 130/70 | HR 72

## 2022-03-20 DIAGNOSIS — E1122 Type 2 diabetes mellitus with diabetic chronic kidney disease: Secondary | ICD-10-CM

## 2022-03-20 DIAGNOSIS — N1831 Chronic kidney disease, stage 3a: Secondary | ICD-10-CM

## 2022-03-20 NOTE — Patient Instructions (Signed)
Visit Information  Following are the goals we discussed today:  Current Barriers:  Unable to independently afford treatment regimen Suboptimal therapeutic regimen for T2DM, HLD, CKD  Pharmacist Clinical Goal(s):  Over the next 90 days, patient will verbalize ability to afford treatment regimen adhere to plan to optimize therapeutic regimen for T2DM, HLD, CKD as evidenced by report of adherence to recommended medication management changes through collaboration with PharmD and provider.   Interventions: 1:1 collaboration with Sharion Balloon, FNP regarding development and update of comprehensive plan of care as evidenced by provider attestation and co-signature Inter-disciplinary care team collaboration (see longitudinal plan of care) Comprehensive medication review performed; medication list updated in electronic medical record  Diabetes: Controlled--A1C 7.0%, GFR  55-ckd 3A (WATCH), Current treatment: victoza 1.'2mg'$  daily-->increase to 1.'8mg'$  daily as tolerated (and as needed) Past medications:  Discontinued jardiance (patient reported she felt bad on medication-weak, couldn't get out of bed) Discontinued ozempic due to weakness/fatigue/flu like symptoms (even at very low doses) Denies personal and family history of Medullary thyroid cancer (MTC) Current glucose readings: fasting glucose: <150, post prandial glucose: N/A Denies hypoglycemic/hyperglycemic symptoms Discussed meal planning options and Plate method for healthy eating Avoid sugary drinks and desserts Incorporate balanced protein, non starchy veggies, 1 serving of carbohydrate with each meal Increase water intake Increase physical activity as able Current exercise: N/A Educated on GLP1 Assessed patient finances. NO LONGER NEEDS ASSISTANCE    Patient Goals/Self-Care Activities Over the next 90 days, patient will:  - take medications as prescribed check glucose 3X WEEKLY, document, and provide at future  appointments collaborate with provider on medication access solutions  Follow Up Plan: Telephone follow up appointment with care management team member scheduled for:3 MONTHS   Plan: Telephone follow up appointment with care management team member scheduled for:  3MONTHS  Signature Regina Eck, PharmD, BCPS, [place] Clinical Pharmacist, [place]  II  T [phone]   Please call the care guide team at [phone] if you need to cancel or reschedule your appointment.   The patient verbalized understanding of instructions, educational materials, and care plan provided today and DECLINED offer to receive copy of patient instructions, educational materials, and care plan.

## 2022-03-20 NOTE — Telephone Encounter (Signed)
Pt now over income  Remove from assistance program Thank you!

## 2022-03-20 NOTE — Progress Notes (Signed)
Chronic Care Management Pharmacy Note  03/20/2022 Name:  Sonya Cook MRN:  [id number] DOB:  1946/01/10  Summary:  Diabetes: Controlled--A1C 7.0%, GFR  55-ckd 3A (WATCH), Current treatment: victoza 1.73m daily-->increase to 1.853mdaily as tolerated (and as needed) Past medications:  Discontinued jardiance (patient reported she felt bad on medication-weak, couldn't get out of bed) Discontinued ozempic due to weakness/fatigue/flu like symptoms (even at very low doses) Denies personal and family history of Medullary thyroid cancer (MTC) Current glucose readings: fasting glucose: <150, post prandial glucose: N/A Denies hypoglycemic/hyperglycemic symptoms Discussed meal planning options and Plate method for healthy eating Avoid sugary drinks and desserts Incorporate balanced protein, non starchy veggies, 1 serving of carbohydrate with each meal Increase water intake Increase physical activity as able Current exercise: N/A Educated on GLP1 Assessed patient finances. NO LONGER NEEDS ASSISTANCE    Patient Goals/Self-Care Activities Over the next 90 days, patient will:  - take medications as prescribed check glucose 3X WEEKLY, document, and provide at future appointments collaborate with provider on medication access solutions  Follow Up Plan: Telephone follow up appointment with care management team member scheduled for:3 MONTHS   Subjective: Sonya ANGELLs an [age].o. year old female who is a primary patient of HaSharion BalloonFNP.  The patient was referred to the Chronic Care Management team for assistance with care management needs subsequent to provider initiation of CCM services and plan of care.    Engaged with patient by telephone for follow up visit in response to provider referral for CCM services.   Objective:  LABS:    Lab Results  Component Value Date   CREATININE 1.05 (H) 02/04/2022   CREATININE 1.00 12/27/2021   CREATININE 0.95 08/01/2021     Lab  Results  Component Value Date   HGBA1C 7.0 (H) 02/04/2022         Component Value Date/Time   CHOL 176 05/07/2021 0851   CHOL 140 08/30/2012 1252   TRIG 212 (H) 05/07/2021 0851   TRIG 316 (H) 08/18/2013 0922   TRIG 330 (H) 08/30/2012 1252   HDL 47 05/07/2021 0851   HDL 32 (L) 08/18/2013 0922   HDL 33 (L) 08/30/2012 1252   CHOLHDL 3.7 05/07/2021 0851   LDLCALC 93 05/07/2021 0851   LDLCALC 126 (H) 08/18/2013 0922   LDLCALC 41 08/30/2012 1252     Clinical ASCVD: No   The 10-year ASCVD risk score (Arnett DK, et al., 2019) is: 37.2%   Values used to calculate the score:     Age: [age]     Sex: Female     Is Non-Hispanic African American: No     Diabetic: Yes     Tobacco smoker: No     Systolic Blood Pressure: 13694mHg     Is BP treated: Yes     HDL Cholesterol: 47 mg/dL     Total Cholesterol: 176 mg/dL    Other: (CHADS2VASc if Afib, PHQ9 if depression, MMRC or CAT for COPD, ACT, DEXA)    BP Readings from Last 3 Encounters:  03/20/22 130/70  02/06/22 (!) 163/78  02/04/22 122/72      SDOH:  (Social Determinants of Health) assessments and interventions performed:    Allergies  Allergen Reactions   Codeine Hives   Ibuprofen Other (See Comments)    Stomach bleed   Ozempic (0.25 Or 0.5 Mg-Dose) [Semaglutide(0.25 Or 0.3m33mos)] Nausea Only   Penicillins Hives    Has patient had a PCN reaction causing immediate  rash, facial/tongue/throat swelling, SOB or lightheadedness with hypotension: Yes Has patient had a PCN reaction causing severe rash involving mucus membranes or skin necrosis: No Has patient had a PCN reaction that required hospitalization: No Has patient had a PCN reaction occurring within the last 10 years: No If all of the above answers are "NO", then may proceed with Cephalosporin use.    Jardiance [Empagliflozin]     Felt bad, couldn't get out of bed    Medications Reviewed Today     Reviewed by Lavera Guise, [facility] (Pharmacist) on 03/20/22 at 1132   Med List Status: <None>   Medication Order Taking? Sig Documenting Provider Last Dose Status Informant  acetaminophen (TYLENOL) 650 MG CR tablet [id number] No Take 650-1,300 mg by mouth every 8 (eight) hours as needed for pain. [provider] Taking Active Self  allopurinol (ZYLOPRIM) 100 MG tablet [id number] No Take 1 tablet (100 mg total) by mouth daily. Sharion Balloon, FNP Taking Active   aspirin EC 81 MG tablet [id number] No Take 81 mg by mouth every evening. Swallow whole. [provider] Taking Active Self  atorvastatin (LIPITOR) 40 MG tablet [id number] No Take 1 tablet (40 mg total) by mouth every evening. TAKE ONE (1) TABLET EACH DAY Hawks, Tracy A, FNP Taking Active   busPIRone (BUSPAR) 5 MG tablet [id number] No Take 5 mg by mouth 3 (three) times daily as needed (anxiety). [provider] Taking Active Self  Cholecalciferol (VITAMIN D3) 50 MCG (2000 UT) TABS [id number] No Take 2,000-4,000 Units by mouth See admin instructions. Take 1 tablet (2000 units) by mouth in the morning & take 2 tablets (4000 units) by mouth at night. [provider] Taking Active Self  clopidogrel (PLAVIX) 75 MG tablet [id number] No Take 1 tablet (75 mg total) by mouth daily. Angelia Mould, MD Taking Active   colchicine 0.6 MG tablet [id number] No Take 2 tabs immediately, then 1 tab twice per day for the duration of the flare up to a max of 7 days  Patient not taking: Reported on 02/04/2022   Sharion Balloon, FNP Not Taking Active   DULoxetine (CYMBALTA) 60 MG capsule [id number] No TAKE ONE (1) CAPSULE EACH DAY Hawks, Coolidge A, FNP Taking Active   fentaNYL (DURAGESIC) 75 MCG/HR [id number] No Place 1 patch onto the skin every other day. [provider] Taking Active Self  Patient not taking:  Discontinued 03/20/22 1131 (Completed Course)   liraglutide (VICTOZA) 18 MG/3ML SOPN [id number] No Inject 1.8 mg into the skin daily.  Patient taking differently: Inject 1.8 mg  into the skin in the morning.   Sharion Balloon, FNP Taking Active Self           Med Note LOUKISHA, GUNNERSON Dec 23, 2021 10:53 AM)    lisinopril (ZESTRIL) 10 MG tablet [id number] No TAKE ONE (1) TABLET EACH DAY  Patient taking differently: Take 10 mg by mouth every evening.   Sharion Balloon, FNP Taking Active Self  lubiprostone (AMITIZA) 8 MCG capsule [id number] No Take 1 capsule (8 mcg total) by mouth 2 (two) times daily with a meal. Sharion Balloon, FNP Taking Active   Multiple Vitamin (MULTIVITAMIN) LIQD [id number] No Take 5 mLs by mouth daily. [provider] Taking Active Self  NARCAN 4 MG/0.1ML LIQD nasal spray kit [id number] No Place 1 spray into the nose as needed (accidental overdose). [provider] Taking Active Self  Med Note BRYCELYNN, STAMPLEY Dec 23, 2021 10:54 AM) On hand  Omega-3 Fatty Acids (FISH OIL MAXIMUM STRENGTH) 1200 MG CAPS [id number] No Take 2,400-3,600 mg by mouth See admin instructions. Take 2 capsules (2400 mg) by mouth in the morning & 3 capsules (3600 mg) by mouth in the evening. [provider] Taking Active Self  Omeprazole-Sodium Bicarbonate (ZEGERID OTC) 20-1100 MG CAPS capsule [id number] No Take 1 capsule by mouth daily before breakfast. [provider] Taking Active Self  ondansetron (ZOFRAN) 4 MG tablet [id number] No TAKE 1 TABLET EVERY 8 HOURS AS NEEDED FOR NAUSEA AND VOMITING Evelina Dun A, FNP Taking Active   Thiamine HCl (B-1 PO) [id number] No Take 1 tablet by mouth every evening. [provider] Taking Active Self  Margot Chimes 6 MM [place] [id number] No  [provider] Taking Active Self           Med Note ZOSIA, LUCCHESE Dec 23, 2021 10:56 AM)                Goals Addressed               This Visit's Progress     Patient Stated     T2DM PHARMD GOAL (pt-stated)        Current Barriers:  Unable to independently afford treatment regimen Suboptimal  therapeutic regimen for T2DM, HLD, CKD  Pharmacist Clinical Goal(s):  Over the next 90 days, patient will verbalize ability to afford treatment regimen adhere to plan to optimize therapeutic regimen for T2DM, HLD, CKD as evidenced by report of adherence to recommended medication management changes through collaboration with PharmD and provider.   Interventions: 1:1 collaboration with Sharion Balloon, FNP regarding development and update of comprehensive plan of care as evidenced by provider attestation and co-signature Inter-disciplinary care team collaboration (see longitudinal plan of care) Comprehensive medication review performed; medication list updated in electronic medical record  Diabetes: Controlled--A1C 7.0%, GFR  55-ckd 3A (WATCH), Current treatment: victoza 1.31m daily-->increase to 1.89mdaily as tolerated (and as needed) Past medications:  Discontinued jardiance (patient reported she felt bad on medication-weak, couldn't get out of bed) Discontinued ozempic due to weakness/fatigue/flu like symptoms (even at very low doses) Denies personal and family history of Medullary thyroid cancer (MTC) Current glucose readings: fasting glucose: <150, post prandial glucose: N/A Denies hypoglycemic/hyperglycemic symptoms Discussed meal planning options and Plate method for healthy eating Avoid sugary drinks and desserts Incorporate balanced protein, non starchy veggies, 1 serving of carbohydrate with each meal Increase water intake Increase physical activity as able Current exercise: N/A Educated on GLP1 Assessed patient finances. NO LONGER NEEDS ASSISTANCE    Patient Goals/Self-Care Activities Over the next 90 days, patient will:  - take medications as prescribed check glucose 3X WEEKLY, document, and provide at future appointments collaborate with provider on medication access solutions  Follow Up Plan: Telephone follow up appointment with care management team member scheduled  for:3 MONTHS         Plan: Telephone follow up appointment with care management team member scheduled for:  3 MONTHS     JuRegina EckPharmD, BCPS, BCKirtlandlinical Pharmacist, [place]  T [phone]

## 2022-03-25 ENCOUNTER — Other Ambulatory Visit: Payer: Self-pay | Admitting: Family

## 2022-03-25 DIAGNOSIS — I10 Essential (primary) hypertension: Secondary | ICD-10-CM

## 2022-03-30 DIAGNOSIS — E785 Hyperlipidemia, unspecified: Secondary | ICD-10-CM | POA: Diagnosis not present

## 2022-03-30 DIAGNOSIS — E1169 Type 2 diabetes mellitus with other specified complication: Secondary | ICD-10-CM

## 2022-04-03 ENCOUNTER — Ambulatory Visit: Payer: Medicare Other | Admitting: Neurology

## 2022-04-08 ENCOUNTER — Ambulatory Visit: Payer: Medicare Other | Admitting: Neurology

## 2022-04-21 DIAGNOSIS — M5136 Other intervertebral disc degeneration, lumbar region: Secondary | ICD-10-CM | POA: Diagnosis not present

## 2022-05-07 ENCOUNTER — Ambulatory Visit (INDEPENDENT_AMBULATORY_CARE_PROVIDER_SITE_OTHER): Payer: Medicare Other | Admitting: *Deleted

## 2022-05-07 DIAGNOSIS — E785 Hyperlipidemia, unspecified: Secondary | ICD-10-CM

## 2022-05-07 DIAGNOSIS — N183 Chronic kidney disease, stage 3 unspecified: Secondary | ICD-10-CM

## 2022-05-07 NOTE — Chronic Care Management (AMB) (Signed)
Chronic Care Management   CCM RN Visit Note  05/07/2022 Name: Sonya Cook MRN: [id number] DOB: [birth year]  Subjective: Sonya Cook is a 77 y.o. year old female who is a primary care patient of Sharion Balloon, FNP. The patient was referred to the Chronic Care Management team for assistance with care management needs subsequent to provider initiation of CCM services and plan of care.    Today's Visit:  Engaged with patient by telephone for follow up visit.        Goals Addressed             This Visit's Progress    CCM (DIABETES) EXPECTED OUTCOME:  MONITOR, SELF-MANAGE AND REDUCE SYMPTOMS OF DIABETES       Current Barriers:  Knowledge Deficits related to Diabetes management Chronic Disease Management support and education needs related to Diabetes, diet and exercise No Advanced Directives in place- pt requests information be mailed Patient reports she checks CBG once daily with fasting ranges 120-140 and has not been checking random.  Patient reports she tries to eat healthy and be mindful of carbohydrate intake. Patient reports she wants to start walking again when the weather gets warmer  Planned Interventions: Provided education to patient about basic DM disease process; Reviewed medications with patient and discussed importance of medication adherence;        Reviewed prescribed diet with patient carbohydrate modified diet; Discussed plans with patient for ongoing care management follow up and provided patient with direct contact information for care management team;      Provided patient with written educational materials related to hypo and hyperglycemia and importance of correct treatment;       Reviewed scheduled/upcoming provider appointments including: primary care provider 08/05/22 at 9 am;         Advised patient, providing education and rationale, to check cbg per MD order  and record        call provider for findings outside established parameters;       Review  of patient status, including review of consultants reports, relevant laboratory and other test results, and medications completed;        Symptom Management: Take medications as prescribed   Attend all scheduled provider appointments Call pharmacy for medication refills 3-7 days in advance of running out of medications Attend church or other social activities Perform all self care activities independently  Perform IADL's (shopping, preparing meals, housekeeping, managing finances) independently Call provider office for new concerns or questions  keep appointment with eye doctor schedule appointment with eye doctor check blood sugar at prescribed times: per MD order - (pt is checking once daily)  check feet daily for cuts, sores or redness enter blood sugar readings and medication or insulin into daily log take the blood sugar log to all doctor visits take the blood sugar meter to all doctor visits trim toenails straight across fill half of plate with vegetables limit fast food meals to no more than 1 per week manage portion size prepare main meal at home 3 to 5 days each week read food labels for fat, fiber, carbohydrates and portion size keep feet up while sitting Try to start walking again weather permitting  Follow Up Plan: Telephone follow up appointment with care management team member scheduled for:  08/05/22 at 9 am       CCM (HYPERLIPIDEMIA) EXPECTED OUTCOME: MONITOR, SELF-MANAGE AND REDUCE SYMPTOMS OF HYPERLIPIDEMIA       Current Barriers:  Knowledge Deficits related to Hyperlipidemia  management Chronic Disease Management support and education needs related to Hyperlipidemia, diet No Advanced Directives in place- pt requests documents be mailed Patient reports she lives with spouse, is independent in all aspects of her care, continues to drive, works full time 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  Patient reports since she had 2 stents placed in right leg she feels "like I have a new  leg" and wants to walk more (in parking lots, around the house, etc) but the weather has been too cold, hopes to start back when weather warms up. Patient reports she tries to eat a healthy diet  Planned Interventions: Provider established cholesterol goals reviewed; Counseled on importance of regular laboratory monitoring as prescribed; Reviewed role and benefits of statin for ASCVD risk reduction; Reviewed importance of limiting foods high in cholesterol; Reviewed exercise goals and target of 150 minutes per week; Reinforced importance of adherence to heart healthy diet  Symptom Management: Take medications as prescribed   Attend all scheduled provider appointments Call pharmacy for medication refills 3-7 days in advance of running out of medications Attend church or other social activities Perform all self care activities independently  Perform IADL's (shopping, preparing meals, housekeeping, managing finances) independently Call provider office for new concerns or questions  - call for medicine refill 2 or 3 days before it runs out - take all medications exactly as prescribed - call doctor with any symptoms you believe are related to your medicine - call doctor when you experience any new symptoms - go to all doctor appointments as scheduled - adhere to prescribed diet: heart healthy - develop an exercise routine- continue to walk as much as you are able, weather permitting Follow heart healthy diet- bake or broil foods instead of frying  Follow Up Plan: Telephone follow up appointment with care management team member scheduled for:  08/05/22 at 9 am          Plan:Telephone follow up appointment with care management team member scheduled for:  08/05/22 at 9 am  Jacqlyn Larsen [facility], BSN RN Case Manager [place] [phone]

## 2022-05-07 NOTE — Patient Instructions (Signed)
Please call the care guide team at [phone] if you need to cancel or reschedule your appointment.   If you are experiencing a Mental Health or [place] or need someone to talk to, please call the Suicide and Crisis Lifeline: 988 call the [place] National Suicide Prevention Lifeline: (385)254-4071 or TTY: 330 872 3836 TTY 504-812-8400) to talk to a trained counselor call 1-800-273-TALK (toll free, 24 hour hotline) go to [facility] [address], [place] [phone]) call the [facility]: [phone] call 911   Following is a copy of the CCM Program Consent:  CCM service includes personalized support from designated clinical staff supervised by the physician, including individualized plan of care and coordination with other care providers 24/7 contact phone numbers for assistance for urgent and routine care needs. Service will only be billed when office clinical staff spend 20 minutes or more in a month to coordinate care. Only one practitioner may furnish and bill the service in a calendar month. The patient may stop CCM services at amy time (effective at the end of the month) by phone call to the office staff. The patient will be responsible for cost sharing (co-pay) or up to 20% of the service fee (after annual deductible is met)  Following is a copy of your full provider care plan:   Goals Addressed             This Visit's Progress    CCM (DIABETES) EXPECTED OUTCOME:  MONITOR, SELF-MANAGE AND REDUCE SYMPTOMS OF DIABETES       Current Barriers:  Knowledge Deficits related to Diabetes management Chronic Disease Management support and education needs related to Diabetes, diet and exercise No Advanced Directives in place- pt requests information be mailed Patient reports she checks CBG once daily with fasting ranges 120-140 and has not been checking random.  Patient reports she tries to eat healthy and be  mindful of carbohydrate intake. Patient reports she wants to start walking again when the weather gets warmer  Planned Interventions: Provided education to patient about basic DM disease process; Reviewed medications with patient and discussed importance of medication adherence;        Reviewed prescribed diet with patient carbohydrate modified diet; Discussed plans with patient for ongoing care management follow up and provided patient with direct contact information for care management team;      Provided patient with written educational materials related to hypo and hyperglycemia and importance of correct treatment;       Reviewed scheduled/upcoming provider appointments including: primary care provider 08/05/22 at 9 am;         Advised patient, providing education and rationale, to check cbg per MD order  and record        call provider for findings outside established parameters;       Review of patient status, including review of consultants reports, relevant laboratory and other test results, and medications completed;        Symptom Management: Take medications as prescribed   Attend all scheduled provider appointments Call pharmacy for medication refills 3-7 days in advance of running out of medications Attend church or other social activities Perform all self care activities independently  Perform IADL's (shopping, preparing meals, housekeeping, managing finances) independently Call provider office for new concerns or questions  keep appointment with eye doctor schedule appointment with eye doctor check blood sugar at prescribed times: per MD order - (pt is checking once daily)  check feet daily for cuts,  sores or redness enter blood sugar readings and medication or insulin into daily log take the blood sugar log to all doctor visits take the blood sugar meter to all doctor visits trim toenails straight across fill half of plate with vegetables limit fast food meals to no more  than 1 per week manage portion size prepare main meal at home 3 to 5 days each week read food labels for fat, fiber, carbohydrates and portion size keep feet up while sitting Try to start walking again weather permitting  Follow Up Plan: Telephone follow up appointment with care management team member scheduled for:  08/05/22 at 9 am       CCM (HYPERLIPIDEMIA) EXPECTED OUTCOME: MONITOR, SELF-MANAGE AND REDUCE SYMPTOMS OF HYPERLIPIDEMIA       Current Barriers:  Knowledge Deficits related to Hyperlipidemia management Chronic Disease Management support and education needs related to Hyperlipidemia, diet No Advanced Directives in place- pt requests documents be mailed Patient reports she lives with spouse, is independent in all aspects of her care, continues to drive, works full time 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  Patient reports since she had 2 stents placed in right leg she feels "like I have a new leg" and wants to walk more (in parking lots, around the house, etc) but the weather has been too cold, hopes to start back when weather warms up. Patient reports she tries to eat a healthy diet  Planned Interventions: Provider established cholesterol goals reviewed; Counseled on importance of regular laboratory monitoring as prescribed; Reviewed role and benefits of statin for ASCVD risk reduction; Reviewed importance of limiting foods high in cholesterol; Reviewed exercise goals and target of 150 minutes per week; Reinforced importance of adherence to heart healthy diet  Symptom Management: Take medications as prescribed   Attend all scheduled provider appointments Call pharmacy for medication refills 3-7 days in advance of running out of medications Attend church or other social activities Perform all self care activities independently  Perform IADL's (shopping, preparing meals, housekeeping, managing finances) independently Call provider office for new concerns or questions  - call for  medicine refill 2 or 3 days before it runs out - take all medications exactly as prescribed - call doctor with any symptoms you believe are related to your medicine - call doctor when you experience any new symptoms - go to all doctor appointments as scheduled - adhere to prescribed diet: heart healthy - develop an exercise routine- continue to walk as much as you are able, weather permitting Follow heart healthy diet- bake or broil foods instead of frying  Follow Up Plan: Telephone follow up appointment with care management team member scheduled for:  08/05/22 at 9 am          Patient verbalizes understanding of instructions and care plan provided today and agrees to view in [place]. Active MyChart status and patient understanding of how to access instructions and care plan via MyChart confirmed with patient.     Telephone follow up appointment with care management team member scheduled for:  08/05/22 at 9 am

## 2022-05-19 DIAGNOSIS — Z5181 Encounter for therapeutic drug level monitoring: Secondary | ICD-10-CM | POA: Diagnosis not present

## 2022-05-19 DIAGNOSIS — M5136 Other intervertebral disc degeneration, lumbar region: Secondary | ICD-10-CM | POA: Diagnosis not present

## 2022-05-19 DIAGNOSIS — Z79891 Long term (current) use of opiate analgesic: Secondary | ICD-10-CM | POA: Diagnosis not present

## 2022-05-29 DIAGNOSIS — E1169 Type 2 diabetes mellitus with other specified complication: Secondary | ICD-10-CM | POA: Diagnosis not present

## 2022-05-29 DIAGNOSIS — E785 Hyperlipidemia, unspecified: Secondary | ICD-10-CM

## 2022-06-16 DIAGNOSIS — M5136 Other intervertebral disc degeneration, lumbar region: Secondary | ICD-10-CM | POA: Diagnosis not present

## 2022-06-16 DIAGNOSIS — Z79891 Long term (current) use of opiate analgesic: Secondary | ICD-10-CM | POA: Diagnosis not present

## 2022-06-16 DIAGNOSIS — Z5181 Encounter for therapeutic drug level monitoring: Secondary | ICD-10-CM | POA: Diagnosis not present

## 2022-06-17 ENCOUNTER — Other Ambulatory Visit: Payer: Self-pay | Admitting: Family

## 2022-06-17 DIAGNOSIS — M1A9XX0 Chronic gout, unspecified, without tophus (tophi): Secondary | ICD-10-CM

## 2022-07-14 ENCOUNTER — Telehealth: Payer: Self-pay | Admitting: Family

## 2022-07-14 DIAGNOSIS — E1169 Type 2 diabetes mellitus with other specified complication: Secondary | ICD-10-CM

## 2022-07-14 NOTE — Telephone Encounter (Signed)
  Prescription Request  07/14/2022  Is this a "Controlled Substance" medicine? No   Have you seen your PCP in the last 2 weeks? No [name] has been getting rx for her  If YES, route message to pool  -  If NO, patient needs to be scheduled for appointment.  What is the name of the medication or equipment? victoza  Have you contacted your pharmacy to request a refill? yes  Which pharmacy would you like this sent to? [facility]   Patient notified that their request is being sent to the clinical staff for review and that they should receive a response within 2 business days.

## 2022-07-15 ENCOUNTER — Telehealth: Payer: Self-pay | Admitting: Family

## 2022-07-15 DIAGNOSIS — E1169 Type 2 diabetes mellitus with other specified complication: Secondary | ICD-10-CM

## 2022-07-15 MED ORDER — LIRAGLUTIDE 18 MG/3ML ~~LOC~~ SOPN: 1.8000 mg | PEN_INJECTOR | Freq: Every morning | SUBCUTANEOUS | 4 refills | Status: DC

## 2022-07-15 NOTE — Telephone Encounter (Signed)
Hawks. NTBS in May for 6 mos FU

## 2022-07-15 NOTE — Telephone Encounter (Signed)
Corrected and resent

## 2022-07-15 NOTE — Addendum Note (Signed)
Addended by: Vanice Sarah D on: 07/15/2022 08:17 AM   Modules accepted: Orders

## 2022-07-21 NOTE — Telephone Encounter (Signed)
Patient aware PA sent.

## 2022-07-21 NOTE — Telephone Encounter (Signed)
Pt called to let us know that her Victoza is requiring PA. Pt says she is on her last pen.

## 2022-07-22 MED ORDER — TRULICITY 1.5 MG/0.5ML ~~LOC~~ SOAJ: 1.5000 mg | SUBCUTANEOUS | 2 refills | Status: DC

## 2022-07-22 NOTE — Telephone Encounter (Signed)
Patient aware and verbalized understanding. °

## 2022-07-22 NOTE — Telephone Encounter (Signed)
Trulcity 1.5 mg Prescription sent to pharmacy

## 2022-07-22 NOTE — Telephone Encounter (Signed)
Pt called to let us know that she received a call stating that her insurance had denied the PA for her Victoza Rx. Says she was told that they would approve for her to take Ozempic but pt says that she has tried Ozempic and cannot take it. The other 2 options were Byetta and Trulicity. Pt has not tried either one of those but is open to trying them.

## 2022-08-05 ENCOUNTER — Ambulatory Visit (INDEPENDENT_AMBULATORY_CARE_PROVIDER_SITE_OTHER): Payer: Medicare Other | Admitting: Family

## 2022-08-05 ENCOUNTER — Ambulatory Visit (INDEPENDENT_AMBULATORY_CARE_PROVIDER_SITE_OTHER): Payer: Medicare Other | Admitting: *Deleted

## 2022-08-05 ENCOUNTER — Encounter: Payer: Self-pay | Admitting: Family

## 2022-08-05 VITALS — BP 124/67 | HR 64 | Temp 98.1°F | Ht 61.5 in | Wt 158.4 lb

## 2022-08-05 DIAGNOSIS — M8580 Other specified disorders of bone density and structure, unspecified site: Secondary | ICD-10-CM

## 2022-08-05 DIAGNOSIS — K219 Gastro-esophageal reflux disease without esophagitis: Secondary | ICD-10-CM | POA: Diagnosis not present

## 2022-08-05 DIAGNOSIS — N183 Chronic kidney disease, stage 3 unspecified: Secondary | ICD-10-CM

## 2022-08-05 DIAGNOSIS — Z0001 Encounter for general adult medical examination with abnormal findings: Secondary | ICD-10-CM | POA: Diagnosis not present

## 2022-08-05 DIAGNOSIS — N1831 Chronic kidney disease, stage 3a: Secondary | ICD-10-CM | POA: Diagnosis not present

## 2022-08-05 DIAGNOSIS — E785 Hyperlipidemia, unspecified: Secondary | ICD-10-CM

## 2022-08-05 DIAGNOSIS — I10 Essential (primary) hypertension: Secondary | ICD-10-CM

## 2022-08-05 DIAGNOSIS — E1122 Type 2 diabetes mellitus with diabetic chronic kidney disease: Secondary | ICD-10-CM

## 2022-08-05 DIAGNOSIS — M545 Low back pain, unspecified: Secondary | ICD-10-CM | POA: Diagnosis not present

## 2022-08-05 DIAGNOSIS — G8929 Other chronic pain: Secondary | ICD-10-CM | POA: Diagnosis not present

## 2022-08-05 DIAGNOSIS — I700 Atherosclerosis of aorta: Secondary | ICD-10-CM | POA: Diagnosis not present

## 2022-08-05 DIAGNOSIS — I2510 Atherosclerotic heart disease of native coronary artery without angina pectoris: Secondary | ICD-10-CM

## 2022-08-05 DIAGNOSIS — Z0000 Encounter for general adult medical examination without abnormal findings: Secondary | ICD-10-CM | POA: Diagnosis not present

## 2022-08-05 DIAGNOSIS — E663 Overweight: Secondary | ICD-10-CM

## 2022-08-05 DIAGNOSIS — F331 Major depressive disorder, recurrent, moderate: Secondary | ICD-10-CM

## 2022-08-05 LAB — BAYER DCA HB A1C WAIVED: HB A1C (BAYER DCA - WAIVED): 7.3 % — ABNORMAL HIGH (ref 4.8–5.6)

## 2022-08-05 NOTE — Chronic Care Management (AMB) (Signed)
Chronic Care Management   CCM RN Visit Note  08/05/2022 Name: Sonya Cook MRN: [id number] DOB: 08/04/1945  Subjective: Sonya Cook is a 77 y.o. year old female who is a primary care patient of [name], FNP. The patient was referred to the Chronic Care Management team for assistance with care management needs subsequent to provider initiation of CCM services and plan of care.    Today's Visit:  Engaged with patient by telephone for follow up visit.        Goals Addressed             This Visit's Progress    CCM (DIABETES) EXPECTED OUTCOME:  MONITOR, SELF-MANAGE AND REDUCE SYMPTOMS OF DIABETES       Current Barriers:  Knowledge Deficits related to Diabetes management Chronic Disease Management support and education needs related to Diabetes, diet and exercise No Advanced Directives in place- pt requests information be mailed Patient reports she checks CBG once daily with fasting ranges 130-140 and has not been checking random.  Fasting reading today 177.  Patient reports she tries to eat healthy and be mindful of carbohydrate intake Patient reports she is trying to be careful of what foods she eats because of gout  Planned Interventions: Reinforced carbohydrate modified diet and nutritious food choices Reviewed foods high in purine to limit/ avoid due to diagnosis of gout Reviewed all medications and importance of taking as prescribed Reviewed upcoming scheduled appointments Pain assessment completed Reviewed importance of exercise  Symptom Management: Take medications as prescribed   Attend all scheduled provider appointments Call pharmacy for medication refills 3-7 days in advance of running out of medications Attend church or other social activities Perform all self care activities independently  Perform IADL's (shopping, preparing meals, housekeeping, managing finances) independently Call provider office for new concerns or questions  keep appointment  with eye doctor schedule appointment with eye doctor check blood sugar at prescribed times: per MD order - (pt is checking once daily)  check feet daily for cuts, sores or redness enter blood sugar readings and medication or insulin into daily log take the blood sugar log to all doctor visits take the blood sugar meter to all doctor visits trim toenails straight across fill half of plate with vegetables limit fast food meals to no more than 1 per week manage portion size prepare main meal at home 3 to 5 days each week read food labels for fat, fiber, carbohydrates and portion size set a realistic goal keep feet up while sitting Try to start walking again weather permitting Limit/ avoid foods high in purine for management of gout  Follow Up Plan: Telephone follow up appointment with care management team member scheduled for:   11/06/22 at 9 am       CCM (HYPERLIPIDEMIA) EXPECTED OUTCOME: MONITOR, SELF-MANAGE AND REDUCE SYMPTOMS OF HYPERLIPIDEMIA       Current Barriers:  Knowledge Deficits related to Hyperlipidemia management Chronic Disease Management support and education needs related to Hyperlipidemia, diet No Advanced Directives in place- pt requests documents be mailed Patient reports she lives with spouse, is independent in all aspects of her care, continues to drive, works full time 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  Patient reports since she had 2 stents placed in right leg she feels "like I have a new leg" and wants to walk more (in parking lots, around the house, etc) but the weather has been too cold, hopes to start back now since weather warms up. Patient reports she  tries to eat a healthy diet Patient reports she has blood pressure cuff but does monitor blood pressure  Planned Interventions: Provider established cholesterol goals reviewed; Counseled on importance of regular laboratory monitoring as prescribed; Reviewed role and benefits of statin for ASCVD risk  reduction; Reviewed importance of limiting foods high in cholesterol; Reviewed exercise goals and target of 150 minutes per week; Reinforced importance of adherence to heart healthy diet  Symptom Management: Take medications as prescribed   Attend all scheduled provider appointments Call pharmacy for medication refills 3-7 days in advance of running out of medications Attend church or other social activities Perform all self care activities independently  Perform IADL's (shopping, preparing meals, housekeeping, managing finances) independently Call provider office for new concerns or questions  - call for medicine refill 2 or 3 days before it runs out - take all medications exactly as prescribed - call doctor with any symptoms you believe are related to your medicine - call doctor when you experience any new symptoms - go to all doctor appointments as scheduled - adhere to prescribed diet: heart healthy - develop an exercise routine- continue to walk as much as you are able Follow heart healthy diet- bake or broil foods instead of frying  Follow Up Plan: Telephone follow up appointment with care management team member scheduled for:  11/06/22 at 9 am          Plan:Telephone follow up appointment with care management team member scheduled for:  11/06/22 at 9 am  Irving Shows [facility], BSN RN Case Manager [facility] [phone]

## 2022-08-05 NOTE — Patient Instructions (Signed)
Please call the care guide team at [phone] if you need to cancel or reschedule your appointment.   If you are experiencing a Mental Health or Behavioral Health Crisis or need someone to talk to, please call the Suicide and Crisis Lifeline: 988 call the [place] National Suicide Prevention Lifeline: 207-355-0033 or TTY: 7036395791 TTY 302-796-9034) to talk to a trained counselor call 1-800-273-TALK (toll free, 24 hour hotline) go to [facility] [address], [place] [phone]) call the [facility]: [phone] call 911   Following is a copy of the CCM Program Consent:  CCM service includes personalized support from designated clinical staff supervised by the physician, including individualized plan of care and coordination with other care providers 24/7 contact phone numbers for assistance for urgent and routine care needs. Service will only be billed when office clinical staff spend 20 minutes or more in a month to coordinate care. Only one practitioner may furnish and bill the service in a calendar month. The patient may stop CCM services at amy time (effective at the end of the month) by phone call to the office staff. The patient will be responsible for cost sharing (co-pay) or up to 20% of the service fee (after annual deductible is met)  Following is a copy of your full provider care plan:   Goals Addressed             This Visit's Progress    CCM (DIABETES) EXPECTED OUTCOME:  MONITOR, SELF-MANAGE AND REDUCE SYMPTOMS OF DIABETES       Current Barriers:  Knowledge Deficits related to Diabetes management Chronic Disease Management support and education needs related to Diabetes, diet and exercise No Advanced Directives in place- pt requests information be mailed Patient reports she checks CBG once daily with fasting ranges 130-140 and has not been checking random.  Fasting reading today 177.  Patient reports she  tries to eat healthy and be mindful of carbohydrate intake Patient reports she is trying to be careful of what foods she eats because of gout  Planned Interventions: Reinforced carbohydrate modified diet and nutritious food choices Reviewed foods high in purine to limit/ avoid due to diagnosis of gout Reviewed all medications and importance of taking as prescribed Reviewed upcoming scheduled appointments Pain assessment completed Reviewed importance of exercise  Symptom Management: Take medications as prescribed   Attend all scheduled provider appointments Call pharmacy for medication refills 3-7 days in advance of running out of medications Attend church or other social activities Perform all self care activities independently  Perform IADL's (shopping, preparing meals, housekeeping, managing finances) independently Call provider office for new concerns or questions  keep appointment with eye doctor schedule appointment with eye doctor check blood sugar at prescribed times: per MD order - (pt is checking once daily)  check feet daily for cuts, sores or redness enter blood sugar readings and medication or insulin into daily log take the blood sugar log to all doctor visits take the blood sugar meter to all doctor visits trim toenails straight across fill half of plate with vegetables limit fast food meals to no more than 1 per week manage portion size prepare main meal at home 3 to 5 days each week read food labels for fat, fiber, carbohydrates and portion size set a realistic goal keep feet up while sitting Try to start walking again weather permitting Limit/ avoid foods high in purine for management of gout  Follow Up Plan: Telephone follow up appointment with  care management team member scheduled for:   11/06/22 at 9 am       CCM (HYPERLIPIDEMIA) EXPECTED OUTCOME: MONITOR, SELF-MANAGE AND REDUCE SYMPTOMS OF HYPERLIPIDEMIA       Current Barriers:  Knowledge Deficits related  to Hyperlipidemia management Chronic Disease Management support and education needs related to Hyperlipidemia, diet No Advanced Directives in place- pt requests documents be mailed Patient reports she lives with spouse, is independent in all aspects of her care, continues to drive, works full time 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  Patient reports since she had 2 stents placed in right leg she feels "like I have a new leg" and wants to walk more (in parking lots, around the house, etc) but the weather has been too cold, hopes to start back now since weather warms up. Patient reports she tries to eat a healthy diet Patient reports she has blood pressure cuff but does monitor blood pressure  Planned Interventions: Provider established cholesterol goals reviewed; Counseled on importance of regular laboratory monitoring as prescribed; Reviewed role and benefits of statin for ASCVD risk reduction; Reviewed importance of limiting foods high in cholesterol; Reviewed exercise goals and target of 150 minutes per week; Reinforced importance of adherence to heart healthy diet  Symptom Management: Take medications as prescribed   Attend all scheduled provider appointments Call pharmacy for medication refills 3-7 days in advance of running out of medications Attend church or other social activities Perform all self care activities independently  Perform IADL's (shopping, preparing meals, housekeeping, managing finances) independently Call provider office for new concerns or questions  - call for medicine refill 2 or 3 days before it runs out - take all medications exactly as prescribed - call doctor with any symptoms you believe are related to your medicine - call doctor when you experience any new symptoms - go to all doctor appointments as scheduled - adhere to prescribed diet: heart healthy - develop an exercise routine- continue to walk as much as you are able Follow heart healthy diet- bake or broil  foods instead of frying  Follow Up Plan: Telephone follow up appointment with care management team member scheduled for:  11/06/22 at 9 am          Patient verbalizes understanding of instructions and care plan provided today and agrees to view in MyChart. Active MyChart status and patient understanding of how to access instructions and care plan via MyChart confirmed with patient.  Telephone follow up appointment with care management team member scheduled for: 11/06/22 at 9 am

## 2022-08-05 NOTE — Patient Instructions (Signed)

## 2022-08-05 NOTE — Progress Notes (Signed)
Subjective:    Patient ID: Sonya Cook, female    DOB: 07-17-1945, 77 y.o.   MRN: [id number]  Chief Complaint  Patient presents with   Medical Management of Chronic Issues    6 mnth   PT presents to the office today for  CPE and chronic follow up. PT is followed by Pain Management for chronic back pain monthly. She states her pain management provider needs a letter stating she is medically stable.    She has aortic atherosclerosis and takes Lipitor daily.    She has CKD and avoids NSAID's.    Has aortic atherosclerosis and taking Lipitor 40 mg daily. She is followed by Vascular for PAD. She had aortogram of right lower leg on 12/27/21. Reports her right leg pain is a 0.   She has had three gout flare ups in the last year. Taking allopurinol 100 mg daily.    Hypertension This is a chronic problem. The current episode started more than 1 year ago. The problem has been resolved since onset. The problem is controlled. Associated symptoms include blurred vision. Pertinent negatives include no malaise/fatigue, peripheral edema or shortness of breath. Risk factors for coronary artery disease include dyslipidemia, obesity, diabetes mellitus and sedentary lifestyle. The current treatment provides moderate improvement.  Gastroesophageal Reflux She complains of belching and heartburn. This is a chronic problem. The current episode started more than 1 year ago. The problem occurs occasionally. Pertinent negatives include no fatigue. She has tried a PPI for the symptoms. The treatment provided moderate relief.  Diabetes She has type 2 diabetes mellitus. Associated symptoms include blurred vision and weakness. Pertinent negatives for diabetes include no fatigue and no foot paresthesias. Symptoms are stable. Pertinent negatives for diabetic complications include no peripheral neuropathy. She is following a generally unhealthy diet. Her overall blood glucose range is 140-180 mg/dl.   Hyperlipidemia This is a chronic problem. The current episode started more than 1 year ago. The problem is controlled. Recent lipid tests were reviewed and are normal. Associated symptoms include leg pain. Pertinent negatives include no shortness of breath. Current antihyperlipidemic treatment includes statins. The current treatment provides moderate improvement of lipids. Risk factors for coronary artery disease include dyslipidemia, hypertension, a sedentary lifestyle and post-menopausal.  Depression        This is a chronic problem.  The current episode started more than 1 year ago.   The problem occurs intermittently.  Associated symptoms include sad.  Associated symptoms include no fatigue, no helplessness and no hopelessness.  Past treatments include nothing.  Compliance with treatment is good. Back Pain This is a chronic problem. The current episode started more than 1 year ago. The problem occurs intermittently. The problem has been waxing and waning since onset. The pain is present in the lumbar spine. The quality of the pain is described as aching. The pain is at a severity of 3/10. The pain is moderate. Associated symptoms include leg pain, tingling and weakness. She has tried bed rest and analgesics for the symptoms. The treatment provided mild relief.      Review of Systems  Constitutional:  Negative for fatigue and malaise/fatigue.  Eyes:  Positive for blurred vision.  Respiratory:  Negative for shortness of breath.   Gastrointestinal:  Positive for heartburn.  Musculoskeletal:  Positive for back pain.  Neurological:  Positive for tingling and weakness.  Psychiatric/Behavioral:  Positive for depression.   All other systems reviewed and are negative.      Family History  Problem Relation Age of Onset   Hodgkin's lymphoma Mother    Atrial fibrillation Mother    Cancer Father        Lungs   Colon cancer Neg Hx    Diabetes Neg Hx    Stomach cancer Neg Hx    Rectal cancer  Neg Hx    Social History   Socioeconomic History   Marital status: Married    Spouse name: Casimiro Needle   Number of children: 2   Years of education: Not on file   Highest education level: Some college, no degree  Occupational History   Occupation: Psychologist, 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  Tobacco Use   Smoking status: Former    Types: Cigarettes    Start date: 04/22/2000   Smokeless tobacco: Never  Vaping Use   Vaping Use: Never used  Substance and Sexual Activity   Alcohol use: Yes    Comment: occsional use   Drug use: No   Sexual activity: Not Currently  Other Topics Concern   Not on file  Social History Narrative   Lives home with husband and great granddaughter. Son lives 1 hour away   Social Determinants of Health   Financial Resource Strain: Low Risk  (03/03/2022)   Overall Financial Resource Strain (CARDIA)    Difficulty of Paying Living Expenses: Not hard at all  Food Insecurity: No Food Insecurity (03/03/2022)   Hunger Vital Sign    Worried About Running Out of Food in the Last Year: Never true    Ran Out of Food in the Last Year: Never true  Transportation Needs: No Transportation Needs (03/03/2022)   PRAPARE - Administrator, Civil Service (Medical): No    Lack of Transportation (Non-Medical): No  Physical Activity: Insufficiently Active (03/03/2022)   Exercise Vital Sign    Days of Exercise per Week: 3 days    Minutes of Exercise per Session: 10 min  Stress: No Stress Concern Present (03/03/2022)   [employer] of Occupational Health - Occupational Stress Questionnaire    Feeling of Stress : Only a little  Social Connections: Socially Integrated (03/03/2022)   Social Connection and Isolation Panel [NHANES]    Frequency of Communication with Friends and Family: More than three times a week    Frequency of Social Gatherings with Friends and Family: More than three times a week    Attends  Religious Services: 1 to 4 times per year    Active Member of [employer] or Organizations: Yes    Attends Banker Meetings: 1 to 4 times per year    Marital Status: Married    Objective:   Physical Exam Vitals reviewed.  Constitutional:      General: She is not in acute distress.    Appearance: She is well-developed.  HENT:     Head: Normocephalic and atraumatic.     Right Ear: Tympanic membrane normal.     Left Ear: Tympanic membrane normal.  Eyes:     Pupils: Pupils are equal, round, and reactive to light.  Neck:     Thyroid: No thyromegaly.  Cardiovascular:     Rate and Rhythm: Normal rate and regular rhythm.     Heart sounds: Normal heart sounds. No murmur heard. Pulmonary:     Effort: Pulmonary effort is normal. No respiratory distress.     Breath sounds: Normal breath sounds. No wheezing.  Abdominal:  General: Bowel sounds are normal. There is no distension.     Palpations: Abdomen is soft.     Tenderness: There is no abdominal tenderness.  Musculoskeletal:        General: No tenderness. Normal range of motion.     Cervical back: Normal range of motion and neck supple.  Skin:    General: Skin is warm and dry.  Neurological:     Mental Status: She is alert and oriented to person, place, and time.     Cranial Nerves: No cranial nerve deficit.     Deep Tendon Reflexes: Reflexes are normal and symmetric.  Psychiatric:        Behavior: Behavior normal.        Thought Content: Thought content normal.        Judgment: Judgment normal.    Diabetic Foot Exam - Simple   Simple Foot Form Diabetic Foot exam was performed with the following findings: Yes 08/05/2022 11:41 AM  Visual Inspection No deformities, no ulcerations, no other skin breakdown bilaterally: Yes Sensation Testing Intact to touch and monofilament testing bilaterally: Yes Pulse Check Posterior Tibialis and Dorsalis pulse intact bilaterally: Yes Comments      BP 124/67   Pulse 64   Temp  98.1 F (36.7 C) (Temporal)   Ht 5' 1.5" (1.562 m)   Wt 158 lb 6.4 oz (71.8 kg)   SpO2 100%   BMI 29.44 kg/m      Assessment & Plan:   MYNDI SCHOPF comes in today with chief complaint of Medical Management of Chronic Issues (6 mnth)   Diagnosis and orders addressed:  1. Type 2 diabetes mellitus with stage 3 chronic kidney disease, without long-term current use of insulin, unspecified whether stage 3a or 3b CKD (HCC) - CMP14+EGFR - CBC with Differential/Platelet - Bayer DCA Hb A1c Waived - Microalbumin / creatinine urine ratio  2. Aortic atherosclerosis (HCC) - CMP14+EGFR - CBC with Differential/Platelet  3. Chronic low back pain, unspecified back pain laterality, unspecified whether sciatica present - CMP14+EGFR - CBC with Differential/Platelet  4. Moderate episode of recurrent major depressive disorder (HCC) - CMP14+EGFR - CBC with Differential/Platelet  5. Gastroesophageal reflux disease, unspecified whether esophagitis present - CMP14+EGFR - CBC with Differential/Platelet  6. Stage 3a chronic kidney disease (HCC) - CMP14+EGFR - CBC with Differential/Platelet  7. Overweight (BMI 25.0-29.9)  - CMP14+EGFR - CBC with Differential/Platelet  8. Osteopenia, unspecified location - CMP14+EGFR - CBC with Differential/Platelet  9. Primary hypertension - CMP14+EGFR - CBC with Differential/Platelet  10. Hyperlipidemia, unspecified hyperlipidemia type  - CMP14+EGFR - CBC with Differential/Platelet - Lipid panel  11. Annual physical exam  - CMP14+EGFR - CBC with Differential/Platelet - Bayer DCA Hb A1c Waived - Lipid panel - TSH   Labs pending Health Maintenance reviewed Diet and exercise encouraged  Follow up plan: 3 months   Jannifer Rodney, FNP

## 2022-08-06 LAB — CBC WITH DIFFERENTIAL/PLATELET
Basophils Absolute: 0 10*3/uL (ref 0.0–0.2)
Basos: 1 %
EOS (ABSOLUTE): 0.2 10*3/uL (ref 0.0–0.4)
Eos: 3 %
Hematocrit: 35.3 % (ref 34.0–46.6)
Hemoglobin: 11.9 g/dL (ref 11.1–15.9)
Immature Grans (Abs): 0 10*3/uL (ref 0.0–0.1)
Immature Granulocytes: 0 %
Lymphocytes Absolute: 1.7 10*3/uL (ref 0.7–3.1)
Lymphs: 21 %
MCH: 31.9 pg (ref 26.6–33.0)
MCHC: 33.7 g/dL (ref 31.5–35.7)
MCV: 95 fL (ref 79–97)
Monocytes Absolute: 0.8 10*3/uL (ref 0.1–0.9)
Monocytes: 10 %
Neutrophils Absolute: 5.1 10*3/uL (ref 1.4–7.0)
Neutrophils: 65 %
Platelets: 185 10*3/uL (ref 150–450)
RBC: 3.73 x10E6/uL — ABNORMAL LOW (ref 3.77–5.28)
RDW: 13.7 % (ref 11.7–15.4)
WBC: 7.8 10*3/uL (ref 3.4–10.8)

## 2022-08-06 LAB — CMP14+EGFR
ALT: 16 IU/L (ref 0–32)
AST: 26 IU/L (ref 0–40)
Albumin/Globulin Ratio: 2 (ref 1.2–2.2)
Albumin: 4.3 g/dL (ref 3.8–4.8)
Alkaline Phosphatase: 69 IU/L (ref 44–121)
BUN/Creatinine Ratio: 23 (ref 12–28)
BUN: 22 mg/dL (ref 8–27)
Bilirubin Total: 0.4 mg/dL (ref 0.0–1.2)
CO2: 25 mmol/L (ref 20–29)
Calcium: 9.7 mg/dL (ref 8.7–10.3)
Chloride: 98 mmol/L (ref 96–106)
Creatinine, Ser: 0.96 mg/dL (ref 0.57–1.00)
Globulin, Total: 2.2 g/dL (ref 1.5–4.5)
Glucose: 124 mg/dL — ABNORMAL HIGH (ref 70–99)
Potassium: 4.7 mmol/L (ref 3.5–5.2)
Sodium: 140 mmol/L (ref 134–144)
Total Protein: 6.5 g/dL (ref 6.0–8.5)
eGFR: 62 mL/min/{1.73_m2} (ref >59)

## 2022-08-06 LAB — LIPID PANEL
Chol/HDL Ratio: 3.6 ratio (ref 0.0–4.4)
Cholesterol, Total: 165 mg/dL (ref 100–199)
HDL: 46 mg/dL (ref >39)
LDL Chol Calc (NIH): 89 mg/dL (ref 0–99)
Triglycerides: 175 mg/dL — ABNORMAL HIGH (ref 0–149)
VLDL Cholesterol Cal: 30 mg/dL (ref 5–40)

## 2022-08-06 LAB — TSH: TSH: 3.27 u[IU]/mL (ref 0.450–4.500)

## 2022-08-07 LAB — MICROALBUMIN / CREATININE URINE RATIO
Creatinine, Urine: 95.3 mg/dL
Microalb/Creat Ratio: 7 mg/g creat (ref 0–29)
Microalbumin, Urine: 7 ug/mL

## 2022-08-18 DIAGNOSIS — G894 Chronic pain syndrome: Secondary | ICD-10-CM | POA: Diagnosis not present

## 2022-08-18 DIAGNOSIS — Z79891 Long term (current) use of opiate analgesic: Secondary | ICD-10-CM | POA: Diagnosis not present

## 2022-08-18 DIAGNOSIS — Z5181 Encounter for therapeutic drug level monitoring: Secondary | ICD-10-CM | POA: Diagnosis not present

## 2022-08-18 DIAGNOSIS — M5136 Other intervertebral disc degeneration, lumbar region: Secondary | ICD-10-CM | POA: Diagnosis not present

## 2022-08-29 DIAGNOSIS — E785 Hyperlipidemia, unspecified: Secondary | ICD-10-CM

## 2022-08-29 DIAGNOSIS — E1159 Type 2 diabetes mellitus with other circulatory complications: Secondary | ICD-10-CM

## 2022-09-15 DIAGNOSIS — M5136 Other intervertebral disc degeneration, lumbar region: Secondary | ICD-10-CM | POA: Diagnosis not present

## 2022-09-18 ENCOUNTER — Other Ambulatory Visit: Payer: Self-pay | Admitting: Family

## 2022-09-24 ENCOUNTER — Other Ambulatory Visit: Payer: Self-pay | Admitting: Family

## 2022-09-24 DIAGNOSIS — I10 Essential (primary) hypertension: Secondary | ICD-10-CM

## 2022-10-10 ENCOUNTER — Other Ambulatory Visit: Payer: Self-pay | Admitting: Family

## 2022-10-10 DIAGNOSIS — F411 Generalized anxiety disorder: Secondary | ICD-10-CM

## 2022-10-10 DIAGNOSIS — F331 Major depressive disorder, recurrent, moderate: Secondary | ICD-10-CM

## 2022-10-21 ENCOUNTER — Other Ambulatory Visit: Payer: Self-pay | Admitting: Family

## 2022-10-21 DIAGNOSIS — M1A9XX0 Chronic gout, unspecified, without tophus (tophi): Secondary | ICD-10-CM

## 2022-10-28 ENCOUNTER — Other Ambulatory Visit: Payer: Self-pay | Admitting: Family

## 2022-10-28 DIAGNOSIS — M5136 Other intervertebral disc degeneration, lumbar region: Secondary | ICD-10-CM | POA: Diagnosis not present

## 2022-10-28 DIAGNOSIS — G894 Chronic pain syndrome: Secondary | ICD-10-CM | POA: Diagnosis not present

## 2022-11-06 ENCOUNTER — Telehealth: Payer: Medicare Other

## 2022-11-06 ENCOUNTER — Ambulatory Visit: Payer: Medicare Other | Admitting: Family

## 2022-11-07 ENCOUNTER — Other Ambulatory Visit: Payer: Self-pay | Admitting: Family

## 2022-11-07 DIAGNOSIS — F411 Generalized anxiety disorder: Secondary | ICD-10-CM

## 2022-11-07 DIAGNOSIS — F331 Major depressive disorder, recurrent, moderate: Secondary | ICD-10-CM

## 2022-11-21 ENCOUNTER — Ambulatory Visit: Payer: Medicare Other

## 2022-11-21 VITALS — Ht 62.0 in | Wt 150.0 lb

## 2022-11-21 DIAGNOSIS — Z0000 Encounter for general adult medical examination without abnormal findings: Secondary | ICD-10-CM

## 2022-11-21 NOTE — Progress Notes (Signed)
Subjective:   Sonya Cook is a 77 y.o. female who presents for Medicare Annual (Subsequent) preventive examination.  Visit Complete: Virtual  I connected with  Tonia Brooms Greener on 11/21/22 by a audio enabled telemedicine application and verified that I am speaking with the correct person using two identifiers.  Patient Location: Home  Provider Location: Home Office  I discussed the limitations of evaluation and management by telemedicine. The patient expressed understanding and agreed to proceed.  Patient Medicare AWV questionnaire was completed by the patient on 11/21/2022; I have confirmed that all information answered by patient is correct and no changes since this date.  Review of Systems    Vital Signs: Unable to obtain new vitals due to this being a telehealth visit.  Cardiac Risk Factors include: advanced age (>64men, >53 women);diabetes mellitus;dyslipidemia;hypertension;sedentary lifestyleNutrition Risk Assessment:  Has the patient had any N/V/D within the last 2 months?  No  Does the patient have any non-healing wounds?  No  Has the patient had any unintentional weight loss or weight gain?  No   Diabetes:  Is the patient diabetic?  Yes  If diabetic, was a CBG obtained today?  No  Did the patient bring in their glucometer from home?  No  How often do you monitor your CBG's? Weekly .   Financial Strains and Diabetes Management:  Are you having any financial strains with the device, your supplies or your medication? No .  Does the patient want to be seen by Chronic Care Management for management of their diabetes?  No  Would the patient like to be referred to a Nutritionist or for Diabetic Management?  No   Diabetic Exams:  Diabetic Eye Exam: Completed 11/2021 Diabetic Foot Exam: Overdue, Pt has been advised about the importance in completing this exam. Pt is scheduled for diabetic foot exam on next office visit .      Objective:    Today's Vitals   11/21/22  0938  Weight: 150 lb (68 kg)  Height: 5\' 2"  (1.575 m)   Body mass index is 27.44 kg/m.     11/21/2022    9:42 AM 03/03/2022   11:36 AM 12/27/2021    6:46 AM 11/19/2021   10:18 AM 09/12/2020   10:02 AM 09/12/2019    9:34 AM 09/10/2018    9:48 AM  Advanced Directives  Does Patient Have a Medical Advance Directive? Yes No No No No No No  Type of Estate agent of [place];Living will        Copy of Healthcare Power of Attorney in Chart? No - copy requested        Would patient like information on creating a medical advance directive?  Yes (MAU/Ambulatory/Procedural Areas - Information given) No - Patient declined No - Patient declined Yes (MAU/Ambulatory/Procedural Areas - Information given) No - Patient declined Yes (MAU/Ambulatory/Procedural Areas - Information given)    Current Medications (verified) Outpatient Encounter Medications as of 11/21/2022  Medication Sig   acetaminophen (TYLENOL) 650 MG CR tablet Take 650-1,300 mg by mouth every 8 (eight) hours as needed for pain.   allopurinol (ZYLOPRIM) 100 MG tablet TAKE ONE (1) TABLET BY MOUTH EVERY DAY   aspirin EC 81 MG tablet Take 81 mg by mouth every evening. Swallow whole.   atorvastatin (LIPITOR) 40 MG tablet Take 1 tablet (40 mg total) by mouth every evening. TAKE ONE (1) TABLET EACH DAY   busPIRone (BUSPAR) 5 MG tablet TAKE ONE TABLET 3 TIMES A DAY  AS NEEDED.   Cholecalciferol (VITAMIN D3) 50 MCG (2000 UT) TABS Take 2,000-4,000 Units by mouth See admin instructions. Take 1 tablet (2000 units) by mouth in the morning & take 2 tablets (4000 units) by mouth at night.   clopidogrel (PLAVIX) 75 MG tablet Take 1 tablet (75 mg total) by mouth daily.   colchicine 0.6 MG tablet Take 2 tabs immediately, then 1 tab twice per day for the duration of the flare up to a max of 7 days   DULoxetine (CYMBALTA) 60 MG capsule TAKE ONE (1) CAPSULE EACH DAY   fentaNYL (DURAGESIC) 75 MCG/HR Place 1 patch onto the skin every other day.    lisinopril (ZESTRIL) 10 MG tablet TAKE ONE (1) TABLET EACH DAY   lubiprostone (AMITIZA) 8 MCG capsule Take 1 capsule (8 mcg total) by mouth 2 (two) times daily with a meal.   Multiple Vitamin (MULTIVITAMIN) LIQD Take 5 mLs by mouth daily.   NARCAN 4 MG/0.1ML LIQD nasal spray kit Place 1 spray into the nose as needed (accidental overdose).   Omega-3 Fatty Acids (FISH OIL MAXIMUM STRENGTH) 1200 MG CAPS Take 2,400-3,600 mg by mouth See admin instructions. Take 2 capsules (2400 mg) by mouth in the morning & 3 capsules (3600 mg) by mouth in the evening.   Omeprazole-Sodium Bicarbonate (ZEGERID OTC) 20-1100 MG CAPS capsule Take 1 capsule by mouth daily before breakfast.   ondansetron (ZOFRAN) 4 MG tablet TAKE 1 TABLET EVERY 8 HOURS AS NEEDED FOR NAUSEA AND VOMITING   Thiamine HCl (B-1 PO) Take 1 tablet by mouth every evening.   TRULICITY 1.5 MG/0.5ML SOPN INJECT 1.5MG  INTO THE SKIN ONCE A WEEK   UNIFINE PENTIPS 31G X 6 MM MISC    No facility-administered encounter medications on file as of 11/21/2022.    Allergies (verified) Codeine, Ibuprofen, Ozempic (0.25 or 0.5 mg-dose) [semaglutide(0.25 or 0.5mg -dos)], Penicillins, and Jardiance [empagliflozin]   History: Past Medical History:  Diagnosis Date   Anxiety    Chronic kidney disease    Depression    Diabetes mellitus without complication (HCC)    type II    Dyslipidemia    Family history of adverse reaction to anesthesia    daughter - slow to wake up    GERD (gastroesophageal reflux disease)    Hiatal hernia    Hyperlipidemia    Hypertension    Internal hemorrhoids    Osteopenia    PONV (postoperative nausea and vomiting)    Vitamin D deficiency    Past Surgical History:  Procedure Laterality Date   ABDOMINAL AORTOGRAM W/LOWER EXTREMITY N/A 12/27/2021   Procedure: ABDOMINAL AORTOGRAM W/LOWER EXTREMITY;  Surgeon: Chuck Hint, MD;  Location: [facility] INVASIVE CV LAB;  Service: Cardiovascular;  Laterality: N/A;   ABDOMINAL  HYSTERECTOMY     LUMBAR DISC SURGERY     PERIPHERAL VASCULAR INTERVENTION Right 12/27/2021   Procedure: PERIPHERAL VASCULAR INTERVENTION;  Surgeon: Chuck Hint, MD;  Location: [facility] INVASIVE CV LAB;  Service: Cardiovascular;  Laterality: Right;   right hand surgery for cyst      SHOULDER OPEN ROTATOR CUFF REPAIR Right 04/30/2017   Procedure: Right shoulder mini open rotator cuff repair;  Surgeon: Jene Every, MD;  Location: [facility] ORS;  Service: Orthopedics;  Laterality: Right;  Interscalene Block   Family History  Problem Relation Age of Onset   Hodgkin's lymphoma Mother    Atrial fibrillation Mother    Cancer Father        Lungs   Colon cancer Neg Hx  Diabetes Neg Hx    Stomach cancer Neg Hx    Rectal cancer Neg Hx    Social History   Socioeconomic History   Marital status: Married    Spouse name: Casimiro Needle   Number of children: 2   Years of education: Not on file   Highest education level: Some college, no degree  Occupational History   Occupation: Psychologist, 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  Tobacco Use   Smoking status: Former    Types: Cigarettes    Start date: 04/22/2000   Smokeless tobacco: Never  Vaping Use   Vaping status: Never Used  Substance and Sexual Activity   Alcohol use: Yes    Comment: occsional use   Drug use: No   Sexual activity: Not Currently  Other Topics Concern   Not on file  Social History Narrative   Lives home with husband and great granddaughter. Son lives 1 hour away   Social Determinants of Health   Financial Resource Strain: Low Risk  (11/21/2022)   Overall Financial Resource Strain (CARDIA)    Difficulty of Paying Living Expenses: Not hard at all  Food Insecurity: No Food Insecurity (11/21/2022)   Hunger Vital Sign    Worried About Running Out of Food in the Last Year: Never true    Ran Out of Food in the Last Year: Never true  Transportation Needs: No Transportation Needs  (11/21/2022)   PRAPARE - Administrator, Civil Service (Medical): No    Lack of Transportation (Non-Medical): No  Physical Activity: Inactive (11/21/2022)   Exercise Vital Sign    Days of Exercise per Week: 0 days    Minutes of Exercise per Session: 0 min  Stress: No Stress Concern Present (11/21/2022)   [employer] of Occupational Health - Occupational Stress Questionnaire    Feeling of Stress : Not at all  Social Connections: Moderately Isolated (11/21/2022)   Social Connection and Isolation Panel [NHANES]    Frequency of Communication with Friends and Family: More than three times a week    Frequency of Social Gatherings with Friends and Family: More than three times a week    Attends Religious Services: Never    Database administrator or Organizations: No    Attends Engineer, structural: Never    Marital Status: Married    Tobacco Counseling Counseling given: Not Answered   Clinical Intake:  Pre-visit preparation completed: Yes  Pain : No/denies pain     Nutritional Risks: None Diabetes: No  How often do you need to have someone help you when you read instructions, pamphlets, or other written materials from your doctor or pharmacy?: 1 - Never  Interpreter Needed?: No  Information entered by :: Renie Ora, LPN   Activities of Daily Living    11/21/2022    9:42 AM 03/03/2022   11:51 AM  In your present state of health, do you have any difficulty performing the following activities:  Hearing? 0 0  Vision? 0 0  Difficulty concentrating or making decisions? 0 0  Walking or climbing stairs? 0 0  Dressing or bathing? 0 0  Doing errands, shopping? 0 0  Preparing Food and eating ? N N  Using the Toilet? N N  In the past six months, have you accidently leaked urine? N N  Do you have problems with loss of bowel control? N N  Managing your Medications? N N  Managing your Finances? N N  Housekeeping or managing your Housekeeping? N N     Patient Care Team: [name], FNP as PCP - General (Family Medicine) Danella Maiers, [facility] (Pharmacist) Ewing Schlein, MD as Referring Physician (Pain Medicine) Michaelle Copas, MD as Referring Physician (Optometry) Jene Every, MD as Consulting Physician (Orthopedic Surgery) Sater, Pearletha Furl, MD (Neurology) Audrie Gallus, RN as [facility] Care Management  Indicate any recent Medical Services you may have received from other than [facility] providers in the past year (date may be approximate).     Assessment:   This is a routine wellness examination for [place].  Hearing/Vision screen Vision Screening - Comments:: Wears rx glasses - up to date with routine eye exams with  Dr.Le   Dietary issues and exercise activities discussed:     Goals Addressed             This Visit's Progress    DIET - INCREASE WATER INTAKE         Depression Screen    11/21/2022    9:40 AM 08/05/2022   11:25 AM 03/03/2022   11:25 AM 02/04/2022   11:26 AM 11/19/2021   10:26 AM 08/01/2021    9:30 AM 05/07/2021    9:05 AM  PHQ 2/9 Scores  PHQ - 2 Score 0 2 0 0 0 0 0  PHQ- 9 Score  7  2  3 6     Fall Risk    11/21/2022    9:39 AM 03/03/2022   11:25 AM 02/04/2022   11:26 AM 11/19/2021   10:11 AM 08/01/2021    9:30 AM  Fall Risk   Falls in the past year? 0 0 0 0 0  Number falls in past yr: 0   0   Injury with Fall? 0   0   Risk for fall due to : No Fall Risks   Orthopedic patient   Follow up Falls prevention discussed   Falls prevention discussed Falls evaluation completed    MEDICARE RISK AT HOME: Medicare Risk at Home Any stairs in or around the home?: No If so, are there any without handrails?: No Home free of loose throw rugs in walkways, pet beds, electrical cords, etc?: Yes Adequate lighting in your home to reduce risk of falls?: Yes Life alert?: No Use of a cane, walker or w/c?: No Grab bars in the bathroom?: Yes Shower chair or bench in shower?: Yes Elevated  toilet seat or a handicapped toilet?: Yes  TIMED UP AND GO:  Was the test performed?  No    Cognitive Function:        11/21/2022    9:42 AM 11/19/2021   10:20 AM 09/12/2019    9:39 AM 09/10/2018    9:48 AM  6CIT Screen  What Year? 0 points 0 points 0 points 0 points  What month? 0 points 0 points 0 points 0 points  What time? 0 points 0 points 0 points 0 points  Count back from 20 0 points 0 points 0 points 0 points  Months in reverse 0 points 0 points 0 points 0 points  Repeat phrase 0 points 4 points 2 points 0 points  Total Score 0 points 4 points 2 points 0 points    Immunizations Immunization History  Administered Date(s) Administered   Fluad Quad(high Dose 65+) 01/07/2022   Influenza Split 03/05/2017   Influenza, High Dose Seasonal PF 12/18/2015, 01/07/2018, 01/22/2019   Influenza, Seasonal, Injecte,  Preservative Fre 12/22/2013, 02/10/2015   Influenza,inj,Quad PF,6+ Mos 12/22/2013, 02/10/2015   Influenza,inj,quad, With Preservative 02/28/2017   Influenza-Unspecified 11/29/2013, 02/10/2015, 02/28/2021   Moderna Sars-Covid-2 Vaccination 08/21/2019, 09/19/2019   Pneumococcal Conjugate-13 06/17/2016   Pneumococcal Polysaccharide-23 03/07/2013   Pneumococcal-Unspecified 08/30/2015   Tdap 09/05/2018   Zoster Recombinant(Shingrix) 05/07/2021, 08/01/2021   Zoster, Live 08/18/2013    TDAP status: Up to date  Flu Vaccine status: Up to date  Pneumococcal vaccine status: Up to date  Covid-19 vaccine status: Completed vaccines  Qualifies for Shingles Vaccine? Yes   Zostavax completed Yes   Shingrix Completed?: Yes  Screening Tests Health Maintenance  Topic Date Due   OPHTHALMOLOGY EXAM  09/06/2019   COVID-19 Vaccine (3 - 2023-24 season) 11/29/2021   INFLUENZA VACCINE  10/30/2022   HEMOGLOBIN A1C  02/05/2023   Diabetic kidney evaluation - eGFR measurement  08/05/2023   Diabetic kidney evaluation - Urine ACR  08/05/2023   FOOT EXAM  08/05/2023   Medicare Annual  Wellness (AWV)  11/21/2023   DEXA SCAN  02/05/2024   DTaP/Tdap/Td (2 - Td or Tdap) 09/04/2028   Pneumonia Vaccine 53+ Years old  Completed   Hepatitis C Screening  Completed   Zoster Vaccines- Shingrix  Completed   HPV VACCINES  Aged Out   MAMMOGRAM  Discontinued   Colonoscopy  Discontinued    Health Maintenance  Health Maintenance Due  Topic Date Due   OPHTHALMOLOGY EXAM  09/06/2019   COVID-19 Vaccine (3 - 2023-24 season) 11/29/2021   INFLUENZA VACCINE  10/30/2022    Colorectal cancer screening: No longer required.   Mammogram status: No longer required due to age.  Bone Density status: Completed 02/04/2022. Results reflect: Bone density results: OSTEOPOROSIS. Repeat every 2 years.  Lung Cancer Screening: (Low Dose CT Chest recommended if Age 79-80 years, 20 pack-year currently smoking OR have quit w/in 15years.) does not qualify.   Lung Cancer Screening Referral: n/a  Additional Screening:  Hepatitis C Screening: does not qualify; Completed 06/17/2016  Vision Screening: Recommended annual ophthalmology exams for early detection of glaucoma and other disorders of the eye. Is the patient up to date with their annual eye exam?  Yes  Who is the provider or what is the name of the office in which the patient attends annual eye exams? Dr.Le  If pt is not established with a provider, would they like to be referred to a provider to establish care? No .   Dental Screening: Recommended annual dental exams for proper oral hygiene  Community Resource Referral / Chronic Care Management: CRR required this visit?  No   CCM required this visit?  No     Plan:     I have personally reviewed and noted the following in the patient's chart:   Medical and social history Use of alcohol, tobacco or illicit drugs  Current medications and supplements including opioid prescriptions. Patient is currently taking opioid prescriptions. Information provided to patient regarding non-opioid  alternatives. Patient advised to discuss non-opioid treatment plan with their provider. Functional ability and status Nutritional status Physical activity Advanced directives List of other physicians Hospitalizations, surgeries, and ER visits in previous 12 months Vitals Screenings to include cognitive, depression, and falls Referrals and appointments  In addition, I have reviewed and discussed with patient certain preventive protocols, quality metrics, and best practice recommendations. A written personalized care plan for preventive services as well as general preventive health recommendations were provided to patient.     Lorrene Reid, LPN  11/21/2022   After Visit Summary: (MyChart) Due to this being a telephonic visit, the after visit summary with patients personalized plan was offered to patient via MyChart   Nurse Notes: none

## 2022-11-21 NOTE — Patient Instructions (Signed)
Ms. Lilienthal , Thank you for taking time to come for your Medicare Wellness Visit. I appreciate your ongoing commitment to your health goals. Please review the following plan we discussed and let me know if I can assist you in the future.   Referrals/Orders/Follow-Ups/Clinician Recommendations: Aim for 30 minutes of exercise or brisk walking, 6-8 glasses of water, and 5 servings of fruits and vegetables each day.   This is a list of the screening recommended for you and due dates:  Health Maintenance  Topic Date Due   Eye exam for diabetics  09/06/2019   COVID-19 Vaccine (3 - 2023-24 season) 11/29/2021   Flu Shot  10/30/2022   Hemoglobin A1C  02/05/2023   Yearly kidney function blood test for diabetes  08/05/2023   Yearly kidney health urinalysis for diabetes  08/05/2023   Complete foot exam   08/05/2023   Medicare Annual Wellness Visit  11/21/2023   DEXA scan (bone density measurement)  02/05/2024   DTaP/Tdap/Td vaccine (2 - Td or Tdap) 09/04/2028   Pneumonia Vaccine  Completed   Hepatitis C Screening  Completed   Zoster (Shingles) Vaccine  Completed   HPV Vaccine  Aged Out   Mammogram  Discontinued   Colon Cancer Screening  Discontinued    Advanced directives: (Copy Requested) Please bring a copy of your health care power of attorney and living will to the office to be added to your chart at your convenience.  Next Medicare Annual Wellness Visit scheduled for next year: Yes  Insert Preventive Care attachment Insert FALL PREVENTION attachment if needed

## 2022-11-24 DIAGNOSIS — M533 Sacrococcygeal disorders, not elsewhere classified: Secondary | ICD-10-CM | POA: Diagnosis not present

## 2022-11-24 DIAGNOSIS — M461 Sacroiliitis, not elsewhere classified: Secondary | ICD-10-CM | POA: Diagnosis not present

## 2022-11-24 DIAGNOSIS — Z5181 Encounter for therapeutic drug level monitoring: Secondary | ICD-10-CM | POA: Diagnosis not present

## 2022-11-24 DIAGNOSIS — Z79899 Other long term (current) drug therapy: Secondary | ICD-10-CM | POA: Diagnosis not present

## 2022-11-24 DIAGNOSIS — M961 Postlaminectomy syndrome, not elsewhere classified: Secondary | ICD-10-CM | POA: Diagnosis not present

## 2022-11-28 ENCOUNTER — Ambulatory Visit (HOSPITAL_COMMUNITY)
Admission: RE | Admit: 2022-11-28 | Discharge: 2022-11-28 | Disposition: A | Discharge status: Home / Self Care | Payer: Medicare Other | Source: Ambulatory Visit | Attending: Vascular Surgery | Admitting: Vascular Surgery

## 2022-11-28 ENCOUNTER — Ambulatory Visit (INDEPENDENT_AMBULATORY_CARE_PROVIDER_SITE_OTHER)
Admission: RE | Admit: 2022-11-28 | Discharge: 2022-11-28 | Disposition: A | Discharge status: Home / Self Care | Payer: Medicare Other | Source: Ambulatory Visit | Attending: Vascular Surgery | Admitting: Vascular Surgery

## 2022-11-28 ENCOUNTER — Encounter (HOSPITAL_COMMUNITY): Payer: Medicare Other

## 2022-11-28 DIAGNOSIS — I70223 Atherosclerosis of native arteries of extremities with rest pain, bilateral legs: Secondary | ICD-10-CM

## 2022-11-28 DIAGNOSIS — I70219 Atherosclerosis of native arteries of extremities with intermittent claudication, unspecified extremity: Secondary | ICD-10-CM

## 2022-11-28 LAB — VAS US ABI WITH/WO TBI
Left ABI: 0.67
Right ABI: 0.82

## 2022-12-02 DIAGNOSIS — M533 Sacrococcygeal disorders, not elsewhere classified: Secondary | ICD-10-CM | POA: Diagnosis not present

## 2022-12-03 ENCOUNTER — Encounter: Payer: Self-pay | Admitting: *Deleted

## 2022-12-03 ENCOUNTER — Other Ambulatory Visit: Payer: Medicare Other | Admitting: *Deleted

## 2022-12-03 NOTE — Patient Outreach (Signed)
Care Management   Visit Note  12/03/2022 Name: Sonya Cook MRN: [id number] DOB: [birth year]  Subjective: Sonya Cook is a 77 y.o. year old female who is a primary care patient of [name], FNP. The Care Management team was consulted for assistance.      Engaged with patient spoke with patient by telephone for follow up   Goals Addressed             This Visit's Progress    COMPLETED: CCM (DIABETES) EXPECTED OUTCOME:  MONITOR, SELF-MANAGE AND REDUCE SYMPTOMS OF DIABETES       Current Barriers:  Knowledge Deficits related to Diabetes management Chronic Disease Management support and education needs related to Diabetes, diet and exercise No Advanced Directives in place- pt requests information be mailed Patient reports she checks CBG once daily with fasting ranges 130-160 and has not been checking random.  Patient reports she tries to eat healthy and be mindful of carbohydrate intake Patient reports she is trying to be careful of what foods she eats because of  No new concerns reported today  Planned Interventions: Reviewed carbohydrate modified diet and nutritious food choices Reinforced foods high in purine to limit/ avoid due to diagnosis of gout Reviewed all medications and importance of taking as prescribed Reviewed upcoming scheduled appointments Pain assessment completed Reinforced importance of exercise Reviewed plan of care with patient including case closure  Symptom Management: Take medications as prescribed   Attend all scheduled provider appointments Call pharmacy for medication refills 3-7 days in advance of running out of medications Attend church or other social activities Perform all self care activities independently  Perform IADL's (shopping, preparing meals, housekeeping, managing finances) independently Call provider office for new concerns or questions  keep appointment with eye doctor schedule appointment with eye doctor check blood  sugar at prescribed times: per MD order - (pt is checking once daily)  check feet daily for cuts, sores or redness enter blood sugar readings and medication or insulin into daily log take the blood sugar log to all doctor visits take the blood sugar meter to all doctor visits trim toenails straight across fill half of plate with vegetables limit fast food meals to no more than 1 per week manage portion size prepare main meal at home 3 to 5 days each week read food labels for fat, fiber, carbohydrates and portion size set a realistic goal keep feet up while sitting Try to start walking again weather permitting Limit/ avoid foods high in purine for management of gout Case closure       COMPLETED: CCM (HYPERLIPIDEMIA) EXPECTED OUTCOME: MONITOR, SELF-MANAGE AND REDUCE SYMPTOMS OF HYPERLIPIDEMIA       Current Barriers:  Knowledge Deficits related to Hyperlipidemia management Chronic Disease Management support and education needs related to Hyperlipidemia, diet No Advanced Directives in place- pt requests documents be mailed Patient reports she lives with spouse, is independent in all aspects of her care, continues to drive, works full time 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  Patient reports since she had 2 stents placed in right leg she feels "like I have a new leg" and wants to walk more (in parking lots, around the house, etc) but the weather has been too cold, hopes to start back now since weather warms up. Patient reports she tries to eat a healthy diet Patient reports she has blood pressure cuff but does monitor blood pressure Patient reports she follows up with pain clinic with [facility] and recently taken off fentanyl, is  trying new medication (not sure spelling/ name of medication, RN care manager unable to find name of medication upon search)  Planned Interventions: Provider established cholesterol goals reviewed; Counseled on importance of regular laboratory monitoring as  prescribed; Reviewed role and benefits of statin for ASCVD risk reduction; Reviewed importance of limiting foods high in cholesterol; Reviewed exercise goals and target of 150 minutes per week; Reviewed importance of adherence to heart healthy diet  Symptom Management: Take medications as prescribed   Attend all scheduled provider appointments Call pharmacy for medication refills 3-7 days in advance of running out of medications Attend church or other social activities Perform all self care activities independently  Perform IADL's (shopping, preparing meals, housekeeping, managing finances) independently Call provider office for new concerns or questions  - call for medicine refill 2 or 3 days before it runs out - take all medications exactly as prescribed - call doctor with any symptoms you believe are related to your medicine - call doctor when you experience any new symptoms - go to all doctor appointments as scheduled - adhere to prescribed diet: heart healthy - develop an exercise routine- continue to walk as much as you are able Follow heart healthy diet- bake or broil foods instead of frying Case closure           Plan: case closure for nursing, continue working with pharmacist as needed  Irving Shows [facility], [facility] [place]/ Ambulatory Care Management [phone]

## 2022-12-03 NOTE — Patient Instructions (Signed)
Visit Information  Thank you for taking time to visit with me today. Please don't hesitate to contact me if I can be of assistance to you before our next scheduled telephone appointment.  Following are the goals we discussed today:   Goals Addressed             This Visit's Progress    COMPLETED: CCM (DIABETES) EXPECTED OUTCOME:  MONITOR, SELF-MANAGE AND REDUCE SYMPTOMS OF DIABETES       Current Barriers:  Knowledge Deficits related to Diabetes management Chronic Disease Management support and education needs related to Diabetes, diet and exercise No Advanced Directives in place- pt requests information be mailed Patient reports she checks CBG once daily with fasting ranges 130-160 and has not been checking random.  Patient reports she tries to eat healthy and be mindful of carbohydrate intake Patient reports she is trying to be careful of what foods she eats because of  No new concerns reported today  Planned Interventions: Reviewed carbohydrate modified diet and nutritious food choices Reinforced foods high in purine to limit/ avoid due to diagnosis of gout Reviewed all medications and importance of taking as prescribed Reviewed upcoming scheduled appointments Pain assessment completed Reinforced importance of exercise Reviewed plan of care with patient including case closure  Symptom Management: Take medications as prescribed   Attend all scheduled provider appointments Call pharmacy for medication refills 3-7 days in advance of running out of medications Attend church or other social activities Perform all self care activities independently  Perform IADL's (shopping, preparing meals, housekeeping, managing finances) independently Call provider office for new concerns or questions  keep appointment with eye doctor schedule appointment with eye doctor check blood sugar at prescribed times: per MD order - (pt is checking once daily)  check feet daily for cuts, sores or  redness enter blood sugar readings and medication or insulin into daily log take the blood sugar log to all doctor visits take the blood sugar meter to all doctor visits trim toenails straight across fill half of plate with vegetables limit fast food meals to no more than 1 per week manage portion size prepare main meal at home 3 to 5 days each week read food labels for fat, fiber, carbohydrates and portion size set a realistic goal keep feet up while sitting Try to start walking again weather permitting Limit/ avoid foods high in purine for management of gout Case closure       COMPLETED: CCM (HYPERLIPIDEMIA) EXPECTED OUTCOME: MONITOR, SELF-MANAGE AND REDUCE SYMPTOMS OF HYPERLIPIDEMIA       Current Barriers:  Knowledge Deficits related to Hyperlipidemia management Chronic Disease Management support and education needs related to Hyperlipidemia, diet No Advanced Directives in place- pt requests documents be mailed Patient reports she lives with spouse, is independent in all aspects of her care, continues to drive, works full time 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  Patient reports since she had 2 stents placed in right leg she feels "like I have a new leg" and wants to walk more (in parking lots, around the house, etc) but the weather has been too cold, hopes to start back now since weather warms up. Patient reports she tries to eat a healthy diet Patient reports she has blood pressure cuff but does monitor blood pressure Patient reports she follows up with pain clinic with [facility] and recently taken off fentanyl, is trying new medication (not sure spelling/ name of medication, RN care manager unable to find name of medication upon search)  Planned  Interventions: Provider established cholesterol goals reviewed; Counseled on importance of regular laboratory monitoring as prescribed; Reviewed role and benefits of statin for ASCVD risk reduction; Reviewed importance of limiting foods high in  cholesterol; Reviewed exercise goals and target of 150 minutes per week; Reviewed importance of adherence to heart healthy diet  Symptom Management: Take medications as prescribed   Attend all scheduled provider appointments Call pharmacy for medication refills 3-7 days in advance of running out of medications Attend church or other social activities Perform all self care activities independently  Perform IADL's (shopping, preparing meals, housekeeping, managing finances) independently Call provider office for new concerns or questions  - call for medicine refill 2 or 3 days before it runs out - take all medications exactly as prescribed - call doctor with any symptoms you believe are related to your medicine - call doctor when you experience any new symptoms - go to all doctor appointments as scheduled - adhere to prescribed diet: heart healthy - develop an exercise routine- continue to walk as much as you are able Follow heart healthy diet- bake or broil foods instead of frying Case closure           Please call the care guide team at [phone] if you need to cancel or reschedule your appointment.   If you are experiencing a Mental Health or Behavioral Health Crisis or need someone to talk to, please call the Suicide and Crisis Lifeline: 988 call the [place] National Suicide Prevention Lifeline: 519-624-0460 or TTY: 801-231-2035 TTY 564 628 2707) to talk to a trained counselor call 1-800-273-TALK (toll free, 24 hour hotline) go to [facility] [address] [place] [phone]) call the [facility] Crisis Line: [phone] call 911   Patient verbalizes understanding of instructions and care plan provided today and agrees to view in MyChart. Active MyChart status and patient understanding of how to access instructions and care plan via MyChart confirmed with patient.     No further follow up required: case closure  Irving Shows [facility], BSN [facility]/ Ambulatory Care Management [phone]

## 2022-12-04 ENCOUNTER — Encounter: Payer: Self-pay | Admitting: Vascular Surgery

## 2022-12-04 ENCOUNTER — Ambulatory Visit: Payer: Medicare Other | Admitting: Vascular Surgery

## 2022-12-04 VITALS — BP 136/83 | HR 62 | Temp 97.8°F | Resp 20 | Ht 62.0 in | Wt 146.0 lb

## 2022-12-04 DIAGNOSIS — I70219 Atherosclerosis of native arteries of extremities with intermittent claudication, unspecified extremity: Secondary | ICD-10-CM | POA: Diagnosis not present

## 2022-12-04 NOTE — Progress Notes (Signed)
REASON FOR VISIT:   Follow-up of peripheral arterial disease.  MEDICAL ISSUES:   PERIPHERAL ARTERIAL DISEASE WITH CLAUDICATION: The patient's claudication symptoms on the right have resolved.  She is having minimal claudication symptoms on the left where she has an SFA occlusion.  She is highly motivated.  I have encouraged her to continue to stay active.  Given that she has a recurrent stenosis in the mid stent on the right I have encouraged her to continue taking her Plavix.  She is also on aspirin and a statin.  She is not a smoker.  I have ordered a follow-up arterial duplex and ABIs in 6 months and she will be seen back at that time.  She understands that I will be retiring.   HPI:   Sonya Cook is a pleasant 77 y.o. female who had presented with disabling claudication of both lower extremities and a rest pain on the right leg.  She had evidence of infrainguinal arterial occlusive disease.  On 12/27/2021 she underwent angioplasty and stenting of the right superficial femoral artery.  Since I saw her last, she states that her symptoms in her right leg have improved and she has minimal claudication symptoms bilaterally.  She denies any history of rest pain or nonhealing ulcers.  She is fairly active.  She is not a smoker.  She is on aspirin, Plavix, and a statin.  Past Medical History:  Diagnosis Date   Anxiety    Chronic kidney disease    Depression    Diabetes mellitus without complication (HCC)    type II    Dyslipidemia    Family history of adverse reaction to anesthesia    daughter - slow to wake up    GERD (gastroesophageal reflux disease)    Hiatal hernia    Hyperlipidemia    Hypertension    Internal hemorrhoids    Osteopenia    PONV (postoperative nausea and vomiting)    Vitamin D deficiency     Family History  Problem Relation Age of Onset   Hodgkin's lymphoma Mother    Atrial fibrillation Mother    Cancer Father        Lungs   Colon cancer Neg Hx     Diabetes Neg Hx    Stomach cancer Neg Hx    Rectal cancer Neg Hx     SOCIAL HISTORY: Social History   Tobacco Use   Smoking status: Former    Types: Cigarettes    Start date: 04/22/2000   Smokeless tobacco: Never  Substance Use Topics   Alcohol use: Yes    Comment: occsional use    Allergies  Allergen Reactions   Codeine Hives   Ibuprofen Other (See Comments)    Stomach bleed   Ozempic (0.25 Or 0.5 Mg-Dose) [Semaglutide(0.25 Or 0.5mg -Dos)] Nausea Only   Penicillins Hives    Has patient had a PCN reaction causing immediate rash, facial/tongue/throat swelling, SOB or lightheadedness with hypotension: Yes Has patient had a PCN reaction causing severe rash involving mucus membranes or skin necrosis: No Has patient had a PCN reaction that required hospitalization: No Has patient had a PCN reaction occurring within the last 10 years: No If all of the above answers are "NO", then may proceed with Cephalosporin use.    Jardiance [Empagliflozin]     Felt bad, couldn't get out of bed    Current Outpatient Medications  Medication Sig Dispense Refill   acetaminophen (TYLENOL) 650 MG CR tablet Take 650-1,300 mg  by mouth every 8 (eight) hours as needed for pain.     allopurinol (ZYLOPRIM) 100 MG tablet TAKE ONE (1) TABLET BY MOUTH EVERY DAY 90 tablet 0   aspirin EC 81 MG tablet Take 81 mg by mouth every evening. Swallow whole.     atorvastatin (LIPITOR) 40 MG tablet Take 1 tablet (40 mg total) by mouth every evening. TAKE ONE (1) TABLET EACH DAY 90 tablet 3   BELBUCA 300 MCG FILM Take by mouth 2 (two) times daily.     busPIRone (BUSPAR) 5 MG tablet TAKE ONE TABLET 3 TIMES A DAY AS NEEDED. 90 tablet 0   Cholecalciferol (VITAMIN D3) 50 MCG (2000 UT) TABS Take 2,000-4,000 Units by mouth See admin instructions. Take 1 tablet (2000 units) by mouth in the morning & take 2 tablets (4000 units) by mouth at night.     clopidogrel (PLAVIX) 75 MG tablet Take 1 tablet (75 mg total) by mouth daily.  30 tablet 11   DULoxetine (CYMBALTA) 60 MG capsule TAKE ONE (1) CAPSULE EACH DAY 90 capsule 0   lisinopril (ZESTRIL) 10 MG tablet TAKE ONE (1) TABLET EACH DAY 30 tablet 2   lubiprostone (AMITIZA) 8 MCG capsule Take 1 capsule (8 mcg total) by mouth 2 (two) times daily with a meal. 90 capsule 1   Multiple Vitamin (MULTIVITAMIN) LIQD Take 5 mLs by mouth daily.     NARCAN 4 MG/0.1ML LIQD nasal spray kit Place 1 spray into the nose as needed (accidental overdose).     Omega-3 Fatty Acids (FISH OIL MAXIMUM STRENGTH) 1200 MG CAPS Take 2,400-3,600 mg by mouth See admin instructions. Take 2 capsules (2400 mg) by mouth in the morning & 3 capsules (3600 mg) by mouth in the evening.     Omeprazole-Sodium Bicarbonate (ZEGERID OTC) 20-1100 MG CAPS capsule Take 1 capsule by mouth daily before breakfast.     ondansetron (ZOFRAN) 4 MG tablet TAKE 1 TABLET EVERY 8 HOURS AS NEEDED FOR NAUSEA AND VOMITING 90 tablet 1   Thiamine HCl (B-1 PO) Take 1 tablet by mouth every evening.     TRULICITY 1.5 MG/0.5ML SOPN INJECT 1.5MG  INTO THE SKIN ONCE A WEEK 2 mL 2   UNIFINE PENTIPS 31G X 6 MM MISC      No current facility-administered medications for this visit.    REVIEW OF SYSTEMS:  [X]  denotes positive finding, [ ]  denotes negative finding Cardiac  Comments:  Chest pain or chest pressure:    Shortness of breath upon exertion:    Short of breath when lying flat:    Irregular heart rhythm:        Vascular    Pain in calf, thigh, or hip brought on by ambulation:    Pain in feet at night that wakes you up from your sleep:     Blood clot in your veins:    Leg swelling:         Pulmonary    Oxygen at home:    Productive cough:     Wheezing:         Neurologic    Sudden weakness in arms or legs:     Sudden numbness in arms or legs:     Sudden onset of difficulty speaking or slurred speech:    Temporary loss of vision in one eye:     Problems with dizziness:         Gastrointestinal    Blood in stool:      Vomited blood:  Genitourinary    Burning when urinating:     Blood in urine:        Psychiatric    Major depression:         Hematologic    Bleeding problems:    Problems with blood clotting too easily:        Skin    Rashes or ulcers:        Constitutional    Fever or chills:     PHYSICAL EXAM:   Vitals:   12/04/22 1524  BP: 136/83  Pulse: 62  Resp: 20  Temp: 97.8 F (36.6 C)  SpO2: 98%  Weight: 146 lb (66.2 kg)  Height: 5\' 2"  (1.575 m)   GENERAL: The patient is a well-nourished female, in no acute distress. The vital signs are documented above. CARDIAC: There is a regular rate and rhythm.  VASCULAR: I do not detect carotid bruits. On the right side she has a palpable femoral and popliteal pulse.  I cannot palpate pedal pulses. On the left side she has a palpable femoral pulse.  I cannot palpate popliteal or pedal pulses. PULMONARY: There is good air exchange bilaterally without wheezing or rales. ABDOMEN: Soft and non-tender with normal pitched bowel sounds.  MUSCULOSKELETAL: There are no major deformities or cyanosis. NEUROLOGIC: No focal weakness or paresthesias are detected. SKIN: There are no ulcers or rashes noted. PSYCHIATRIC: The patient has a normal affect.  DATA:    ARTERIAL DOPPLER STUDY: I have reviewed the arterial Doppler study that was done on 11/28/2022.  On the right side there was a monophasic posterior tibial and dorsalis pedis signal.  ABI was 82%.  Toe pressure was 60 mmHg.  On the left side there was a monophasic posterior tibial signal and dorsalis pedis signal.  ABI was 67%.  Toe pressure was 48 mmHg.  ARTERIAL DUPLEX: I have reviewed the arterial duplex scan of the right lower extremity that was done on 11/28/2022.  The right superficial femoral artery stent was patent with biphasic flow throughout.  There were elevated velocities up to 328 cm/s in the mid stent.   Waverly Ferrari [facility] [phone]

## 2022-12-05 ENCOUNTER — Ambulatory Visit (INDEPENDENT_AMBULATORY_CARE_PROVIDER_SITE_OTHER): Payer: Medicare Other

## 2022-12-05 DIAGNOSIS — E1122 Type 2 diabetes mellitus with diabetic chronic kidney disease: Secondary | ICD-10-CM | POA: Diagnosis not present

## 2022-12-05 DIAGNOSIS — N183 Chronic kidney disease, stage 3 unspecified: Secondary | ICD-10-CM

## 2022-12-05 LAB — HM DIABETES EYE EXAM

## 2022-12-05 NOTE — Progress Notes (Signed)
Sonya Cook arrived 12/05/2022 and has given verbal consent to obtain images and complete their overdue diabetic retinal screening.  The images have been sent to an ophthalmologist or optometrist for review and interpretation.  Results will be sent back to [name], FNP for review.  Patient has been informed they will be contacted when we receive the results via telephone or MyChart

## 2022-12-13 ENCOUNTER — Other Ambulatory Visit: Payer: Self-pay | Admitting: Family Medicine

## 2022-12-16 ENCOUNTER — Other Ambulatory Visit: Payer: Self-pay

## 2022-12-16 ENCOUNTER — Telehealth: Payer: Self-pay | Admitting: Family

## 2022-12-16 DIAGNOSIS — I70223 Atherosclerosis of native arteries of extremities with rest pain, bilateral legs: Secondary | ICD-10-CM

## 2022-12-16 NOTE — Telephone Encounter (Signed)
Pt called requesting to speak to [place]. She has questions about her medications.

## 2022-12-17 ENCOUNTER — Other Ambulatory Visit: Payer: Medicare Other | Admitting: Pharmacist

## 2022-12-18 NOTE — Progress Notes (Signed)
12/17/2022 Name: Sonya Cook MRN: [id number] DOB: 09/26/1945  Chief Complaint  Patient presents with   Medication Management    Diabetes    Patient likely in the coverage gap with Medicare due to Trulicity/[place].  Patient is doing well on Trulicity for her T2DM.  Samples of Trulicity are not available at this time due to shortages.  Will sample 1 box of Mounjaro 2.5mg  weekly to bridge patient's supply.  Counseled on dose/side effects.  Patient denies personal or family history of medullary thyroid carcinoma (MTC) or in patients with Multiple Endocrine Neoplasia syndrome type 2 (MEN 2)   Objective:  Lab Results  Component Value Date   HGBA1C 7.3 (H) 08/05/2022    Lab Results  Component Value Date   CREATININE 0.96 08/05/2022   BUN 22 08/05/2022   NA 140 08/05/2022   K 4.7 08/05/2022   CL 98 08/05/2022   CO2 25 08/05/2022    Lab Results  Component Value Date   CHOL 165 08/05/2022   HDL 46 08/05/2022   LDLCALC 89 08/05/2022   TRIG 175 (H) 08/05/2022   CHOLHDL 3.6 08/05/2022    Medications Reviewed Today     Reviewed by Danella Maiers, [facility] (Pharmacist) on 12/18/22 at 1155  Med List Status: <None>   Medication Order Taking? Sig Documenting Provider Last Dose Status Informant  acetaminophen (TYLENOL) 650 MG CR tablet [id number] No Take 650-1,300 mg by mouth every 8 (eight) hours as needed for pain. [provider] Taking Active Self  allopurinol (ZYLOPRIM) 100 MG tablet [id number] No TAKE ONE (1) TABLET BY MOUTH EVERY DAY Hawks, Christy A, FNP Taking Active   aspirin EC 81 MG tablet [id number] No Take 81 mg by mouth every evening. Swallow whole. [provider] Taking Active Self  atorvastatin (LIPITOR) 40 MG tablet [id number] No Take 1 tablet (40 mg total) by mouth every evening. TAKE ONE (1) TABLET EACH DAY [name], FNP Taking Active   BELBUCA 300 MCG FILM [id number] No Take by mouth 2 (two) times daily. [provider] Taking Active    busPIRone (BUSPAR) 5 MG tablet [id number] No TAKE ONE TABLET 3 TIMES A DAY AS NEEDED. [name], FNP Taking Active   Cholecalciferol (VITAMIN D3) 50 MCG (2000 UT) TABS [id number] No Take 2,000-4,000 Units by mouth See admin instructions. Take 1 tablet (2000 units) by mouth in the morning & take 2 tablets (4000 units) by mouth at night. [provider] Taking Active Self  clopidogrel (PLAVIX) 75 MG tablet [id number] No Take 1 tablet (75 mg total) by mouth daily. Chuck Hint, MD Taking Active   Dulaglutide (TRULICITY) 1.5 MG/0.5ML [facility] [id number]  INJECT 1.5MG  INTO THE SKIN ONCE A WEEK Jannifer Rodney A, FNP  Active   DULoxetine (CYMBALTA) 60 MG capsule [id number] No TAKE ONE (1) CAPSULE EACH DAY Hawks, Fowler A, FNP Taking Active   lisinopril (ZESTRIL) 10 MG tablet [id number] No TAKE ONE (1) TABLET EACH DAY Hawks, [place] A, FNP Taking Active   lubiprostone (AMITIZA) 8 MCG capsule [id number] No Take 1 capsule (8 mcg total) by mouth 2 (two) times daily with a meal. [name], FNP Taking Active   Multiple Vitamin (MULTIVITAMIN) LIQD [id number] No Take 5 mLs by mouth daily. [provider] Taking Active Self  NARCAN 4 MG/0.1ML LIQD nasal spray kit [id number] No Place 1 spray into the nose as needed (accidental overdose). [provider] Taking Active Self  Med Note ABIAGEAL, YUAN Dec 23, 2021 10:54 AM) On hand  Omega-3 Fatty Acids (FISH OIL MAXIMUM STRENGTH) 1200 MG CAPS [id number] No Take 2,400-3,600 mg by mouth See admin instructions. Take 2 capsules (2400 mg) by mouth in the morning & 3 capsules (3600 mg) by mouth in the evening. [provider] Taking Active Self  Omeprazole-Sodium Bicarbonate (ZEGERID OTC) 20-1100 MG CAPS capsule [id number] No Take 1 capsule by mouth daily before breakfast. [provider] Taking Active Self  ondansetron (ZOFRAN) 4 MG tablet [id number] No TAKE 1 TABLET EVERY 8 HOURS AS NEEDED FOR NAUSEA AND  VOMITING Jannifer Rodney A, FNP Taking Active   Thiamine HCl (B-1 PO) [id number] No Take 1 tablet by mouth every evening. [provider] Taking Active Self  Wolfgang [place] 6 MM MISC [id number] No  [provider] Taking Active Self           Med Note HAIDI, ABSTON Dec 23, 2021 10:56 AM)              Follow Up Plan: encouraged patient to reach out to PharmD to enroll for PAP next year if eligible and discuss tolerability of Mounjaro as needed   Kieth Brightly, PharmD, BCACP Clinical Pharmacist, [facility]

## 2022-12-22 DIAGNOSIS — M461 Sacroiliitis, not elsewhere classified: Secondary | ICD-10-CM | POA: Diagnosis not present

## 2022-12-22 DIAGNOSIS — G894 Chronic pain syndrome: Secondary | ICD-10-CM | POA: Diagnosis not present

## 2022-12-22 DIAGNOSIS — M533 Sacrococcygeal disorders, not elsewhere classified: Secondary | ICD-10-CM | POA: Diagnosis not present

## 2022-12-22 DIAGNOSIS — Z79899 Other long term (current) drug therapy: Secondary | ICD-10-CM | POA: Diagnosis not present

## 2022-12-22 DIAGNOSIS — M961 Postlaminectomy syndrome, not elsewhere classified: Secondary | ICD-10-CM | POA: Diagnosis not present

## 2022-12-22 DIAGNOSIS — Z5181 Encounter for therapeutic drug level monitoring: Secondary | ICD-10-CM | POA: Diagnosis not present

## 2022-12-29 ENCOUNTER — Other Ambulatory Visit: Payer: Self-pay | Admitting: Family

## 2022-12-29 DIAGNOSIS — I10 Essential (primary) hypertension: Secondary | ICD-10-CM

## 2023-01-01 ENCOUNTER — Encounter: Payer: Self-pay | Admitting: Pharmacist

## 2023-01-01 NOTE — Telephone Encounter (Signed)
See pharmacy note

## 2023-01-08 DIAGNOSIS — M533 Sacrococcygeal disorders, not elsewhere classified: Secondary | ICD-10-CM | POA: Diagnosis not present

## 2023-01-08 DIAGNOSIS — M961 Postlaminectomy syndrome, not elsewhere classified: Secondary | ICD-10-CM | POA: Diagnosis not present

## 2023-01-08 DIAGNOSIS — M461 Sacroiliitis, not elsewhere classified: Secondary | ICD-10-CM | POA: Diagnosis not present

## 2023-01-08 DIAGNOSIS — G894 Chronic pain syndrome: Secondary | ICD-10-CM | POA: Diagnosis not present

## 2023-01-19 ENCOUNTER — Other Ambulatory Visit: Payer: Self-pay | Admitting: Family

## 2023-01-19 DIAGNOSIS — M1A9XX0 Chronic gout, unspecified, without tophus (tophi): Secondary | ICD-10-CM

## 2023-01-25 ENCOUNTER — Encounter: Payer: Self-pay | Admitting: Pharmacist

## 2023-01-25 NOTE — Progress Notes (Signed)
Pharmacy Quality Measure Review  This patient is appearing on a report for being at risk of failing the adherence measure for cholesterol (statin) medications this calendar year.   Medication: atorvastatin 40 mg Last fill date: 9/30 for 30 day supply  Insurance report was not up to date. No action needed at this time.   Jarrett Ables, PharmD PGY-1 Pharmacy Resident

## 2023-02-05 ENCOUNTER — Other Ambulatory Visit: Payer: Self-pay | Admitting: Family

## 2023-02-05 DIAGNOSIS — E785 Hyperlipidemia, unspecified: Secondary | ICD-10-CM

## 2023-02-05 DIAGNOSIS — M533 Sacrococcygeal disorders, not elsewhere classified: Secondary | ICD-10-CM | POA: Diagnosis not present

## 2023-02-05 DIAGNOSIS — M961 Postlaminectomy syndrome, not elsewhere classified: Secondary | ICD-10-CM | POA: Diagnosis not present

## 2023-02-05 DIAGNOSIS — G894 Chronic pain syndrome: Secondary | ICD-10-CM | POA: Diagnosis not present

## 2023-02-06 ENCOUNTER — Telehealth: Payer: Self-pay | Admitting: Family

## 2023-02-06 ENCOUNTER — Telehealth: Payer: Self-pay | Admitting: Family Medicine

## 2023-02-06 ENCOUNTER — Other Ambulatory Visit: Payer: Self-pay | Admitting: Family

## 2023-02-06 DIAGNOSIS — I10 Essential (primary) hypertension: Secondary | ICD-10-CM

## 2023-02-06 NOTE — Telephone Encounter (Signed)
Sample up front patient aware  

## 2023-02-06 NOTE — Telephone Encounter (Signed)
Copied from CRM [id number]. Topic: Clinical - Medication Question >> Feb 06, 2023 11:19 AM Prudencio Pair wrote: Reason for CRM: Patient, Sonya Cook, calling to speak with pharmacist about diabetic medication. Patient says pharmacist has samples of medication and she wants to see if she has anymore. Would like for pharmacist to give her a call. CB # [phone].

## 2023-02-06 NOTE — Telephone Encounter (Signed)
Hawks, NTBS 30 days given 12/29/22

## 2023-02-06 NOTE — Telephone Encounter (Signed)
See other pharmd note

## 2023-02-06 NOTE — Telephone Encounter (Signed)
Called patient she states she takes monjarno samples from [place] do not see on med list please advise

## 2023-02-06 NOTE — Telephone Encounter (Signed)
I was giving her mounjaro samples (as we had them) There may be some in the back mini fridge Okay to give if we have If not, we will have to wait and I can try to get her something next week

## 2023-02-06 NOTE — Telephone Encounter (Signed)
Pt requesting to speak to [name]. Says [name] told her to call her whenever she needed more vouchers for her medicine. Pt says she can't remember what the medicine is called but says it is a sister to trulicity. Says she needs more vouchers.   Please advise.

## 2023-02-10 ENCOUNTER — Encounter: Payer: Self-pay | Admitting: Family

## 2023-02-10 ENCOUNTER — Other Ambulatory Visit: Payer: Self-pay | Admitting: Family

## 2023-02-10 ENCOUNTER — Ambulatory Visit (INDEPENDENT_AMBULATORY_CARE_PROVIDER_SITE_OTHER): Payer: Medicare Other | Admitting: Family

## 2023-02-10 VITALS — BP 102/61 | HR 71 | Temp 97.9°F | Ht 62.0 in | Wt 148.2 lb

## 2023-02-10 DIAGNOSIS — Z7985 Long-term (current) use of injectable non-insulin antidiabetic drugs: Secondary | ICD-10-CM

## 2023-02-10 DIAGNOSIS — M5136 Other intervertebral disc degeneration, lumbar region with discogenic back pain only: Secondary | ICD-10-CM

## 2023-02-10 DIAGNOSIS — K219 Gastro-esophageal reflux disease without esophagitis: Secondary | ICD-10-CM

## 2023-02-10 DIAGNOSIS — F331 Major depressive disorder, recurrent, moderate: Secondary | ICD-10-CM

## 2023-02-10 DIAGNOSIS — I10 Essential (primary) hypertension: Secondary | ICD-10-CM

## 2023-02-10 DIAGNOSIS — I700 Atherosclerosis of aorta: Secondary | ICD-10-CM | POA: Diagnosis not present

## 2023-02-10 DIAGNOSIS — E663 Overweight: Secondary | ICD-10-CM

## 2023-02-10 DIAGNOSIS — N1831 Chronic kidney disease, stage 3a: Secondary | ICD-10-CM | POA: Diagnosis not present

## 2023-02-10 DIAGNOSIS — E1122 Type 2 diabetes mellitus with diabetic chronic kidney disease: Secondary | ICD-10-CM

## 2023-02-10 DIAGNOSIS — H60543 Acute eczematoid otitis externa, bilateral: Secondary | ICD-10-CM

## 2023-02-10 DIAGNOSIS — E785 Hyperlipidemia, unspecified: Secondary | ICD-10-CM

## 2023-02-10 DIAGNOSIS — F411 Generalized anxiety disorder: Secondary | ICD-10-CM

## 2023-02-10 DIAGNOSIS — N183 Chronic kidney disease, stage 3 unspecified: Secondary | ICD-10-CM | POA: Diagnosis not present

## 2023-02-10 DIAGNOSIS — Z23 Encounter for immunization: Secondary | ICD-10-CM

## 2023-02-10 DIAGNOSIS — G8929 Other chronic pain: Secondary | ICD-10-CM

## 2023-02-10 LAB — BAYER DCA HB A1C WAIVED: HB A1C (BAYER DCA - WAIVED): 6 % — ABNORMAL HIGH (ref 4.8–5.6)

## 2023-02-10 MED ORDER — LISINOPRIL 10 MG PO TABS
10.0000 mg | ORAL_TABLET | Freq: Every day | ORAL | 1 refills | Status: DC
Start: 2023-02-10 — End: 2023-08-31

## 2023-02-10 MED ORDER — TRIAMCINOLONE ACETONIDE 0.5 % EX OINT: 1.0000 | TOPICAL_OINTMENT | Freq: Two times a day (BID) | CUTANEOUS | 0 refills | Status: DC

## 2023-02-10 MED ORDER — ATORVASTATIN CALCIUM 40 MG PO TABS
40.0000 mg | ORAL_TABLET | Freq: Every evening | ORAL | 1 refills | Status: DC
Start: 2023-02-10 — End: 2023-08-04

## 2023-02-10 MED ORDER — TIRZEPATIDE 5 MG/0.5ML ~~LOC~~ SOAJ: 5.0000 mg | SUBCUTANEOUS | 1 refills | Status: DC

## 2023-02-10 NOTE — Patient Instructions (Signed)
Health Maintenance After Age 76 After age 76, you are at a higher risk for certain long-term diseases and infections as well as injuries from falls. Falls are a major cause of broken bones and head injuries in people who are older than age 76. Getting regular preventive care can help to keep you healthy and well. Preventive care includes getting regular testing and making lifestyle changes as recommended by your health care provider. Talk with your health care provider about: Which screenings and tests you should have. A screening is a test that checks for a disease when you have no symptoms. A diet and exercise plan that is right for you. What should I know about screenings and tests to prevent falls? Screening and testing are the best ways to find a health problem early. Early diagnosis and treatment give you the best chance of managing medical conditions that are common after age 76. Certain conditions and lifestyle choices may make you more likely to have a fall. Your health care provider may recommend: Regular vision checks. Poor vision and conditions such as cataracts can make you more likely to have a fall. If you wear glasses, make sure to get your prescription updated if your vision changes. Medicine review. Work with your health care provider to regularly review all of the medicines you are taking, including over-the-counter medicines. Ask your health care provider about any side effects that may make you more likely to have a fall. Tell your health care provider if any medicines that you take make you feel dizzy or sleepy. Strength and balance checks. Your health care provider may recommend certain tests to check your strength and balance while standing, walking, or changing positions. Foot health exam. Foot pain and numbness, as well as not wearing proper footwear, can make you more likely to have a fall. Screenings, including: Osteoporosis screening. Osteoporosis is a condition that causes  the bones to get weaker and break more easily. Blood pressure screening. Blood pressure changes and medicines to control blood pressure can make you feel dizzy. Depression screening. You may be more likely to have a fall if you have a fear of falling, feel depressed, or feel unable to do activities that you used to do. Alcohol use screening. Using too much alcohol can affect your balance and may make you more likely to have a fall. Follow these instructions at home: Lifestyle Do not drink alcohol if: Your health care provider tells you not to drink. If you drink alcohol: Limit how much you have to: 0-1 drink a day for women. 0-2 drinks a day for men. Know how much alcohol is in your drink. In the [place] one drink equals one 12 oz bottle of beer (355 mL), one 5 oz glass of wine (148 mL), or one 1 oz glass of hard liquor (44 mL). Do not use any products that contain nicotine or tobacco. These products include cigarettes, chewing tobacco, and vaping devices, such as e-cigarettes. If you need help quitting, ask your health care provider. Activity  Follow a regular exercise program to stay fit. This will help you maintain your balance. Ask your health care provider what types of exercise are appropriate for you. If you need a cane or walker, use it as recommended by your health care provider. Wear supportive shoes that have nonskid soles. Safety  Remove any tripping hazards, such as rugs, cords, and clutter. Install safety equipment such as grab bars in bathrooms and safety rails on stairs. Keep rooms and walkways   well-lit. General instructions Talk with your health care provider about your risks for falling. Tell your health care provider if: You fall. Be sure to tell your health care provider about all falls, even ones that seem minor. You feel dizzy, tiredness (fatigue), or off-balance. Take over-the-counter and prescription medicines only as told by your health care provider. These include  supplements. Eat a healthy diet and maintain a healthy weight. A healthy diet includes low-fat dairy products, low-fat (lean) meats, and fiber from whole grains, beans, and lots of fruits and vegetables. Stay current with your vaccines. Schedule regular health, dental, and eye exams. Summary Having a healthy lifestyle and getting preventive care can help to protect your health and wellness after age 76. Screening and testing are the best way to find a health problem early and help you avoid having a fall. Early diagnosis and treatment give you the best chance for managing medical conditions that are more common for people who are older than age 76. Falls are a major cause of broken bones and head injuries in people who are older than age 76. Take precautions to prevent a fall at home. Work with your health care provider to learn what changes you can make to improve your health and wellness and to prevent falls. This information is not intended to replace advice given to you by your health care provider. Make sure you discuss any questions you have with your health care provider. Document Revised: 08/06/2020 Document Reviewed: 08/06/2020 Elsevier Patient Education  2024 Elsevier Inc.  

## 2023-02-10 NOTE — Progress Notes (Signed)
Subjective:    Patient ID: Sonya Cook, female    DOB: Jan 01, 1946, 77 y.o.   MRN: [id number]  Chief Complaint  Patient presents with   Medical Management of Chronic Issues   PT presents to the office today for chronic follow up. PT is followed by Pain Management for chronic back pain monthly. She states her pain management provider needs a letter stating she is medically stable.    She has aortic atherosclerosis and takes Lipitor daily.    She has CKD and avoids NSAID's.    Has aortic atherosclerosis and taking Lipitor 40 mg daily. She is followed by Vascular for PAD. She had aortogram of right lower leg on 12/27/21. Reports her right leg pain is a 0.   She has had three gout flare ups in the last year. Taking allopurinol 100 mg daily.    Complaining of bilateral ears itching and dryness that  Hypertension This is a chronic problem. The current episode started more than 1 year ago. The problem has been waxing and waning since onset. The problem is uncontrolled. Associated symptoms include blurred vision. Pertinent negatives include no malaise/fatigue, peripheral edema or shortness of breath. Risk factors for coronary artery disease include dyslipidemia, diabetes mellitus, sedentary lifestyle and post-menopausal state. The current treatment provides moderate improvement. Hypertensive end-organ damage includes PVD.  Gastroesophageal Reflux She complains of belching and heartburn. This is a chronic problem. The current episode started more than 1 year ago. The problem occurs occasionally. She has tried a PPI for the symptoms. The treatment provided moderate relief.  Diabetes She presents for her follow-up diabetic visit. She has type 2 diabetes mellitus. Associated symptoms include blurred vision and foot paresthesias. Symptoms are stable. Diabetic complications include PVD. Risk factors for coronary artery disease include dyslipidemia, diabetes mellitus, hypertension, post-menopausal and  sedentary lifestyle. She is following a generally healthy diet. Her overall blood glucose range is 140-180 mg/dl. Eye exam is current.  Back Pain This is a chronic problem. The current episode started more than 1 year ago. The problem occurs intermittently. The pain is present in the lumbar spine. The quality of the pain is described as aching. The pain is at a severity of 6/10. The pain is moderate. Risk factors include obesity. She has tried analgesics for the symptoms. The treatment provided moderate relief.  Hyperlipidemia This is a chronic problem. The current episode started more than 1 year ago. The problem is controlled. Recent lipid tests were reviewed and are normal. Exacerbating diseases include obesity. Pertinent negatives include no shortness of breath. Current antihyperlipidemic treatment includes statins. The current treatment provides moderate improvement of lipids. Risk factors for coronary artery disease include dyslipidemia, hypertension, a sedentary lifestyle and post-menopausal.  Depression        This is a chronic problem.  The current episode started more than 1 year ago.   The problem occurs intermittently.  Associated symptoms include sad.  Associated symptoms include no helplessness and no hopelessness.  Past treatments include SNRIs - Serotonin and norepinephrine reuptake inhibitors.     Review of Systems  Constitutional:  Negative for malaise/fatigue.  Eyes:  Positive for blurred vision.  Respiratory:  Negative for shortness of breath.   Gastrointestinal:  Positive for heartburn.  Musculoskeletal:  Positive for back pain.  Psychiatric/Behavioral:  Positive for depression.   All other systems reviewed and are negative.      Objective:   Physical Exam Vitals reviewed.  Constitutional:      General: She is  not in acute distress.    Appearance: She is well-developed.  HENT:     Head: Normocephalic and atraumatic.     Right Ear: Tympanic membrane normal.     Left  Ear: Tympanic membrane normal.  Eyes:     Pupils: Pupils are equal, round, and reactive to light.  Neck:     Thyroid: No thyromegaly.  Cardiovascular:     Rate and Rhythm: Normal rate and regular rhythm.     Heart sounds: Normal heart sounds. No murmur heard. Pulmonary:     Effort: Pulmonary effort is normal. No respiratory distress.     Breath sounds: Normal breath sounds. No wheezing.  Abdominal:     General: Bowel sounds are normal. There is no distension.     Palpations: Abdomen is soft.     Tenderness: There is no abdominal tenderness.  Musculoskeletal:        General: No tenderness. Normal range of motion.     Cervical back: Normal range of motion and neck supple.  Skin:    General: Skin is warm and dry.  Neurological:     Mental Status: She is alert and oriented to person, place, and time.     Cranial Nerves: No cranial nerve deficit.     Deep Tendon Reflexes: Reflexes are normal and symmetric.  Psychiatric:        Behavior: Behavior normal.        Thought Content: Thought content normal.        Judgment: Judgment normal.        BP 102/61   Pulse 71   Temp 97.9 F (36.6 C) (Temporal)   Ht 5\' 2"  (1.575 m)   Wt 148 lb 3.2 oz (67.2 kg)   SpO2 97%   BMI 27.11 kg/m   Assessment & Plan:  Sonya Cook comes in today with chief complaint of Medical Management of Chronic Issues   Diagnosis and orders addressed:  1. Hyperlipidemia, unspecified hyperlipidemia type - atorvastatin (LIPITOR) 40 MG tablet; Take 1 tablet (40 mg total) by mouth every evening.  Dispense: 90 tablet; Refill: 1 - Comprehensive metabolic panel  2. Hypertension, unspecified type - lisinopril (ZESTRIL) 10 MG tablet; Take 1 tablet (10 mg total) by mouth daily.  Dispense: 90 tablet; Refill: 1 - Comprehensive metabolic panel  3. Moderate episode of recurrent major depressive disorder (HCC) - Comprehensive metabolic panel  4. Type 2 diabetes mellitus with stage 3 chronic kidney disease,  without long-term current use of insulin, unspecified whether stage 3a or 3b CKD (HCC) Will try Mounjaro with coupon to see if covered. PT in donut hole. Low carb diet  - Bayer DCA Hb A1c Waived - Comprehensive metabolic panel - tirzepatide (MOUNJARO) 5 MG/0.5ML Pen; Inject 5 mg into the skin once a week.  Dispense: 6 mL; Refill: 1  5. Gastroesophageal reflux disease, unspecified whether esophagitis present - Comprehensive metabolic panel  6. Degeneration of intervertebral disc of lumbar region with discogenic back pain - Comprehensive metabolic panel  7. Stage 3a chronic kidney disease (HCC) - Comprehensive metabolic panel  8. Overweight (BMI 25.0-29.9)  - Comprehensive metabolic panel  9. Aortic atherosclerosis (HCC) - Comprehensive metabolic panel  10. Chronic low back pain, unspecified back pain laterality, unspecified whether sciatica present - Comprehensive metabolic panel  11. Dermatitis of both ear canals  - Comprehensive metabolic panel - triamcinolone ointment (KENALOG) 0.5 %; Apply 1 Application topically 2 (two) times daily.  Dispense: 30 g; Refill: 0  12. Encounter for  immunization - Flu Vaccine Trivalent High Dose (Fluad)   Labs pending Continue current medications  Health Maintenance reviewed Diet and exercise encouraged  Follow up plan: 3 months    Jannifer Rodney, FNP

## 2023-02-11 LAB — COMPREHENSIVE METABOLIC PANEL
ALT: 19 [IU]/L (ref 0–32)
AST: 26 [IU]/L (ref 0–40)
Albumin: 4.5 g/dL (ref 3.8–4.8)
Alkaline Phosphatase: 63 [IU]/L (ref 44–121)
BUN/Creatinine Ratio: 25 (ref 12–28)
BUN: 29 mg/dL — ABNORMAL HIGH (ref 8–27)
Bilirubin Total: 0.4 mg/dL (ref 0.0–1.2)
CO2: 24 mmol/L (ref 20–29)
Calcium: 10.4 mg/dL — ABNORMAL HIGH (ref 8.7–10.3)
Chloride: 100 mmol/L (ref 96–106)
Creatinine, Ser: 1.17 mg/dL — ABNORMAL HIGH (ref 0.57–1.00)
Globulin, Total: 2.1 g/dL (ref 1.5–4.5)
Glucose: 119 mg/dL — ABNORMAL HIGH (ref 70–99)
Potassium: 4.6 mmol/L (ref 3.5–5.2)
Sodium: 139 mmol/L (ref 134–144)
Total Protein: 6.6 g/dL (ref 6.0–8.5)
eGFR: 48 mL/min/{1.73_m2} — ABNORMAL LOW (ref >59)

## 2023-02-25 ENCOUNTER — Encounter: Payer: Self-pay | Admitting: Pharmacist

## 2023-03-16 ENCOUNTER — Other Ambulatory Visit: Payer: Self-pay | Admitting: Family

## 2023-03-27 ENCOUNTER — Telehealth: Payer: Self-pay | Admitting: Pharmacist

## 2023-03-27 NOTE — Telephone Encounter (Signed)
    This patient was appearing on a report for being at risk of failing the adherence measure for cholesterol (statin) medications this calendar year.   Medication: atorvastatin Last fill date: 03/12/23 for 30 day supply Refill was sent in for 90--patient preferred 30  Reviewed medication indication, dosing, and goals of therapy.  NO action needed  Refilled 03/12/23   Kieth Brightly, PharmD, BCACP, CPP Clinical Pharmacist, [facility]

## 2023-04-02 DIAGNOSIS — Z79899 Other long term (current) drug therapy: Secondary | ICD-10-CM | POA: Diagnosis not present

## 2023-04-02 DIAGNOSIS — M461 Sacroiliitis, not elsewhere classified: Secondary | ICD-10-CM | POA: Diagnosis not present

## 2023-04-02 DIAGNOSIS — M961 Postlaminectomy syndrome, not elsewhere classified: Secondary | ICD-10-CM | POA: Diagnosis not present

## 2023-04-02 DIAGNOSIS — G894 Chronic pain syndrome: Secondary | ICD-10-CM | POA: Diagnosis not present

## 2023-04-02 DIAGNOSIS — Z5181 Encounter for therapeutic drug level monitoring: Secondary | ICD-10-CM | POA: Diagnosis not present

## 2023-04-15 ENCOUNTER — Other Ambulatory Visit: Payer: Self-pay | Admitting: Family

## 2023-05-11 ENCOUNTER — Telehealth: Payer: Self-pay | Admitting: Family

## 2023-05-11 NOTE — Telephone Encounter (Signed)
Copied from CRM [id number]. Topic: Clinical - Medication Question >> May 11, 2023  8:31 AM Patsy Lager T wrote: Reason for CRM: patient called to speak wth Vanice Sarah about a diabetic medication. Please f/u with patient

## 2023-05-14 ENCOUNTER — Telehealth: Payer: Self-pay | Admitting: Pharmacist

## 2023-05-14 DIAGNOSIS — M533 Sacrococcygeal disorders, not elsewhere classified: Secondary | ICD-10-CM | POA: Diagnosis not present

## 2023-05-14 DIAGNOSIS — M961 Postlaminectomy syndrome, not elsewhere classified: Secondary | ICD-10-CM | POA: Diagnosis not present

## 2023-05-14 DIAGNOSIS — G894 Chronic pain syndrome: Secondary | ICD-10-CM | POA: Diagnosis not present

## 2023-05-14 NOTE — Telephone Encounter (Signed)
   Patient would like to enroll in [employer] PAP for trulicity.  Will need to complete [employer] exception letter and attempt PAP.  Please route me encounter to re-enroll in novo nordisk PAP for Ozempic.  E-submitted online.  Victoza is no longer available. [place] for Farxiga.  Updated RX escribed to [facility] mail order (pharmacy for [place]).  Patient is stable on current regimen.  He will need to re-enroll for 2025 in November.

## 2023-05-14 NOTE — Telephone Encounter (Signed)
Returned call to patient Will proceed with trulicity app Needs exception letter

## 2023-05-21 NOTE — Telephone Encounter (Signed)
Patient would like to enroll in [employer] PAP for trulicity.  Will need to complete [employer] exception letter and attempt PAP.  She can't tolerate ozempic and victoza is no longer available.

## 2023-05-28 ENCOUNTER — Telehealth: Payer: Self-pay

## 2023-05-28 NOTE — Progress Notes (Deleted)
 Pharmacy Medication Assistance Program Note    06/02/2023  Patient ID: Sonya Cook, female   DOB: 05-21-1945, 78 y.o.   MRN: [id number]     05/28/2023  Outreach Medication One  Manufacturer Medication One [employer] [facility] Trulicity  Type of Assistance Manufacturer Assistance  Date Application Sent to Patient 06/02/2023  Application Items Requested Application  Date Application Sent to Prescriber 05/28/2023  Name of Prescriber Jannifer Rodney  Date Application Received From Provider 05/29/2023   NEW PAP  Pg 8 and exception letter rec'd.  Emailed patient pages to [name] 06/16/23 since not returned from patient yet.

## 2023-05-29 ENCOUNTER — Other Ambulatory Visit: Payer: Medicare Other | Admitting: Pharmacist

## 2023-06-04 ENCOUNTER — Encounter (HOSPITAL_COMMUNITY): Payer: Medicare Other

## 2023-06-04 ENCOUNTER — Ambulatory Visit: Payer: Medicare Other

## 2023-06-19 MED ORDER — TRULICITY 1.5 MG/0.5ML ~~LOC~~ SOAJ: SUBCUTANEOUS | 2 refills | Status: DC

## 2023-06-19 NOTE — Telephone Encounter (Signed)
 Novo nordisk enrollment approved 05/18/23 - 03/30/24   Med delivered 06/04/23 per rep. Will call back to cancel refills since patient cannot tolerate medication.

## 2023-06-19 NOTE — Telephone Encounter (Signed)
 Prescription sent to pharmacy.

## 2023-06-23 NOTE — Progress Notes (Signed)
 Pharmacy Medication Assistance Program Note    06/23/2023  Patient ID: Sonya Cook, female   DOB: June 30, 1945, 78 y.o.   MRN: [id number]     05/28/2023  Outreach Medication One  Manufacturer Medication One [employer] Trulicity  Type of Assistance Manufacturer Assistance  Date Application Sent to Patient 06/02/2023  Application Items Requested Application  Date Application Sent to Prescriber 05/28/2023  Name of Prescriber Jannifer Rodney  Date Application Received From Provider 05/29/2023  Date Application Submitted to Manufacturer 06/19/2023  Method Application Sent to Manufacturer Fax  Patient Assistance Determination Approved  Approval Start Date 06/23/2023  Approval End Date 03/30/2024     New med approved! Will ship to patients home.

## 2023-06-29 NOTE — Telephone Encounter (Addendum)
 Called to cancel refills of Ozempic.

## 2023-07-27 ENCOUNTER — Ambulatory Visit (HOSPITAL_COMMUNITY)
Admission: RE | Admit: 2023-07-27 | Discharge: 2023-07-27 | Disposition: A | Discharge status: Home / Self Care | Source: Ambulatory Visit | Attending: Vascular Surgery | Admitting: Vascular Surgery

## 2023-07-27 ENCOUNTER — Ambulatory Visit: Attending: Vascular Surgery | Admitting: Physician Assistant

## 2023-07-27 ENCOUNTER — Ambulatory Visit (HOSPITAL_BASED_OUTPATIENT_CLINIC_OR_DEPARTMENT_OTHER)
Admission: RE | Admit: 2023-07-27 | Discharge: 2023-07-27 | Disposition: A | Discharge status: Home / Self Care | Source: Ambulatory Visit | Attending: Vascular Surgery | Admitting: Vascular Surgery

## 2023-07-27 VITALS — BP 165/76 | HR 57 | Temp 97.9°F | Wt 137.8 lb

## 2023-07-27 DIAGNOSIS — I70223 Atherosclerosis of native arteries of extremities with rest pain, bilateral legs: Secondary | ICD-10-CM | POA: Diagnosis not present

## 2023-07-27 LAB — VAS US ABI WITH/WO TBI
Left ABI: 0.59
Right ABI: 1.02

## 2023-07-28 ENCOUNTER — Encounter: Payer: Self-pay | Admitting: Physician Assistant

## 2023-07-28 ENCOUNTER — Other Ambulatory Visit: Payer: Self-pay | Admitting: Family

## 2023-07-28 NOTE — Progress Notes (Signed)
 Office Note     CC:  follow up Requesting Provider:  Yevette Hem, FNP  HPI: Sonya Cook is a 78 y.o. (Jul 03, 1945) female who presents for surveillance of PAD.  She has history of right SFA stenting on 12/27/2021 by Dr. Shaunna [place].  Since this procedure, her claudication symptoms of the right leg have completely resolved.  She has not had any recurrent symptoms.  She is also without rest pain or tissue loss of bilateral lower extremities.  She continues to take aspirin , Plavix , statin daily.  She continues to be active and walks for exercise.  She is a former smoker.   Past Medical History:  Diagnosis Date   Anxiety    Chronic kidney disease    Depression    Diabetes mellitus without complication (HCC)    type II    Dyslipidemia    Family history of adverse reaction to anesthesia    daughter - slow to wake up    GERD (gastroesophageal reflux disease)    Hiatal hernia    Hyperlipidemia    Hypertension    Internal hemorrhoids    Osteopenia    PONV (postoperative nausea and vomiting)    Vitamin D  deficiency     Past Surgical History:  Procedure Laterality Date   ABDOMINAL AORTOGRAM W/LOWER EXTREMITY N/A 12/27/2021   Procedure: ABDOMINAL AORTOGRAM W/LOWER EXTREMITY;  Surgeon: Dannis Dy, MD;  Location: [facility] INVASIVE CV LAB;  Service: Cardiovascular;  Laterality: N/A;   ABDOMINAL HYSTERECTOMY     LUMBAR DISC SURGERY     PERIPHERAL VASCULAR INTERVENTION Right 12/27/2021   Procedure: PERIPHERAL VASCULAR INTERVENTION;  Surgeon: Dannis Dy, MD;  Location: [facility] INVASIVE CV LAB;  Service: Cardiovascular;  Laterality: Right;   right hand surgery for cyst      SHOULDER OPEN ROTATOR CUFF REPAIR Right 04/30/2017   Procedure: Right shoulder mini open rotator cuff repair;  Surgeon: Orvan Blanch, MD;  Location: [facility];  Service: Orthopedics;  Laterality: Right;  Interscalene Block    Social History   Socioeconomic History   Marital status: Married    Spouse name:  Bambi Lever   Number of children: 2   Years of education: Not on file   Highest education level: 12th grade  Occupational History   Occupation: Psychologist, 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  Tobacco Use   Smoking status: Former    Types: Cigarettes    Start date: 04/22/2000   Smokeless tobacco: Never  Vaping Use   Vaping status: Never Used  Substance and Sexual Activity   Alcohol use: Yes    Comment: occsional use   Drug use: No   Sexual activity: Not Currently  Other Topics Concern   Not on file  Social History Narrative   Lives home with husband and great granddaughter. Son lives 1 hour away   Social Drivers of Health   Financial Resource Strain: Low Risk  (02/06/2023)   Overall Financial Resource Strain (CARDIA)    Difficulty of Paying Living Expenses: Not very hard  Food Insecurity: No Food Insecurity (02/06/2023)   Hunger Vital Sign    Worried About Running Out of Food in the Last Year: Never true    Ran Out of Food in the Last Year: Never true  Transportation Needs: No Transportation Needs (02/06/2023)   PRAPARE - Administrator, Civil Service (Medical): No    Lack of Transportation (Non-Medical): No  Physical  Activity: Inactive (02/06/2023)   Exercise Vital Sign    Days of Exercise per Week: 0 days    Minutes of Exercise per Session: 0 min  Stress: Stress Concern Present (02/06/2023)   [employer] of Occupational Health - Occupational Stress Questionnaire    Feeling of Stress : To some extent  Social Connections: Moderately Isolated (02/06/2023)   Social Connection and Isolation Panel [NHANES]    Frequency of Communication with Friends and Family: Once a week    Frequency of Social Gatherings with Friends and Family: Once a week    Attends Religious Services: More than 4 times per year    Active Member of [employer] or Organizations: No    Attends Banker Meetings: Never    Marital Status:  Married  Catering manager Violence: Not At Risk (12/08/2022)   Received from [facility]   HITS    Over the last 12 months how often did your partner physically hurt you?: Never    Over the last 12 months how often did your partner insult you or talk down to you?: Never    Over the last 12 months how often did your partner threaten you with physical harm?: Never    Over the last 12 months how often did your partner scream or curse at you?: Never    Family History  Problem Relation Age of Onset   Hodgkin's lymphoma Mother    Atrial fibrillation Mother    Cancer Father        Lungs   Colon cancer Neg Hx    Diabetes Neg Hx    Stomach cancer Neg Hx    Rectal cancer Neg Hx     Current Outpatient Medications  Medication Sig Dispense Refill   acetaminophen  (TYLENOL ) 650 MG CR tablet Take 650-1,300 mg by mouth every 8 (eight) hours as needed for pain.     allopurinol  (ZYLOPRIM ) 100 MG tablet TAKE ONE (1) TABLET BY MOUTH EVERY DAY 90 tablet 1   aspirin  EC 81 MG tablet Take 81 mg by mouth every evening. Swallow whole.     atorvastatin  (LIPITOR) 40 MG tablet Take 1 tablet (40 mg total) by mouth every evening. 90 tablet 1   BELBUCA 750 MCG FILM Take by mouth 2 (two) times daily.     busPIRone  (BUSPAR ) 5 MG tablet TAKE ONE TABLET 3 TIMES A DAY AS NEEDED. 90 tablet 0   celecoxib (CELEBREX) 200 MG capsule Take by mouth 2 (two) times daily.     Cholecalciferol (VITAMIN D3) 50 MCG (2000 UT) TABS Take 2,000-4,000 Units by mouth See admin instructions. Take 1 tablet (2000 units) by mouth in the morning & take 2 tablets (4000 units) by mouth at night.     clopidogrel  (PLAVIX ) 75 MG tablet TAKE ONE (1) TABLET BY MOUTH EVERY DAY 90 tablet 0   Dulaglutide  (TRULICITY ) 1.5 MG/0.5ML SOAJ INJECT 1.5MG  INTO THE SKIN ONCE A WEEK 6 mL 2   DULoxetine  (CYMBALTA ) 60 MG capsule TAKE ONE (1) CAPSULE EACH DAY 90 capsule 0   lisinopril  (ZESTRIL ) 10 MG tablet Take 1 tablet (10 mg total) by mouth daily. 90 tablet 1    Multiple Vitamin (MULTIVITAMIN) LIQD Take 5 mLs by mouth daily.     NARCAN 4 MG/0.1ML LIQD nasal spray kit Place 1 spray into the nose as needed (accidental overdose).     Omega-3 Fatty Acids (FISH OIL MAXIMUM STRENGTH) 1200 MG CAPS Take 2,400-3,600 mg by mouth See admin instructions. Take 2 capsules (2400  mg) by mouth in the morning & 3 capsules (3600 mg) by mouth in the evening.     Omeprazole -Sodium Bicarbonate  (ZEGERID  OTC) 20-1100 MG CAPS capsule Take 1 capsule by mouth daily before breakfast.     ondansetron  (ZOFRAN ) 4 MG tablet TAKE 1 TABLET EVERY 8 HOURS AS NEEDED FOR NAUSEA AND VOMITING 90 tablet 1   Thiamine HCl (B-1 PO) Take 1 tablet by mouth every evening.     triamcinolone  ointment (KENALOG ) 0.5 % Apply 1 Application topically 2 (two) times daily. 30 g 0   tirzepatide (MOUNJARO) 5 MG/0.5ML Pen Inject 5 mg into the skin once a week. 6 mL 1   No current facility-administered medications for this visit.    Allergies  Allergen Reactions   Codeine Hives   Ibuprofen Other (See Comments)    Stomach bleed   Ozempic (0.25 Or 0.5 Mg-Dose) [Semaglutide(0.25 Or 0.5mg -Dos)] Nausea Only   Penicillins Hives    Has patient had a PCN reaction causing immediate rash, facial/tongue/throat swelling, SOB or lightheadedness with hypotension: Yes Has patient had a PCN reaction causing severe rash involving mucus membranes or skin necrosis: No Has patient had a PCN reaction that required hospitalization: No Has patient had a PCN reaction occurring within the last 10 years: No If all of the above answers are "NO", then may proceed with Cephalosporin use.    Jardiance  [Empagliflozin ]     Felt bad, couldn't get out of bed     REVIEW OF SYSTEMS:   [X]  denotes positive finding, [ ]  denotes negative finding Cardiac  Comments:  Chest pain or chest pressure:    Shortness of breath upon exertion:    Short of breath when lying flat:    Irregular heart rhythm:        Vascular    Pain in calf,  thigh, or hip brought on by ambulation:    Pain in feet at night that wakes you up from your sleep:     Blood clot in your veins:    Leg swelling:         Pulmonary    Oxygen at home:    Productive cough:     Wheezing:         Neurologic    Sudden weakness in arms or legs:     Sudden numbness in arms or legs:     Sudden onset of difficulty speaking or slurred speech:    Temporary loss of vision in one eye:     Problems with dizziness:         Gastrointestinal    Blood in stool:     Vomited blood:         Genitourinary    Burning when urinating:     Blood in urine:        Psychiatric    Major depression:         Hematologic    Bleeding problems:    Problems with blood clotting too easily:        Skin    Rashes or ulcers:        Constitutional    Fever or chills:      PHYSICAL EXAMINATION:  Vitals:   07/27/23 1503  BP: (!) 165/76  Pulse: (!) 57  Temp: 97.9 F (36.6 C)  TempSrc: Temporal  SpO2: 98%  Weight: 137 lb 12.8 oz (62.5 kg)    General:  WDWN in NAD; vital signs documented above Gait: Not observed HENT: WNL, normocephalic Pulmonary: normal non-labored  breathing , without Rales, rhonchi,  wheezing Cardiac: regular HR Abdomen: soft, NT, no masses Skin: without rashes Vascular Exam/Pulses: Palpable right PT pulse Extremities: without ischemic changes, without Gangrene , without cellulitis; without open wounds;  Musculoskeletal: no muscle wasting or atrophy  Neurologic: A&O X 3 Psychiatric:  The pt has Normal affect.   Non-Invasive Vascular Imaging:   Right lower extremity arterial duplex with patent SFA stent.  Mildly elevated velocity mid stent 288 cm/s   ABI/TBIToday's ABIToday's TBIPrevious ABIPrevious TBI  +-------+-----------+-----------+------------+------------+  Right 1.02       0.71       0.82        0.49          +-------+-----------+-----------+------------+------------+  Left  0.59       0.28       0.67        0.33             ASSESSMENT/PLAN:: 78 y.o. female here for follow up for surveillance of PAD with history of right SFA stent  Subjectively, the patient continues to do well overall.  Claudication symptoms of the right leg resolved after SFA stenting in 2023.  She has not had any recurrent symptoms.  Duplex demonstrates patent stent with mildly elevated velocities in the mid stent.  We will continue surveillance and repeat duplex in 6 months.  We will also repeat an ABI at that time.  She will continue her aspirin , Plavix , statin daily.  I also encouraged her to continue to walk for exercise.  She will notify the office if she develops any recurrent symptoms in the meantime.   Cordie Deters, PA-C [facility] [phone]  Clinic MD:   Charlotte Cookey

## 2023-07-31 ENCOUNTER — Telehealth: Payer: Self-pay

## 2023-07-31 ENCOUNTER — Other Ambulatory Visit (INDEPENDENT_AMBULATORY_CARE_PROVIDER_SITE_OTHER): Payer: Self-pay | Admitting: Pharmacist

## 2023-07-31 DIAGNOSIS — E1122 Type 2 diabetes mellitus with diabetic chronic kidney disease: Secondary | ICD-10-CM

## 2023-07-31 DIAGNOSIS — E785 Hyperlipidemia, unspecified: Secondary | ICD-10-CM

## 2023-07-31 DIAGNOSIS — Z7985 Long-term (current) use of injectable non-insulin antidiabetic drugs: Secondary | ICD-10-CM

## 2023-07-31 DIAGNOSIS — N183 Chronic kidney disease, stage 3 unspecified: Secondary | ICD-10-CM

## 2023-07-31 MED ORDER — TIRZEPATIDE 5 MG/0.5ML ~~LOC~~ SOAJ
5.0000 mg | SUBCUTANEOUS | 1 refills | Status: DC
Start: 2023-07-31 — End: 2023-10-05

## 2023-07-31 NOTE — Telephone Encounter (Signed)
 Copied from CRM [id number]. Topic: Clinical - Medication Question >> Jul 31, 2023  9:54 AM Arlie Benedict B wrote: Reason for CRM: patient states she needs to speak to someone in regards to the medication listed below [phone] Dulaglutide  (TRULICITY ) 1.5 MG/0.5ML SOAJ

## 2023-07-31 NOTE — Progress Notes (Addendum)
 07/31/2023 Name: Sonya Cook MRN: [id number] DOB: March 12, 1946  Chief Complaint  Patient presents with   Medication Management    Diabetes    Sonya Cook is a 78 y.o. year old female who presented for a telephone visit.   They were referred to the pharmacist by their PCP for assistance in managing diabetes.    Subjective:  Care Team: Primary Care Provider: Yevette Hem, FNP    Medication Access/Adherence  Current Pharmacy:  [facility] - [place] [facility], [place] - [address] [place] [address] Phone: [phone] Fax: [phone]  [facility] - [place], [place] - [address] [place] [address] Phone: [phone] Fax: [phone]  [facility] - [place], [place] - [address] [place] [address] Phone: [phone] Fax: [phone]   Patient reports affordability concerns with their medications: has now meet out of pocket max; meds free until end of year Patient reports access/transportation concerns to their pharmacy: No  Patient reports adherence concerns with their medications:  No     Diabetes:  Current medications: Trulicity  -->Mounjaro 5mg  weekly Medications tried in the past: Victoza , Ozempic, trulicity , Jardiance ; GFR 48 Discontinued jardiance  (patient reported she felt bad on medication-weak, couldn'Sonya get out of bed) Discontinued ozempic due to weakness/fatigue/flu like symptoms (even at very low doses)  Current glucose readings: FBG<140 Using traditional glucometer  Patient denies hypoglycemic s/sx including dizziness, shakiness, sweating. Patient denies hyperglycemic symptoms including polyuria, polydipsia, polyphagia, nocturia, neuropathy, blurred vision.  Current meal patterns:  Discussed meal planning options and Plate method for healthy eating Avoid sugary drinks and desserts Incorporate balanced protein, non  starchy veggies, 1 serving of carbohydrate with each meal Increase water intake Increase physical activity as able  Current physical activity: encouraged as able  Current medication access support: medicare has met out of pocket MAX   Objective:  Lab Results  Component Value Date   HGBA1C 6.0 (H) 02/10/2023   Lab Results  Component Value Date   CREATININE 1.17 (H) 02/10/2023   BUN 29 (H) 02/10/2023   NA 139 02/10/2023   K 4.6 02/10/2023   CL 100 02/10/2023   CO2 24 02/10/2023    Lab Results  Component Value Date   CHOL 165 08/05/2022   HDL 46 08/05/2022   LDLCALC 89 08/05/2022   TRIG 175 (H) 08/05/2022   CHOLHDL 3.6 08/05/2022    Medications Reviewed Today     Reviewed by Delilah Fend, [facility] (Pharmacist) on 08/04/23 at 1633  Med List Status: <None>   Medication Order Taking? Sig Documenting Provider Last Dose Status Informant  acetaminophen  (TYLENOL ) 650 MG CR tablet [id number] No Take 650-1,300 mg by mouth every 8 (eight) hours as needed for pain. [provider] Taking Active Self  allopurinol  (ZYLOPRIM ) 100 MG tablet [id number] No TAKE ONE (1) TABLET BY MOUTH EVERY DAY Yevette Hem, FNP Taking Active   aspirin  EC 81 MG tablet [id number] No Take 81 mg by mouth every evening. Swallow whole. [provider] Taking Active Self  atorvastatin  (LIPITOR) 40 MG tablet [id number] No Take 1 tablet (40 mg total) by mouth every evening. Yevette Hem, FNP Taking Active   BELBUCA 750 MCG FILM [id number] No Take by mouth 2 (two) times daily. [provider] Taking Active   busPIRone  (BUSPAR ) 5 MG tablet [id number]  TAKE ONE TABLET 3 TIMES A DAY AS NEEDED. Hawks, [place] A,  FNP  Active   celecoxib (CELEBREX) 200 MG capsule [id number] No Take by mouth 2 (two) times daily. [provider] Taking Active   Cholecalciferol (VITAMIN D3) 50 MCG (2000 UT) TABS [id number] No Take 2,000-4,000 Units by mouth See admin instructions. Take 1 tablet (2000 units)  by mouth in the morning & take 2 tablets (4000 units) by mouth at night. [provider] Taking Active Self  clopidogrel  (PLAVIX ) 75 MG tablet [id number] No TAKE ONE (1) TABLET BY MOUTH EVERY DAY Hawks, Christy A, FNP Taking Active   DULoxetine  (CYMBALTA ) 60 MG capsule [id number] No TAKE ONE (1) CAPSULE EACH DAY Hawks, [place] A, FNP Taking Active   lisinopril  (ZESTRIL ) 10 MG tablet [id number] No Take 1 tablet (10 mg total) by mouth daily. Yevette Hem, FNP Taking Active   Multiple Vitamin (MULTIVITAMIN) LIQD [id number] No Take 5 mLs by mouth daily. [provider] Taking Active Self  NARCAN 4 MG/0.1ML LIQD nasal spray kit [id number] No Place 1 spray into the nose as needed (accidental overdose). [provider] Taking Active Self           Med Note SHRUTHI, WACHOWIAK Dec 23, 2021 10:54 AM) On hand  Omega-3 Fatty Acids (FISH OIL MAXIMUM STRENGTH) 1200 MG CAPS [id number] No Take 2,400-3,600 mg by mouth See admin instructions. Take 2 capsules (2400 mg) by mouth in the morning & 3 capsules (3600 mg) by mouth in the evening. [provider] Taking Active Self  Omeprazole -Sodium Bicarbonate  (ZEGERID  OTC) 20-1100 MG CAPS capsule [id number] No Take 1 capsule by mouth daily before breakfast. [provider] Taking Active Self  ondansetron  (ZOFRAN ) 4 MG tablet [id number] No TAKE 1 TABLET EVERY 8 HOURS AS NEEDED FOR NAUSEA AND VOMITING Tommas Fragmin A, FNP Taking Active   Thiamine HCl (B-1 PO) [id number] No Take 1 tablet by mouth every evening. [provider] Taking Active Self  tirzepatide Florence Hunt) 5 MG/0.5ML Pen [id number]  Inject 5 mg into the skin once a week. Tommas Fragmin A, FNP  Active   triamcinolone  ointment (KENALOG ) 0.5 % [id number] No Apply 1 Application topically 2 (two) times daily. Yevette Hem, FNP Taking Active             Assessment/Plan:   Diabetes: - Currently controlled--transition patient back to Mounjaro 5mg  weekly;  her cost is now $0 as she has met $2k out of pocket MAX.  Cancel lilly cares trulicity  PAP (message routed to CPhT) -Sent in statin 90 days for adherence (pt usually prefers 30 days) - Reviewed long term cardiovascular and renal outcomes of uncontrolled blood sugar - Reviewed goal A1c, goal fasting, and goal 2 hour post prandial glucose - Patient denies personal or family history of multiple endocrine neoplasia type 2, medullary thyroid  cancer; personal history of pancreatitis or gallbladder disease. - Recommend to check glucose daily (fasting) or if symptomatic   Follow Up Plan: A1c within 2 months, last drawn 01/2023 (controlled)   Marvell Slider, PharmD, BCACP, CPP Clinical Pharmacist, [facility]   30 min of patient care was provided to the patient during this visit time .  NO charge visit   I have reviewed and agree with the above  documentation.   Tommas Fragmin, FNP

## 2023-07-31 NOTE — Telephone Encounter (Signed)
 Returned call to patient; see separate pharmD encounter

## 2023-08-04 ENCOUNTER — Telehealth: Payer: Self-pay | Admitting: Pharmacist

## 2023-08-04 MED ORDER — ATORVASTATIN CALCIUM 40 MG PO TABS: 40.0000 mg | ORAL_TABLET | Freq: Every evening | ORAL | 2 refills | Status: DC

## 2023-08-04 NOTE — Telephone Encounter (Signed)
 Patient has now met deductible and Mounjaro is $0 [employer] PAP She has tolerated Mounjaro the best and will transition patient back to Mounjaro 5mg  weekly Patient verbalizes understanding Appreciate assistance

## 2023-08-06 ENCOUNTER — Other Ambulatory Visit: Payer: Self-pay | Admitting: *Deleted

## 2023-08-06 DIAGNOSIS — I70219 Atherosclerosis of native arteries of extremities with intermittent claudication, unspecified extremity: Secondary | ICD-10-CM

## 2023-08-06 DIAGNOSIS — I70223 Atherosclerosis of native arteries of extremities with rest pain, bilateral legs: Secondary | ICD-10-CM

## 2023-08-07 NOTE — Telephone Encounter (Signed)
 Spoke with Sonya Cook cares, patient application withdrawn.

## 2023-08-10 DIAGNOSIS — M533 Sacrococcygeal disorders, not elsewhere classified: Secondary | ICD-10-CM | POA: Diagnosis not present

## 2023-08-10 DIAGNOSIS — G894 Chronic pain syndrome: Secondary | ICD-10-CM | POA: Diagnosis not present

## 2023-08-10 DIAGNOSIS — Z5181 Encounter for therapeutic drug level monitoring: Secondary | ICD-10-CM | POA: Diagnosis not present

## 2023-08-10 DIAGNOSIS — M961 Postlaminectomy syndrome, not elsewhere classified: Secondary | ICD-10-CM | POA: Diagnosis not present

## 2023-08-10 DIAGNOSIS — Z79899 Other long term (current) drug therapy: Secondary | ICD-10-CM | POA: Diagnosis not present

## 2023-08-11 NOTE — Progress Notes (Signed)
 done

## 2023-08-19 ENCOUNTER — Telehealth: Payer: Self-pay | Admitting: Family

## 2023-08-31 ENCOUNTER — Other Ambulatory Visit: Payer: Self-pay | Admitting: Family

## 2023-08-31 DIAGNOSIS — I10 Essential (primary) hypertension: Secondary | ICD-10-CM

## 2023-09-04 ENCOUNTER — Telehealth: Payer: Self-pay | Admitting: Pharmacist

## 2023-09-04 NOTE — Telephone Encounter (Signed)
   This patient is appearing on a report for being at risk of failing the adherence measure for diabetes medications this calendar year.   Medication: Mounjaro Last fill date: 08/31/23 for 28 day supply  Insurance report was not up to date. No action needed at this time.    [place] Vanaken Dattero Yalexa Blust, PharmD, BCACP, CPP Clinical Pharmacist, [facility]

## 2023-09-29 ENCOUNTER — Other Ambulatory Visit: Payer: Self-pay | Admitting: Family

## 2023-10-05 ENCOUNTER — Ambulatory Visit: Admitting: Family

## 2023-10-05 VITALS — BP 135/73 | HR 70 | Temp 97.2°F | Ht 62.0 in | Wt 135.0 lb

## 2023-10-05 DIAGNOSIS — I700 Atherosclerosis of aorta: Secondary | ICD-10-CM | POA: Diagnosis not present

## 2023-10-05 DIAGNOSIS — G8929 Other chronic pain: Secondary | ICD-10-CM

## 2023-10-05 DIAGNOSIS — Z0000 Encounter for general adult medical examination without abnormal findings: Secondary | ICD-10-CM | POA: Diagnosis not present

## 2023-10-05 DIAGNOSIS — N1831 Chronic kidney disease, stage 3a: Secondary | ICD-10-CM

## 2023-10-05 DIAGNOSIS — Z0001 Encounter for general adult medical examination with abnormal findings: Secondary | ICD-10-CM

## 2023-10-05 DIAGNOSIS — F331 Major depressive disorder, recurrent, moderate: Secondary | ICD-10-CM | POA: Diagnosis not present

## 2023-10-05 DIAGNOSIS — I10 Essential (primary) hypertension: Secondary | ICD-10-CM

## 2023-10-05 DIAGNOSIS — N183 Chronic kidney disease, stage 3 unspecified: Secondary | ICD-10-CM | POA: Diagnosis not present

## 2023-10-05 DIAGNOSIS — E785 Hyperlipidemia, unspecified: Secondary | ICD-10-CM | POA: Diagnosis not present

## 2023-10-05 DIAGNOSIS — E1122 Type 2 diabetes mellitus with diabetic chronic kidney disease: Secondary | ICD-10-CM | POA: Diagnosis not present

## 2023-10-05 DIAGNOSIS — M545 Low back pain, unspecified: Secondary | ICD-10-CM | POA: Diagnosis not present

## 2023-10-05 DIAGNOSIS — M5136 Other intervertebral disc degeneration, lumbar region with discogenic back pain only: Secondary | ICD-10-CM

## 2023-10-05 DIAGNOSIS — K219 Gastro-esophageal reflux disease without esophagitis: Secondary | ICD-10-CM | POA: Diagnosis not present

## 2023-10-05 DIAGNOSIS — K5900 Constipation, unspecified: Secondary | ICD-10-CM

## 2023-10-05 LAB — BAYER DCA HB A1C WAIVED: HB A1C (BAYER DCA - WAIVED): 5.5 % (ref 4.8–5.6)

## 2023-10-05 MED ORDER — TIRZEPATIDE 2.5 MG/0.5ML ~~LOC~~ SOAJ: 2.5000 mg | SUBCUTANEOUS | 3 refills | Status: DC

## 2023-10-05 MED ORDER — LISINOPRIL 10 MG PO TABS: 10.0000 mg | ORAL_TABLET | Freq: Every day | ORAL | 4 refills | Status: AC

## 2023-10-05 MED ORDER — LINACLOTIDE 72 MCG PO CAPS: 72.0000 ug | ORAL_CAPSULE | Freq: Every day | ORAL | 1 refills | Status: DC

## 2023-10-05 MED ORDER — ONDANSETRON HCL 4 MG PO TABS: ORAL_TABLET | ORAL | 1 refills | Status: AC

## 2023-10-05 NOTE — Progress Notes (Signed)
 Subjective:    Patient ID: Sonya Cook, female    DOB: [birth year], 78 y.o.   MRN: [id number]  Chief Complaint  Patient presents with   Medical Management of Chronic Issues   PT presents to the office today for CPE and chronic follow up.   PT is followed by Pain Management for chronic back pain monthly. She states her pain management provider needs a letter stating she is medically stable.    She has aortic atherosclerosis and takes Lipitor daily.    She has CKD and avoids NSAID's.    Has aortic atherosclerosis and taking Lipitor 40 mg daily. She is followed by Vascular for PAD. She had aortogram of right lower leg on 12/27/21. Reports her right leg pain is a 0.   Has a hx of gout, but has not had a flare up in over a year. Taking allopurinol  100 mg daily.    Hypertension This is a chronic problem. The current episode started more than 1 year ago. The problem has been resolved since onset. The problem is controlled. Associated symptoms include blurred vision. Pertinent negatives include no malaise/fatigue, peripheral edema or shortness of breath. Risk factors for coronary artery disease include dyslipidemia, diabetes mellitus, sedentary lifestyle and post-menopausal state. The current treatment provides moderate improvement. Hypertensive end-organ damage includes PVD.  Gastroesophageal Reflux She complains of belching and heartburn. This is a chronic problem. The current episode started more than 1 year ago. The problem occurs occasionally. The symptoms are aggravated by certain foods. She has tried a PPI for the symptoms. The treatment provided moderate relief.  Diabetes She presents for her follow-up diabetic visit. She has type 2 diabetes mellitus. Associated symptoms include blurred vision. Pertinent negatives for diabetes include no foot paresthesias. Symptoms are stable. Diabetic complications include PVD. Risk factors for coronary artery disease include dyslipidemia, diabetes  mellitus, hypertension, post-menopausal and sedentary lifestyle. She is following a generally healthy diet. Her overall blood glucose range is 110-130 mg/dl. Eye exam is current.  Back Pain This is a chronic problem. The current episode started more than 1 year ago. The problem occurs intermittently. The pain is present in the lumbar spine. The quality of the pain is described as aching. The pain is at a severity of 7/10. The pain is moderate. Risk factors include obesity. She has tried analgesics for the symptoms. The treatment provided moderate relief.  Hyperlipidemia This is a chronic problem. The current episode started more than 1 year ago. The problem is controlled. Recent lipid tests were reviewed and are normal. Exacerbating diseases include obesity. Pertinent negatives include no shortness of breath. Current antihyperlipidemic treatment includes statins. The current treatment provides moderate improvement of lipids. Risk factors for coronary artery disease include dyslipidemia, hypertension, a sedentary lifestyle and post-menopausal.  Depression        This is a chronic problem.  The current episode started more than 1 year ago.   The problem occurs intermittently.  Associated symptoms include sad.  Associated symptoms include no helplessness and no hopelessness.  Past treatments include SNRIs - Serotonin and norepinephrine reuptake inhibitors. Constipation This is a chronic problem. The current episode started more than 1 year ago. Her stool frequency is 2 to 3 times per week. Associated symptoms include back pain. She has tried laxatives and stool softeners for the symptoms. The treatment provided mild relief.      Review of Systems  Constitutional:  Negative for malaise/fatigue.  Eyes:  Positive for blurred vision.  Respiratory:  Negative for shortness of breath.   Gastrointestinal:  Positive for constipation and heartburn.  Musculoskeletal:  Positive for back pain.  All other systems  reviewed and are negative.  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 Son lives 1 hour away   Social Drivers of Health   Financial Resource Strain: Low Risk  (10/05/2023)   Overall Financial Resource Strain (CARDIA)    Difficulty of Paying Living Expenses: Not hard at all  Food Insecurity: No Food Insecurity (10/05/2023)   Hunger Vital Sign    Worried About Running Out of Food in the Last Year: Never true    Ran Out of Food in the Last Year: Never true  Transportation Needs: No Transportation Needs (10/05/2023)   PRAPARE - Administrator, Civil Service (Medical): No    Lack of Transportation (Non-Medical): No  Physical Activity: Insufficiently Active (10/05/2023)   Exercise Vital Sign    Days of Exercise per Week: 7 days    Minutes of Exercise per Session: 20 min  Stress: Stress Concern Present (10/05/2023)   [employer] of Occupational Health - Occupational Stress Questionnaire    Feeling of Stress: To some extent  Social Connections: Moderately Isolated (10/05/2023)    Social Connection and Isolation Panel    Frequency of Communication with Friends and Family: Once a week    Frequency of Social Gatherings with Friends and Family: Once a week    Attends Religious Services: More than 4 times per year    Active Member of [employer] or Organizations: No    Attends Engineer, structural: Not on file    Marital Status: Married       Objective:   Physical Exam Vitals reviewed.  Constitutional:      General: She is not in acute distress.    Appearance: She is well-developed.  HENT:     Head: Normocephalic and atraumatic.     Right Ear: Tympanic membrane normal.     Left Ear: Tympanic membrane normal.  Eyes:     Pupils: Pupils are equal, round, and reactive to light.  Neck:     Thyroid : No thyromegaly.  Cardiovascular:     Rate and Rhythm: Normal rate and regular rhythm.     Heart sounds: Normal heart sounds. No murmur heard. Pulmonary:     Effort: Pulmonary  effort is normal. No respiratory distress.     Breath sounds: Normal breath sounds. No wheezing.  Abdominal:     General: Bowel sounds are normal. There is no distension.     Palpations: Abdomen is soft.     Tenderness: There is no abdominal tenderness.  Musculoskeletal:        General: No tenderness. Normal range of motion.     Cervical back: Normal range of motion and neck supple.  Skin:    General: Skin is warm and dry.  Neurological:     Mental Status: She is alert and oriented to person, place, and time.     Cranial Nerves: No cranial nerve deficit.     Deep Tendon Reflexes: Reflexes are normal and symmetric.  Psychiatric:        Behavior: Behavior normal.        Thought Content: Thought content normal.        Judgment: Judgment normal.      Diabetic Foot Exam - Simple   Simple Foot Form Diabetic Foot exam was performed with the following findings: Yes 10/05/2023  3:51 PM  Visual Inspection No deformities, no ulcerations, no other skin breakdown bilaterally: Yes Sensation  Testing Intact to touch and monofilament testing bilaterally: Yes Pulse Check Posterior Tibialis and Dorsalis pulse intact bilaterally: Yes Comments       BP 135/73   Pulse 70   Temp (!) 97.2 F (36.2 C) (Temporal)   Ht 5' 2 (1.575 m)   Wt 135 lb (61.2 kg)   SpO2 97%   BMI 24.69 kg/m   Assessment & Plan:  Sonya Cook comes in today with chief complaint of Medical Management of Chronic Issues   Diagnosis and orders addressed:  1. Annual physical exam (Primary) - CBC with Differential/Platelet - CMP14+EGFR  2. Moderate episode of recurrent major depressive disorder (HCC) - CBC with Differential/Platelet - CMP14+EGFR  3. Type 2 diabetes mellitus with stage 3 chronic kidney disease, without long-term current use of insulin, unspecified whether stage 3a or 3b CKD (HCC) - Bayer DCA Hb A1c Waived - CBC with Differential/Platelet - CMP14+EGFR - Microalbumin / creatinine urine ratio - ondansetron  (ZOFRAN ) 4 MG tablet; TAKE 1 TABLET EVERY 8 HOURS AS NEEDED FOR NAUSEA AND VOMITING  Dispense: 90 tablet; Refill: 1 - tirzepatide  (MOUNJARO ) 2.5 MG/0.5ML Pen; Inject 2.5 mg into the skin once a week.  Dispense: 2 mL; Refill: 3  4. Hyperlipidemia, unspecified hyperlipidemia type - CBC with Differential/Platelet - Lipid panel - CMP14+EGFR  5. Aortic atherosclerosis (HCC) - CBC with Differential/Platelet - CMP14+EGFR  6. Chronic low back pain, unspecified back pain laterality, unspecified whether sciatica present - CBC with Differential/Platelet - CMP14+EGFR  7. Gastroesophageal reflux disease, unspecified whether esophagitis present - CBC with Differential/Platelet - CMP14+EGFR  8. Primary hypertension  - CBC with Differential/Platelet - CMP14+EGFR  9. Stage 3a chronic kidney disease (HCC) - CBC with Differential/Platelet - CMP14+EGFR  10. Degeneration of intervertebral disc of lumbar region with discogenic back pain - CBC with Differential/Platelet -  CMP14+EGFR  11. Hypertension, unspecified type - CBC with Differential/Platelet - CMP14+EGFR - lisinopril  (ZESTRIL ) 10 MG tablet; Take 1 tablet (10 mg total) by mouth daily.  Dispense: 90 tablet; Refill: 4  12. Constipation, unspecified constipation type - CBC with Differential/Platelet - CMP14+EGFR - linaclotide  (LINZESS ) 72 MCG capsule; Take 1 capsule (72 mcg total) by mouth daily before breakfast.  Dispense: 90 capsule; Refill: 1   Labs pending Will decrease Mounjaro  to 2.5 mg from  5 mg because of nausea. Will refill zofran .  Start linzess  72 mcg Continue current medications  Health Maintenance reviewed Diet and exercise encouraged  Follow up plan: 3 months    Bari Learn, FNP

## 2023-10-05 NOTE — Patient Instructions (Signed)

## 2023-10-06 ENCOUNTER — Ambulatory Visit: Payer: Self-pay | Admitting: Family

## 2023-10-06 LAB — LIPID PANEL
Chol/HDL Ratio: 3 ratio (ref 0.0–4.4)
Cholesterol, Total: 152 mg/dL (ref 100–199)
HDL: 51 mg/dL (ref >39)
LDL Chol Calc (NIH): 70 mg/dL (ref 0–99)
Triglycerides: 189 mg/dL — ABNORMAL HIGH (ref 0–149)
VLDL Cholesterol Cal: 31 mg/dL (ref 5–40)

## 2023-10-06 LAB — CMP14+EGFR
ALT: 18 IU/L (ref 0–32)
AST: 26 IU/L (ref 0–40)
Albumin: 4.8 g/dL (ref 3.8–4.8)
Alkaline Phosphatase: 69 IU/L (ref 44–121)
BUN/Creatinine Ratio: 24 (ref 12–28)
BUN: 23 mg/dL (ref 8–27)
Bilirubin Total: 0.3 mg/dL (ref 0.0–1.2)
CO2: 23 mmol/L (ref 20–29)
Calcium: 10.3 mg/dL (ref 8.7–10.3)
Chloride: 101 mmol/L (ref 96–106)
Creatinine, Ser: 0.95 mg/dL (ref 0.57–1.00)
Globulin, Total: 2 g/dL (ref 1.5–4.5)
Glucose: 84 mg/dL (ref 70–99)
Potassium: 4.8 mmol/L (ref 3.5–5.2)
Sodium: 139 mmol/L (ref 134–144)
Total Protein: 6.8 g/dL (ref 6.0–8.5)
eGFR: 62 mL/min/1.73 (ref >59)

## 2023-10-06 LAB — CBC WITH DIFFERENTIAL/PLATELET
Basophils Absolute: 0 x10E3/uL (ref 0.0–0.2)
Basos: 1 %
EOS (ABSOLUTE): 0.2 x10E3/uL (ref 0.0–0.4)
Eos: 3 %
Hematocrit: 39.5 % (ref 34.0–46.6)
Hemoglobin: 13 g/dL (ref 11.1–15.9)
Immature Grans (Abs): 0 x10E3/uL (ref 0.0–0.1)
Immature Granulocytes: 0 %
Lymphocytes Absolute: 2 x10E3/uL (ref 0.7–3.1)
Lymphs: 24 %
MCH: 32.6 pg (ref 26.6–33.0)
MCHC: 32.9 g/dL (ref 31.5–35.7)
MCV: 99 fL — ABNORMAL HIGH (ref 79–97)
Monocytes Absolute: 0.7 x10E3/uL (ref 0.1–0.9)
Monocytes: 8 %
Neutrophils Absolute: 5.6 x10E3/uL (ref 1.4–7.0)
Neutrophils: 64 %
Platelets: 197 x10E3/uL (ref 150–450)
RBC: 3.99 x10E6/uL (ref 3.77–5.28)
RDW: 13.5 % (ref 11.7–15.4)
WBC: 8.5 x10E3/uL (ref 3.4–10.8)

## 2023-10-06 LAB — MICROALBUMIN / CREATININE URINE RATIO
Creatinine, Urine: 120.5 mg/dL
Microalb/Creat Ratio: 8 mg/g{creat} (ref 0–29)
Microalbumin, Urine: 9.9 ug/mL

## 2023-10-19 DIAGNOSIS — M533 Sacrococcygeal disorders, not elsewhere classified: Secondary | ICD-10-CM | POA: Diagnosis not present

## 2023-10-19 DIAGNOSIS — G894 Chronic pain syndrome: Secondary | ICD-10-CM | POA: Diagnosis not present

## 2023-10-19 DIAGNOSIS — M461 Sacroiliitis, not elsewhere classified: Secondary | ICD-10-CM | POA: Diagnosis not present

## 2023-10-20 ENCOUNTER — Other Ambulatory Visit: Payer: Self-pay | Admitting: Family

## 2023-10-20 DIAGNOSIS — M1A9XX0 Chronic gout, unspecified, without tophus (tophi): Secondary | ICD-10-CM

## 2023-11-01 ENCOUNTER — Other Ambulatory Visit: Payer: Self-pay | Admitting: Family

## 2023-11-24 ENCOUNTER — Ambulatory Visit

## 2023-12-14 DIAGNOSIS — M461 Sacroiliitis, not elsewhere classified: Secondary | ICD-10-CM | POA: Diagnosis not present

## 2023-12-14 DIAGNOSIS — M533 Sacrococcygeal disorders, not elsewhere classified: Secondary | ICD-10-CM | POA: Diagnosis not present

## 2023-12-14 DIAGNOSIS — G894 Chronic pain syndrome: Secondary | ICD-10-CM | POA: Diagnosis not present

## 2023-12-14 DIAGNOSIS — M961 Postlaminectomy syndrome, not elsewhere classified: Secondary | ICD-10-CM | POA: Diagnosis not present

## 2023-12-22 ENCOUNTER — Other Ambulatory Visit: Payer: Self-pay

## 2023-12-22 NOTE — Progress Notes (Signed)
 Pharmacy Quality Measure Review  This patient is appearing on a report for being at risk of failing the adherence measure for diabetes medications this calendar year.   Medication: Mounjaro  2.5 mg weekly Last fill date: 12/09/23 for 28 day supply  Called patient to verify adherence with medication, since the recent fill did not populate into Epic. She did fill her Mounjaro  on the 19th and reports tolerating it well.   No further intervention needed.   Woodie Jock, PharmD PGY1 Pharmacy Resident  12/22/2023

## 2024-01-04 ENCOUNTER — Other Ambulatory Visit: Payer: Self-pay | Admitting: Family

## 2024-01-04 DIAGNOSIS — N183 Chronic kidney disease, stage 3 unspecified: Secondary | ICD-10-CM

## 2024-01-05 ENCOUNTER — Encounter: Payer: Self-pay | Admitting: Family

## 2024-01-05 ENCOUNTER — Ambulatory Visit: Admitting: Family

## 2024-01-05 ENCOUNTER — Other Ambulatory Visit: Payer: Self-pay | Admitting: Family

## 2024-01-05 VITALS — BP 136/70 | HR 62 | Temp 97.0°F | Ht 62.0 in | Wt 137.4 lb

## 2024-01-05 DIAGNOSIS — M5136 Other intervertebral disc degeneration, lumbar region with discogenic back pain only: Secondary | ICD-10-CM | POA: Diagnosis not present

## 2024-01-05 DIAGNOSIS — K5900 Constipation, unspecified: Secondary | ICD-10-CM | POA: Insufficient documentation

## 2024-01-05 DIAGNOSIS — K219 Gastro-esophageal reflux disease without esophagitis: Secondary | ICD-10-CM | POA: Diagnosis not present

## 2024-01-05 DIAGNOSIS — E785 Hyperlipidemia, unspecified: Secondary | ICD-10-CM | POA: Diagnosis not present

## 2024-01-05 DIAGNOSIS — F331 Major depressive disorder, recurrent, moderate: Secondary | ICD-10-CM | POA: Diagnosis not present

## 2024-01-05 DIAGNOSIS — Z23 Encounter for immunization: Secondary | ICD-10-CM

## 2024-01-05 DIAGNOSIS — M1A9XX0 Chronic gout, unspecified, without tophus (tophi): Secondary | ICD-10-CM | POA: Diagnosis not present

## 2024-01-05 DIAGNOSIS — N1831 Chronic kidney disease, stage 3a: Secondary | ICD-10-CM | POA: Diagnosis not present

## 2024-01-05 DIAGNOSIS — M8580 Other specified disorders of bone density and structure, unspecified site: Secondary | ICD-10-CM

## 2024-01-05 DIAGNOSIS — I10 Essential (primary) hypertension: Secondary | ICD-10-CM

## 2024-01-05 DIAGNOSIS — E1122 Type 2 diabetes mellitus with diabetic chronic kidney disease: Secondary | ICD-10-CM

## 2024-01-05 DIAGNOSIS — I700 Atherosclerosis of aorta: Secondary | ICD-10-CM

## 2024-01-05 DIAGNOSIS — M545 Low back pain, unspecified: Secondary | ICD-10-CM

## 2024-01-05 MED ORDER — ALLOPURINOL 100 MG PO TABS: 100.0000 mg | ORAL_TABLET | Freq: Every day | ORAL | 2 refills | Status: AC

## 2024-01-05 MED ORDER — LINACLOTIDE 145 MCG PO CAPS
145.0000 ug | ORAL_CAPSULE | Freq: Every day | ORAL | 4 refills | Status: DC
Start: 2024-01-05 — End: 2024-02-11

## 2024-01-05 NOTE — Progress Notes (Signed)
 Subjective:    Patient ID: Sonya Cook, female    DOB: February 19, 1946, 78 y.o.   MRN: [id number]  Chief Complaint  Patient presents with   Medical Management of Chronic Issues   PT presents to the office today for  chronic follow up.   PT is followed by Pain Management for chronic back pain monthly. She states her pain management provider needs a letter stating she is medically stable.    She has aortic atherosclerosis and takes Lipitor daily.    She has CKD and avoids NSAID's.    Has aortic atherosclerosis and taking Lipitor 40 mg daily. She is followed by Vascular for PAD. She had aortogram of right lower leg on 12/27/21. Reports her right leg pain is a 0.   Has a hx of gout, but has not had a flare up in over a year. Taking allopurinol  100 mg daily.    She has osteopenia and takes calcium  and vit D. Her last dexa scan was  02/04/22.   Hypertension This is a chronic problem. The current episode started more than 1 year ago. The problem has been waxing and waning since onset. The problem is uncontrolled. Associated symptoms include blurred vision (some times) and malaise/fatigue. Pertinent negatives include no peripheral edema or shortness of breath. Risk factors for coronary artery disease include dyslipidemia, diabetes mellitus, sedentary lifestyle and post-menopausal state. The current treatment provides moderate improvement. Hypertensive end-organ damage includes PVD.  Gastroesophageal Reflux She complains of belching and heartburn. This is a chronic problem. The current episode started more than 1 year ago. The problem occurs occasionally. The symptoms are aggravated by certain foods. She has tried a PPI for the symptoms. The treatment provided moderate relief.  Diabetes She presents for her follow-up diabetic visit. She has type 2 diabetes mellitus. Associated symptoms include blurred vision (some times). Pertinent negatives for diabetes include no foot paresthesias. Symptoms are  stable. Diabetic complications include PVD. Risk factors for coronary artery disease include dyslipidemia, diabetes mellitus, hypertension, post-menopausal and sedentary lifestyle. She is following a generally healthy diet. Her overall blood glucose range is 90-110 mg/dl. Eye exam is current.  Back Pain This is a chronic problem. The current episode started more than 1 year ago. The problem occurs intermittently. The pain is present in the lumbar spine. The quality of the pain is described as aching. The pain is at a severity of 3/10. The pain is moderate. Risk factors include obesity. She has tried analgesics for the symptoms. The treatment provided moderate relief.  Hyperlipidemia This is a chronic problem. The current episode started more than 1 year ago. The problem is controlled. Recent lipid tests were reviewed and are normal. Exacerbating diseases include obesity. Pertinent negatives include no shortness of breath. Current antihyperlipidemic treatment includes statins. The current treatment provides moderate improvement of lipids. Risk factors for coronary artery disease include dyslipidemia, hypertension, a sedentary lifestyle and post-menopausal.  Depression        This is a chronic problem.  The current episode started more than 1 year ago.   The problem occurs intermittently.  Associated symptoms include sad.  Associated symptoms include no helplessness and no hopelessness.  Past treatments include SNRIs - Serotonin and norepinephrine reuptake inhibitors. Constipation This is a chronic problem. The current episode started more than 1 year ago. Her stool frequency is 2 to 3 times per week. Associated symptoms include back pain. She has tried laxatives and stool softeners (linzess ) for the symptoms. The treatment provided mild  relief.      Review of Systems  Constitutional:  Positive for malaise/fatigue.  Eyes:  Positive for blurred vision (some times).  Respiratory:  Negative for shortness  of breath.   Gastrointestinal:  Positive for constipation and heartburn.  Musculoskeletal:  Positive for back pain.  All other systems reviewed and are negative.  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 Son lives 1 hour away   Social Drivers of Health   Financial Resource Strain: Low Risk  (10/05/2023)   Overall Financial Resource Strain (CARDIA)    Difficulty of Paying Living Expenses: Not hard at all  Food Insecurity: No Food Insecurity (10/05/2023)   Hunger Vital Sign    Worried About Running Out of Food in the Last Year: Never true    Ran Out of Food in the Last Year: Never true  Transportation Needs: No Transportation Needs (10/05/2023)   PRAPARE - Administrator, Civil Service (Medical): No    Lack of Transportation (Non-Medical): No  Physical Activity: Insufficiently Active (10/05/2023)   Exercise Vital Sign    Days of Exercise per Week: 7 days    Minutes of Exercise per Session: 20 min  Stress: Stress Concern Present (10/05/2023)   [employer] of Occupational  Health - Occupational Stress Questionnaire    Feeling of Stress: To some extent  Social Connections: Moderately Isolated (10/05/2023)   Social Connection and Isolation Panel    Frequency of Communication with Friends and Family: Once a week    Frequency of Social Gatherings with Friends and Family: Once a week    Attends Religious Services: More than 4 times per year    Active Member of [employer] or Organizations: No    Attends Engineer, structural: Not on file    Marital Status: Married       Objective:   Physical Exam Vitals reviewed.  Constitutional:      General: She is not in acute distress.    Appearance: She is well-developed.  HENT:     Head: Normocephalic and atraumatic.     Right Ear: Tympanic membrane normal.     Left Ear: Tympanic membrane normal.  Eyes:     Pupils: Pupils are equal, round, and reactive to light.  Neck:     Thyroid : No thyromegaly.  Cardiovascular:  Rate and Rhythm: Normal rate and regular rhythm.     Heart sounds: Normal heart sounds. No murmur heard. Pulmonary:     Effort: Pulmonary effort is normal. No respiratory distress.     Breath sounds: Normal breath sounds. No wheezing.  Abdominal:     General: Bowel sounds are normal. There is no distension.     Palpations: Abdomen is soft.     Tenderness: There is no abdominal tenderness.  Musculoskeletal:        General: No tenderness.     Cervical back: Normal range of motion and neck supple.     Comments: Pain in lumbar with flexion and extension  Skin:    General: Skin is warm and dry.  Neurological:     Mental Status: She is alert and oriented to person, place, and time.     Cranial Nerves: No cranial nerve deficit.     Deep Tendon Reflexes: Reflexes are normal and symmetric.  Psychiatric:        Behavior: Behavior normal.        Thought Content: Thought content normal.        Judgment: Judgment normal.        BP (!) 149/72   Pulse 63   Temp (!) 97 F (36.1 C) (Temporal)    Ht 5' 2 (1.575 m)   Wt 137 lb 6.4 oz (62.3 kg)   BMI 25.13 kg/m   Assessment & Plan:  LALAINE OVERSTRE[address] comes in today with chief complaint of Medical Management of Chronic Issues   Diagnosis and orders addressed:  1. Chronic gout involving toe without tophus, unspecified cause, unspecified laterality - allopurinol  (ZYLOPRIM ) 100 MG tablet; Take 1 tablet (100 mg total) by mouth daily.  Dispense: 90 tablet; Refill: 2  2. Encounter for immunization (Primary) - Flu vaccine HIGH DOSE PF(Fluzone Trivalent)  3. Moderate episode of recurrent major depressive disorder (HCC)  4. Type 2 diabetes mellitus with stage 3 chronic kidney disease, without long-term current use of insulin, unspecified whether stage 3a or 3b CKD (HCC)  5. Degeneration of intervertebral disc of lumbar region with discogenic back pain  6. Stage 3a chronic kidney disease (HCC)  7. Primary hypertension  8. Aortic atherosclerosis  9. Chronic low back pain, unspecified back pain laterality, unspecified whether sciatica present   10. Gastroesophageal reflux disease, unspecified whether esophagitis present  11. Hyperlipidemia, unspecified hyperlipidemia type   12. Osteopenia, unspecified location  13. Constipation, unspecified constipation type Will increase Linzess  to 145 mcg from 72 mcg - linaclotide  (LINZESS ) 145 MCG CAPS capsule; Take 1 capsule (145 mcg total) by mouth daily before breakfast.  Dispense: 90 capsule; Refill: 4   Labs pending Will continue  Mounjaro  to 2.5 mg because of nausea. Will increase  linzess  72 mcg Continue current medications  Health Maintenance reviewed Diet and exercise encouraged  Follow up plan: 3 months    Bari Learn, FNP

## 2024-01-05 NOTE — Patient Instructions (Signed)

## 2024-01-25 ENCOUNTER — Ambulatory Visit (HOSPITAL_BASED_OUTPATIENT_CLINIC_OR_DEPARTMENT_OTHER)
Admission: RE | Admit: 2024-01-25 | Discharge: 2024-01-25 | Disposition: A | Source: Ambulatory Visit | Attending: Surgery | Admitting: Surgery

## 2024-01-25 ENCOUNTER — Ambulatory Visit (HOSPITAL_COMMUNITY)
Admission: RE | Admit: 2024-01-25 | Discharge: 2024-01-25 | Disposition: A | Discharge status: Home / Self Care | Source: Ambulatory Visit | Attending: Surgery | Admitting: Surgery

## 2024-01-25 ENCOUNTER — Ambulatory Visit: Admitting: Physician Assistant

## 2024-01-25 VITALS — BP 149/79 | HR 64 | Temp 97.7°F | Wt 135.1 lb

## 2024-01-25 DIAGNOSIS — I70223 Atherosclerosis of native arteries of extremities with rest pain, bilateral legs: Secondary | ICD-10-CM | POA: Diagnosis not present

## 2024-01-25 DIAGNOSIS — I739 Peripheral vascular disease, unspecified: Secondary | ICD-10-CM | POA: Insufficient documentation

## 2024-01-25 DIAGNOSIS — I70219 Atherosclerosis of native arteries of extremities with intermittent claudication, unspecified extremity: Secondary | ICD-10-CM | POA: Insufficient documentation

## 2024-01-25 LAB — VAS US ABI WITH/WO TBI
Left ABI: 0.54
Right ABI: 0.81

## 2024-01-25 NOTE — Progress Notes (Signed)
 Office Note     CC:  follow up Requesting Provider:  Lavell Bari LABOR, FNP  HPI: Sonya Cook is a 78 y.o. (1945-06-24) female who presents for routine follow up of PAD. She has history of a right SFA stent that was placed in September of 2023 by Dr. Eliza. Since this placement her prior RLE claudication remain resolved. She has no history of rest pain or tissue loss.   Today she reports overall doing well. She says ever so often when she is in her recliner and going to lay back she will catch a cramp in her right calf.This also happens occasionally when she lays down in bed. It is resolved with massaging her leg or getting up and walking around. She otherwise does not have any pain in her legs on ambulation or rest. No non healing wounds. She remains active. She says she has a puppy she has to walk often. She also still works full time. She tries to make an effort to get up and walk around frequently while at work. She is medically managed on Aspirin , statin and Plavix . She is a Former Games Developer.   Past Medical History:  Diagnosis Date   Anxiety    Chronic kidney disease    Depression    Diabetes mellitus without complication (HCC)    type II    Dyslipidemia    Family history of adverse reaction to anesthesia    daughter - slow to wake up    GERD (gastroesophageal reflux disease)    Hiatal hernia    Hyperlipidemia    Hypertension    Internal hemorrhoids    Osteopenia    PONV (postoperative nausea and vomiting)    Vitamin D  deficiency     Past Surgical History:  Procedure Laterality Date   ABDOMINAL AORTOGRAM W/LOWER EXTREMITY N/A 12/27/2021   Procedure: ABDOMINAL AORTOGRAM W/LOWER EXTREMITY;  Surgeon: Eliza Lonni RAMAN, MD;  Location: [facility] INVASIVE CV LAB;  Service: Cardiovascular;  Laterality: N/A;   ABDOMINAL HYSTERECTOMY     LUMBAR DISC SURGERY     PERIPHERAL VASCULAR INTERVENTION Right 12/27/2021   Procedure: PERIPHERAL VASCULAR INTERVENTION;  Surgeon: Eliza Lonni RAMAN, MD;  Location: [facility] INVASIVE CV LAB;  Service: Cardiovascular;  Laterality: Right;   right hand surgery for cyst      SHOULDER OPEN ROTATOR CUFF REPAIR Right 04/30/2017   Procedure: Right shoulder mini open rotator cuff repair;  Surgeon: Duwayne Purchase, MD;  Location: [facility]S;  Service: Orthopedics;  Laterality: Right;  Interscalene Block    Social History   Socioeconomic History   Marital status: Married    Spouse name: Ozell   Number of children: 2   Years of education: Not on file   Highest education level: 12th grade  Occupational History   Occupation: psychologist, 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  Tobacco Use   Smoking status: Former    Types: Cigarettes    Start date: 04/22/2000   Smokeless tobacco: Never  Vaping Use   Vaping status: Never Used  Substance and Sexual Activity   Alcohol use: Yes    Comment: occsional use   Drug use: No   Sexual activity: Not Currently  Other Topics Concern   Not on file  Social History Narrative   Lives home with husband and great granddaughter. Son lives 1 hour away   Social Drivers of Health   Financial Resource Strain: Low Risk  (10/05/2023)  Overall Financial Resource Strain (CARDIA)    Difficulty of Paying Living Expenses: Not hard at all  Food Insecurity: No Food Insecurity (10/05/2023)   Hunger Vital Sign    Worried About Running Out of Food in the Last Year: Never true    Ran Out of Food in the Last Year: Never true  Transportation Needs: No Transportation Needs (10/05/2023)   PRAPARE - Administrator, Civil Service (Medical): No    Lack of Transportation (Non-Medical): No  Physical Activity: Insufficiently Active (10/05/2023)   Exercise Vital Sign    Days of Exercise per Week: 7 days    Minutes of Exercise per Session: 20 min  Stress: Stress Concern Present (10/05/2023)   [employer] of Occupational Health - Occupational Stress Questionnaire     Feeling of Stress: To some extent  Social Connections: Moderately Isolated (10/05/2023)   Social Connection and Isolation Panel    Frequency of Communication with Friends and Family: Once a week    Frequency of Social Gatherings with Friends and Family: Once a week    Attends Religious Services: More than 4 times per year    Active Member of [employer] or Organizations: No    Attends Banker Meetings: Not on file    Marital Status: Married  Catering Manager Violence: Not At Risk (08/10/2023)   Received from [facility]   HITS    Over the last 12 months how often did your partner physically hurt you?: Never    Over the last 12 months how often did your partner insult you or talk down to you?: Never    Over the last 12 months how often did your partner threaten you with physical harm?: Never    Over the last 12 months how often did your partner scream or curse at you?: Never    Family History  Problem Relation Age of Onset   Hodgkin's lymphoma Mother    Atrial fibrillation Mother    Cancer Father        Lungs   Colon cancer Neg Hx    Diabetes Neg Hx    Stomach cancer Neg Hx    Rectal cancer Neg Hx     Current Outpatient Medications  Medication Sig Dispense Refill   acetaminophen  (TYLENOL ) 650 MG CR tablet Take 650-1,300 mg by mouth every 8 (eight) hours as needed for pain.     allopurinol  (ZYLOPRIM ) 100 MG tablet Take 1 tablet (100 mg total) by mouth daily. 90 tablet 2   aspirin  EC 81 MG tablet Take 81 mg by mouth every evening. Swallow whole.     atorvastatin  (LIPITOR) 40 MG tablet Take 1 tablet (40 mg total) by mouth every evening. 90 tablet 2   BELBUCA 750 MCG FILM Take by mouth 2 (two) times daily.     busPIRone  (BUSPAR ) 5 MG tablet TAKE ONE TABLET 3 TIMES A DAY AS NEEDED. 90 tablet 2   celecoxib (CELEBREX) 200 MG capsule Take by mouth 2 (two) times daily.     Cholecalciferol (VITAMIN D3) 50 MCG (2000 UT) TABS Take 2,000-4,000 Units by mouth See admin instructions.  Take 1 tablet (2000 units) by mouth in the morning & take 2 tablets (4000 units) by mouth at night.     clopidogrel  (PLAVIX ) 75 MG tablet TAKE ONE (1) TABLET BY MOUTH EVERY DAY 90 tablet 0   linaclotide  (LINZESS ) 145 MCG CAPS capsule Take 1 capsule (145 mcg total) by mouth daily before breakfast. 90 capsule 4  lisinopril  (ZESTRIL ) 10 MG tablet Take 1 tablet (10 mg total) by mouth daily. 90 tablet 4   MOUNJARO  2.5 MG/0.5ML Pen INJECT 2.5MG  INTO THE SKIN ONCE A WEEK 2 mL 3   Multiple Vitamin (MULTIVITAMIN) LIQD Take 5 mLs by mouth daily.     NARCAN 4 MG/0.1ML LIQD nasal spray kit Place 1 spray into the nose as needed (accidental overdose).     Omega-3 Fatty Acids (FISH OIL MAXIMUM STRENGTH) 1200 MG CAPS Take 2,400-3,600 mg by mouth See admin instructions. Take 2 capsules (2400 mg) by mouth in the morning & 3 capsules (3600 mg) by mouth in the evening.     Omeprazole -Sodium Bicarbonate  (ZEGERID  OTC) 20-1100 MG CAPS capsule Take 1 capsule by mouth daily before breakfast.     ondansetron  (ZOFRAN ) 4 MG tablet TAKE 1 TABLET EVERY 8 HOURS AS NEEDED FOR NAUSEA AND VOMITING 90 tablet 1   triamcinolone  ointment (KENALOG ) 0.5 % Apply 1 Application topically 2 (two) times daily. 30 g 0   No current facility-administered medications for this visit.    Allergies  Allergen Reactions   Codeine Hives   Ibuprofen Other (See Comments)    Stomach bleed   Ozempic (0.25 Or 0.5 Mg-Dose) [Semaglutide(0.25 Or 0.5mg -Dos)] Nausea Only   Penicillins Hives    Has patient had a PCN reaction causing immediate rash, facial/tongue/throat swelling, SOB or lightheadedness with hypotension: Yes Has patient had a PCN reaction causing severe rash involving mucus membranes or skin necrosis: No Has patient had a PCN reaction that required hospitalization: No Has patient had a PCN reaction occurring within the last 10 years: No If all of the above answers are NO, then may proceed with Cephalosporin use.    Jardiance   [Empagliflozin ]     Felt bad, couldn't get out of bed     REVIEW OF SYSTEMS:  Negative unless noted in HPI [X]  denotes positive finding, [ ]  denotes negative finding Cardiac  Comments:  Chest pain or chest pressure:    Shortness of breath upon exertion:    Short of breath when lying flat:    Irregular heart rhythm:        Vascular    Pain in calf, thigh, or hip brought on by ambulation:    Pain in feet at night that wakes you up from your sleep:     Blood clot in your veins:    Leg swelling:         Pulmonary    Oxygen at home:    Productive cough:     Wheezing:         Neurologic    Sudden weakness in arms or legs:     Sudden numbness in arms or legs:     Sudden onset of difficulty speaking or slurred speech:    Temporary loss of vision in one eye:     Problems with dizziness:         Gastrointestinal    Blood in stool:     Vomited blood:         Genitourinary    Burning when urinating:     Blood in urine:        Psychiatric    Major depression:         Hematologic    Bleeding problems:    Problems with blood clotting too easily:        Skin    Rashes or ulcers:        Constitutional    Fever or  chills:      PHYSICAL EXAMINATION:  Vitals:   01/25/24 1223  BP: (!) 149/79  Pulse: 64  Temp: 97.7 F (36.5 C)    General:  WDWN in NAD; vital signs documented above Gait: Normal HENT: WNL, normocephalic Pulmonary: normal non-labored breathing Cardiac: regular HR Abdomen: soft Vascular Exam/Pulses: 2+ femoral, 2+ DP pulse bilaterally. Feet warm and well perfused Extremities: without ischemic changes, without Gangrene , without cellulitis; without open wounds;  Musculoskeletal: no muscle wasting or atrophy   Neurologic: A&O X 3 Psychiatric:  The pt has Normal affect.   Non-Invasive Vascular Imaging:   +-------+-----------+-----------+------------+------------+  ABI/TBIToday's ABIToday's TBIPrevious ABIPrevious TBI   +-------+-----------+-----------+------------+------------+  Right 0.81       0.48       1.02        0.71          +-------+-----------+-----------+------------+------------+  Left  0.54       0.32       0.59        0.28          +-------+-----------+-----------+------------+------------+   VAS US  Lower Extremity Arterial Duplex right: Summary:  Right: Patent superficial femoral artery stent with elevated velocities in the mid segment consistent with 50-99% restenosis (PSV 268 cm/s).  No significant change compared to previous exam (PSV 288 cm/s).     ASSESSMENT/PLAN:: 78 y.o. female here for follow up for PAD. She has history of a right SFA stent that was placed in September of 2023 by Dr. Eliza. Since this placement her prior RLE claudication remain resolved. She has no claudication, rest pain or tissue loss. She does get occasional cramping in her right leg in a recumbent position. This is very infrequent. - ABI on RLE has decreased compared to prior exam in April. LLE stable - Duplex continues to show elevated velocity in the SFA stent but is essentially unchanged from prior  - Continue Aspirin , Statin, Plavix  - Continue dedicated walking regimen - follow up in 6 months with repeat RLE arterial duplex and ABI   Teretha Damme, PA-C [facility] [phone]  Clinic MD:   [place]

## 2024-01-26 ENCOUNTER — Other Ambulatory Visit: Payer: Self-pay

## 2024-01-26 DIAGNOSIS — I70219 Atherosclerosis of native arteries of extremities with intermittent claudication, unspecified extremity: Secondary | ICD-10-CM

## 2024-02-04 ENCOUNTER — Ambulatory Visit (INDEPENDENT_AMBULATORY_CARE_PROVIDER_SITE_OTHER): Payer: Self-pay

## 2024-02-04 ENCOUNTER — Other Ambulatory Visit: Payer: Self-pay | Admitting: Family

## 2024-02-04 VITALS — BP 149/79 | HR 64 | Ht 62.0 in | Wt 135.0 lb

## 2024-02-04 DIAGNOSIS — Z0000 Encounter for general adult medical examination without abnormal findings: Secondary | ICD-10-CM

## 2024-02-04 DIAGNOSIS — E785 Hyperlipidemia, unspecified: Secondary | ICD-10-CM

## 2024-02-04 NOTE — Progress Notes (Signed)
 Subjective:   Sonya Cook is a 78 y.o. female who presents for a Medicare Annual Wellness Visit.  I connected with  Rock FORBES Rinks on 02/04/24 by a audio enabled telemedicine application and verified that I am speaking with the correct person using two identifiers.  Patient Location: Home  Provider Location: Home Office  I discussed the limitations of evaluation and management by telemedicine. The patient expressed understanding and agreed to proceed.   Allergies (verified) Codeine, Ibuprofen, Ozempic (0.25 or 0.5 mg-dose) [semaglutide(0.25 or 0.5mg -dos)], Penicillins, and Jardiance  [empagliflozin ]   History: Past Medical History:  Diagnosis Date   Allergy years ago   penicillin,codine,Ibuprofen   Anxiety    Cataract 2008 maybe   had surgery   Chronic kidney disease    Depression    Diabetes mellitus without complication (HCC)    type II    Dyslipidemia    Family history of adverse reaction to anesthesia    daughter - slow to wake up    GERD (gastroesophageal reflux disease)    Hiatal hernia    Hyperlipidemia    Hypertension    Internal hemorrhoids    Osteopenia    PONV (postoperative nausea and vomiting)    Vitamin D  deficiency    Past Surgical History:  Procedure Laterality Date   ABDOMINAL AORTOGRAM W/LOWER EXTREMITY N/A 12/27/2021   Procedure: ABDOMINAL AORTOGRAM W/LOWER EXTREMITY;  Surgeon: Eliza Lonni RAMAN, MD;  Location: [facility] INVASIVE CV LAB;  Service: Cardiovascular;  Laterality: N/A;   ABDOMINAL HYSTERECTOMY     LUMBAR DISC SURGERY     PERIPHERAL VASCULAR INTERVENTION Right 12/27/2021   Procedure: PERIPHERAL VASCULAR INTERVENTION;  Surgeon: Eliza Lonni RAMAN, MD;  Location: [facility] INVASIVE CV LAB;  Service: Cardiovascular;  Laterality: Right;   right hand surgery for cyst      SHOULDER OPEN ROTATOR CUFF REPAIR Right 04/30/2017   Procedure: Right shoulder mini open rotator cuff repair;  Surgeon: Duwayne Purchase, MD;  Location: [facility];  Service:  Orthopedics;  Laterality: Right;  Interscalene Block   Family History  Problem Relation Age of Onset   Hodgkin's lymphoma Mother    Atrial fibrillation Mother    Cancer Mother    Depression Mother    Cancer Father        Lungs   Heart disease Maternal Grandfather    Heart disease Maternal Grandmother    Heart disease Paternal Grandfather    Heart disease Paternal Grandmother    Cancer Daughter    Colon cancer Neg Hx    Diabetes Neg Hx    Stomach cancer Neg Hx    Rectal cancer Neg Hx    Social History   Occupational History   Occupation: psychologist, 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  Tobacco Use   Smoking status: Former    Current packs/day: 0.00    Average packs/day: 1.5 packs/day for 20.0 years (30.0 ttl pk-yrs)    Types: Cigarettes    Start date: 04/22/2000    Quit date: 08/29/2008    Years since quitting: 15.4   Smokeless tobacco: Never  Vaping Use   Vaping status: Never Used  Substance and Sexual Activity   Alcohol use: Yes    Alcohol/week: 2.0 standard drinks of alcohol    Types: 2 Glasses of wine per week    Comment: NOT EVERY WEEK, BUT IF I WANT TO I DO...SMALL WITH FOOD   Drug use: No   Sexual  activity: Not Currently   Tobacco Counseling Counseling given: Yes  SDOH Screenings   Food Insecurity: No Food Insecurity (02/04/2024)  Housing: Low Risk  (02/04/2024)  Transportation Needs: No Transportation Needs (02/04/2024)  Utilities: Not At Risk (02/04/2024)  Alcohol Screen: Low Risk  (10/05/2023)  Depression (PHQ2-9): Medium Risk (02/04/2024)  Financial Resource Strain: Low Risk  (01/29/2024)  Physical Activity: Inactive (02/04/2024)  Social Connections: Moderately Isolated (02/04/2024)  Stress: Stress Concern Present (02/04/2024)  Tobacco Use: Medium Risk (02/04/2024)  Health Literacy: Adequate Health Literacy (02/04/2024)   Depression Screen    02/04/2024   12:27 PM 01/05/2024    2:40 PM 10/05/2023    3:40 PM  02/10/2023   11:21 AM 11/21/2022    9:40 AM 08/05/2022   11:25 AM 03/03/2022   11:25 AM  PHQ 2/9 Scores  PHQ - 2 Score 1 1 1 1  0 2 0  PHQ- 9 Score 5 5  5  5   7        Data saved with a previous flowsheet row definition      Goals Addressed             This Visit's Progress    Patient Stated   On track    Hopes her legs get better so she can walk some every day and feel better       Visit info / Clinical Intake: Medicare Wellness Visit Type:: Subsequent Annual Wellness Visit Medicare Wellness Visit Mode:: Telephone If telephone:: video declined If telephone or video:: vitals recorded from last visit Interpreter Needed?: No Pre-visit prep was completed: yes AWV questionnaire completed by patient prior to visit?: yes Date:: 01/29/24 Living arrangements:: lives with spouse/significant other Patient's Overall Health Status Rating: very good Typical amount of pain: none Does pain affect daily life?: no Are you currently prescribed opioids?: no  Dietary Habits and Nutritional Risks How many meals a day?: 3 Eats fruit and vegetables daily?: yes Most meals are obtained by: preparing own meals Diabetic:: no  Functional Status Activities of Daily Living (to include ambulation/medication): (Patient-Rptd) Independent Ambulation: (Patient-Rptd) Independent Medication Administration: Independent Home Management: (Patient-Rptd) Independent Manage your own finances?: yes Primary transportation is: driving Concerns about hearing?: no  Fall Screening Falls in the past year?: (Patient-Rptd) 1 Number of falls in past year: (Patient-Rptd) 0 Was there an injury with Fall?: (Patient-Rptd) 0 Fall Risk Category Calculator: (Patient-Rptd) 1 Patient Fall Risk Level: (Patient-Rptd) Low Fall Risk  Fall Risk Patient at Risk for Falls Due to: No Fall Risks Fall risk Follow up: Falls evaluation completed; Education provided  Home and Transportation Safety: All rugs have non-skid  backing?: yes All stairs or steps have railings?: N/A, no stairs Grab bars in the bathtub or shower?: yes Have non-skid surface in bathtub or shower?: yes Good home lighting?: yes Regular seat belt use?: yes Hospital stays in the last year:: no  Cognitive Assessment Difficulty concentrating, remembering, or making decisions? : no Will 6CIT or Mini Cog be Completed: yes What year is it?: 0 points What month is it?: 0 points Give patient an address phrase to remember (5 components): [address] [place], [place] About what time is it?: 0 points Count backwards from 20 to 1: 0 points Say the months of the year in reverse: 0 points Repeat the address phrase from earlier: 0 points 6 CIT Score: 0 points  Advance Directives (For Healthcare) Does Patient Have a Medical Advance Directive?: No Would patient like information on creating a medical advance directive?: -- (  pt aware to pick up info at pcp's office)  Reviewed/Updated  Reviewed/Updated: All; Medical History; Surgical History; Family History; Medications; Allergies; Care Teams; Patient Goals        Objective:    Today's Vitals   02/04/24 1549  BP: (!) 149/79  Pulse: 64  Weight: 135 lb (61.2 kg)  Height: 5' 2 (1.575 m)   Body mass index is 24.69 kg/m.  Current Medications (verified) Outpatient Encounter Medications as of 02/04/2024  Medication Sig   acetaminophen  (TYLENOL ) 650 MG CR tablet Take 650-1,300 mg by mouth every 8 (eight) hours as needed for pain.   allopurinol  (ZYLOPRIM ) 100 MG tablet Take 1 tablet (100 mg total) by mouth daily.   aspirin  EC 81 MG tablet Take 81 mg by mouth every evening. Swallow whole.   atorvastatin  (LIPITOR) 40 MG tablet Take 1 tablet (40 mg total) by mouth every evening.   BELBUCA 750 MCG FILM Take by mouth 2 (two) times daily.   busPIRone  (BUSPAR ) 5 MG tablet TAKE ONE TABLET 3 TIMES A DAY AS NEEDED.   celecoxib (CELEBREX) 200 MG capsule Take by mouth 2 (two) times daily.   Cholecalciferol  (VITAMIN D3) 50 MCG (2000 UT) TABS Take 2,000-4,000 Units by mouth See admin instructions. Take 1 tablet (2000 units) by mouth in the morning & take 2 tablets (4000 units) by mouth at night.   clopidogrel  (PLAVIX ) 75 MG tablet TAKE ONE (1) TABLET BY MOUTH EVERY DAY   linaclotide  (LINZESS ) 145 MCG CAPS capsule Take 1 capsule (145 mcg total) by mouth daily before breakfast.   lisinopril  (ZESTRIL ) 10 MG tablet Take 1 tablet (10 mg total) by mouth daily.   MOUNJARO  2.5 MG/0.5ML Pen INJECT 2.5MG  INTO THE SKIN ONCE A WEEK   Multiple Vitamin (MULTIVITAMIN) LIQD Take 5 mLs by mouth daily.   NARCAN 4 MG/0.1ML LIQD nasal spray kit Place 1 spray into the nose as needed (accidental overdose).   Omega-3 Fatty Acids (FISH OIL MAXIMUM STRENGTH) 1200 MG CAPS Take 2,400-3,600 mg by mouth See admin instructions. Take 2 capsules (2400 mg) by mouth in the morning & 3 capsules (3600 mg) by mouth in the evening.   Omeprazole -Sodium Bicarbonate  (ZEGERID  OTC) 20-1100 MG CAPS capsule Take 1 capsule by mouth daily before breakfast.   ondansetron  (ZOFRAN ) 4 MG tablet TAKE 1 TABLET EVERY 8 HOURS AS NEEDED FOR NAUSEA AND VOMITING   triamcinolone  ointment (KENALOG ) 0.5 % Apply 1 Application topically 2 (two) times daily.   No facility-administered encounter medications on file as of 02/04/2024.   Hearing/Vision screen Hearing Screening - Comments:: Pt have some hearing dif Vision Screening - Comments:: Pt use reading glasses/pt Dr. Jama in [place] in [place]/ last ov 2025 Immunizations and Health Maintenance Health Maintenance  Topic Date Due   COVID-19 Vaccine (3 - Moderna risk series) 10/17/2019   OPHTHALMOLOGY EXAM  12/05/2023   DEXA SCAN  02/05/2024   HEMOGLOBIN A1C  04/06/2024   Diabetic kidney evaluation - eGFR measurement  10/04/2024   Diabetic kidney evaluation - Urine ACR  10/04/2024   FOOT EXAM  10/04/2024   Medicare Annual Wellness (AWV)  02/03/2025   DTaP/Tdap/Td (2 - Td or Tdap) 09/04/2028    Pneumococcal Vaccine: 50+ Years  Completed   Influenza Vaccine  Completed   Hepatitis C Screening  Completed   Zoster Vaccines- Shingrix   Completed   Meningococcal B Vaccine  Aged Out   Mammogram  Discontinued   Colonoscopy  Discontinued        Assessment/Plan:  This is a routine wellness examination for Sonya Cook.  Patient Care Team: Lavell Bari LABOR, FNP as PCP - General (Family Medicine) Billee Mliss BIRCH, [facility] (Pharmacist) Dannial Hacker, MD as Referring Physician (Pain Medicine) Ladora Ross Lacy Phebe, MD as Referring Physician (Optometry) Duwayne Purchase, MD as Consulting Physician (Orthopedic Surgery) Sater, Charlie LABOR, MD (Neurology)  I have personally reviewed and noted the following in the patient's chart:   Medical and social history Use of alcohol, tobacco or illicit drugs  Current medications and supplements including opioid prescriptions. Functional ability and status Nutritional status Physical activity Advanced directives List of other physicians Hospitalizations, surgeries, and ER visits in previous 12 months Vitals Screenings to include cognitive, depression, and falls Referrals and appointments  No orders of the defined types were placed in this encounter.  In addition, I have reviewed and discussed with patient certain preventive protocols, quality metrics, and best practice recommendations. A written personalized care plan for preventive services as well as general preventive health recommendations were provided to patient.   Sonya Cook, CMA   02/04/2024   Return in 1 year (on 02/03/2025).  After Visit Summary: (MyChart) Due to this being a telephonic visit, the after visit summary with patients personalized plan was offered to patient via MyChart   Nurse Notes: Diabetic Eye exam send to care team request diabetic eye exam

## 2024-02-08 ENCOUNTER — Telehealth: Payer: Self-pay

## 2024-02-08 NOTE — Progress Notes (Addendum)
 Pharmacy Quality Measure Review  This patient is appearing on a report for being at risk of failing the adherence measure for diabetes medications this calendar year.   Medication: Mounjaro  2.5 mg weekly Last fill date: 01/04/2024 for 28 day supply  Attempted to contact patient in regard to medication management. She has a history of nausea and vomiting on GLP1RAs, wanted to ensure that she is tolerating her current dose. Left a HIPAA-compliant voicemail with direct call back number.   Verified fill record with pharmacy. Patient has yet to call in a refill of her prescription. There are refills remaining on the prescription.   Medication: atorvastatin  40 mg daily Last fill date: 02/04/2024 for 90 day supply  Insurance report not up to date, no further action needed for statin management at this time.   Woodie Jock, PharmD PGY1 Pharmacy Resident  02/08/2024

## 2024-02-10 ENCOUNTER — Encounter

## 2024-02-10 DIAGNOSIS — K5900 Constipation, unspecified: Secondary | ICD-10-CM

## 2024-02-11 MED ORDER — LINACLOTIDE 290 MCG PO CAPS: 290.0000 ug | ORAL_CAPSULE | Freq: Every day | ORAL | 3 refills | Status: AC

## 2024-02-11 NOTE — Telephone Encounter (Signed)
 Hello, I have increased your Linzess  to 290 mcg. Let me know if you need anything else!   Bari Learn, FNP  Approximately 5 minutes was spent documenting and reviewing patient's chart.

## 2024-03-09 ENCOUNTER — Other Ambulatory Visit: Payer: Self-pay | Admitting: Family

## 2024-03-09 DIAGNOSIS — H60543 Acute eczematoid otitis externa, bilateral: Secondary | ICD-10-CM

## 2024-03-11 ENCOUNTER — Telehealth: Payer: Self-pay | Admitting: Pharmacist

## 2024-03-11 NOTE — Telephone Encounter (Signed)
° °  Patient continues on Mounjaro  2.5mg  sq weekly and is stable from a T2DM standpoint with A1c of 5.5% on 10/05/23.  Will address blood pressure at follow up visit 140-150s SBP at last visit.  She is also on 2 other brand name medications that are adding to her costly prescription bills.  She is requesting assistance for Linzess , Mounjaro , & Belbuca.  We will aim to apply for Linzess  patient assistance through [employer].  Patient has provided financial information to completed application.   Sonya Cook, PharmD, BCACP, CPP Clinical Pharmacist, [facility]

## 2024-03-15 ENCOUNTER — Telehealth: Payer: Self-pay

## 2024-03-15 NOTE — Telephone Encounter (Signed)
 Patient provided financial info and letter requesting PAP for Linzess  (with [employer]).  Patient also due for re-enrollment with [employer] for Farxiga patient assistance. Ending 03/30/24.

## 2024-03-21 ENCOUNTER — Telehealth: Payer: Self-pay | Admitting: Pharmacist

## 2024-03-21 DIAGNOSIS — N183 Chronic kidney disease, stage 3 unspecified: Secondary | ICD-10-CM

## 2024-03-21 MED ORDER — MOUNJARO 2.5 MG/0.5ML ~~LOC~~ SOAJ: 2.5000 mg | SUBCUTANEOUS | 3 refills | Status: DC

## 2024-03-21 NOTE — Telephone Encounter (Signed)
" ° °  This patient is appearing on a report for being at risk of failing the adherence measure for diabetes medications this calendar year.   Medication: Mounjaro  2.5mg  Last fill date: 11/17 for 28 day supply  Left voicemail for patient to return my call at their convenience., MyChart message sent to patient., and Will collaborate with provider to facilitate refill needs.   Tiberius Loftus Dattero Ladiamond Gallina, PharmD, BCACP, CPP Clinical Pharmacist, [facility]  "

## 2024-03-30 ENCOUNTER — Other Ambulatory Visit (HOSPITAL_COMMUNITY): Payer: Self-pay

## 2024-03-30 NOTE — Telephone Encounter (Signed)
 In process of mailing both applications to patients home

## 2024-04-05 ENCOUNTER — Ambulatory Visit: Payer: Self-pay

## 2024-04-05 NOTE — Telephone Encounter (Signed)
 PAP: Patient assistance application for Farxiga through [employer] ([employer]) has been mailed to pt's home address on file.    PAP: Patient assistance application for LINZESS  through [employer]) has been mailed to pt's home address on file.

## 2024-04-05 NOTE — Telephone Encounter (Signed)
 FYI Only or Action Required?: FYI only for provider: appointment scheduled on 1/8 as earliest pt would accept, refusing UC in meantime.  Patient was last seen in primary care on 01/05/2024 by Lavell Bari LABOR, FNP.  Called Nurse Triage reporting Ear Pain.  Symptoms began several weeks ago.  Interventions attempted: OTC medications: mucinex, Rest, hydration, or home remedies, and Other: attempted UC x3.  Symptoms are: unchanged.  Triage Disposition: See HCP Within 4 Hours (Or PCP Triage)  Patient/caregiver understands and will follow disposition?: No, wishes to speak with PCP    Copied from CRM #[id number]. Topic: Clinical - Red Word Triage >> Apr 05, 2024  2:56 PM Avram MATSU wrote: Red Word that prompted transfer to Nurse Triage: congestions that wont go away, discolored greenish/yellowish mucus, sometimes ear hurts, sometimes nausea. Weakness/low energy. Tried to go to UC x3 but not examined each time due to wait during holidays Reason for Disposition  [1] MILD difficulty breathing (e.g., minimal/no SOB at rest, SOB with walking, pulse < 100) AND [2] NEW-onset or WORSE than normal  Answer Assessment - Initial Assessment Questions This RN recommended pt be examined in next 4 hours, scheduled for 1/8 as earliest available appt with offices, pt attempted UC 3x in past couple weeks would not see me, refusing UC. Advised go to ED if new or worsening symptoms.    Not too weak to stand Any weakness think because been laying around more No SOB as long as keep mucus cleared out, been taking mucinex DM, really helped to get things cleared out but now not really helping Nobody would see me at UC x3 last week Something to take to get over this hump No fever Coughing up yellow/green Coughing a lot to get things out, gets me for a moment, SOB not for long Once get some up feel better Can't lay flat because collects in my throat, even if lay on side, just get up, trouble breathing laying  breathe, stops nose up and don't feel like get enough air laying down 2/10 at the most, last weekend would have told you 10/10, subsided, now just feel stopped up, no popping, no relief from it No abnormal breathing sounds per pt 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

## 2024-04-07 ENCOUNTER — Ambulatory Visit (INDEPENDENT_AMBULATORY_CARE_PROVIDER_SITE_OTHER): Admitting: Family

## 2024-04-07 ENCOUNTER — Encounter: Payer: Self-pay | Admitting: Family

## 2024-04-07 VITALS — BP 145/76 | HR 65 | Temp 96.9°F | Ht 62.0 in | Wt 136.0 lb

## 2024-04-07 DIAGNOSIS — B9689 Other specified bacterial agents as the cause of diseases classified elsewhere: Secondary | ICD-10-CM | POA: Diagnosis not present

## 2024-04-07 DIAGNOSIS — J208 Acute bronchitis due to other specified organisms: Secondary | ICD-10-CM

## 2024-04-07 MED ORDER — BENZONATATE 200 MG PO CAPS: 200.0000 mg | ORAL_CAPSULE | Freq: Two times a day (BID) | ORAL | 0 refills | Status: AC | PRN

## 2024-04-07 MED ORDER — DOXYCYCLINE MONOHYDRATE 100 MG PO TABS: 100.0000 mg | ORAL_TABLET | Freq: Two times a day (BID) | ORAL | 0 refills | Status: AC

## 2024-04-07 NOTE — Progress Notes (Signed)
 "  Subjective:    Patient ID: Sonya Cook, female    DOB: [birth year], 79 y.o.   MRN: [id number]  Chief Complaint  Patient presents with   Cough    PRODUCTIVE BEEN GOING ON FOR WEEKS, STILL FATIGUED. PATIENT STATES SHE CAN NOT TAKE STEROIDS. SHE WANTS SOMETHING TO HELP HER GET OVER THE HUMP.    Pt presents to the office today with cough that started two weeks ago. States she started off with URI symptoms with fever, but fever improved but has continued to have a productive cough.  Cough This is a new problem. The current episode started 1 to 4 weeks ago. The problem has been gradually worsening. The problem occurs every few minutes. The cough is Productive of purulent sputum. Associated symptoms include ear congestion, ear pain, headaches, myalgias, nasal congestion, postnasal drip and a sore throat. Pertinent negatives include no chills (resolved now), fever (resolved now), shortness of breath or wheezing. She has tried rest and OTC cough suppressant for the symptoms. The treatment provided mild relief.      Review of Systems  Constitutional:  Negative for chills (resolved now) and fever (resolved now).  HENT:  Positive for ear pain, postnasal drip and sore throat.   Respiratory:  Positive for cough. Negative for shortness of breath and wheezing.   Musculoskeletal:  Positive for myalgias.  Neurological:  Positive for headaches.  All other systems reviewed and are negative.   Social History   Socioeconomic History   Marital status: Married    Spouse name: Ozell   Number of children: 2   Years of education: Not on file   Highest education level: 12th grade  Occupational History   Occupation: psychologist, 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  Tobacco Use   Smoking status: Former    Current packs/day: 0.00    Average packs/day: 1.5 packs/day for 20.0 years (30.0 ttl pk-yrs)    Types: Cigarettes    Start date: 04/22/2000    Quit  date: 08/29/2008    Years since quitting: 15.6   Smokeless tobacco: Never  Vaping Use   Vaping status: Never Used  Substance and Sexual Activity   Alcohol use: Yes    Alcohol/week: 2.0 standard drinks of alcohol    Types: 2 Glasses of wine per week    Comment: NOT EVERY WEEK, BUT IF I WANT TO I DO...SMALL WITH FOOD   Drug use: No   Sexual activity: Not Currently  Other Topics Concern   Not on file  Social History Narrative   Lives home with husband and great granddaughter. Son lives 1 hour away   Social Drivers of Health   Tobacco Use: Medium Risk (04/07/2024)   Patient History    Smoking Tobacco Use: Former    Smokeless Tobacco Use: Never    Passive Exposure: Not on file  Financial Resource Strain: Low Risk (01/29/2024)   Overall Financial Resource Strain (CARDIA)    Difficulty of Paying Living Expenses: Not very hard  Food Insecurity: No Food Insecurity (02/04/2024)   Epic    Worried About Programme Researcher, Broadcasting/film/video in the Last Year: Never true    Ran Out of Food in the Last Year: Never true  Transportation Needs: No Transportation Needs (02/04/2024)   Epic    Lack of Transportation (Medical): No    Lack of Transportation (Non-Medical): No  Physical Activity: Inactive (02/04/2024)   Exercise Vital Sign  Days of Exercise per Week: 0 days    Minutes of Exercise per Session: 0 min  Stress: Stress Concern Present (02/04/2024)   [employer] of Occupational Health - Occupational Stress Questionnaire    Feeling of Stress: To some extent  Social Connections: Moderately Isolated (02/04/2024)   Social Connection and Isolation Panel    Frequency of Communication with Friends and Family: Three times a week    Frequency of Social Gatherings with Friends and Family: Once a week    Attends Religious Services: Patient declined    Active Member of Clubs or Organizations: No    Attends Banker Meetings: Never    Marital Status: Married  Depression (PHQ2-9): Medium Risk  (04/07/2024)   Depression (PHQ2-9)    PHQ-2 Score: 8  Alcohol Screen: Low Risk (10/05/2023)   Alcohol Screen    Last Alcohol Screening Score (AUDIT): 1  Housing: Low Risk (02/04/2024)   Epic    Unable to Pay for Housing in the Last Year: No    Number of Times Moved in the Last Year: 0    Homeless in the Last Year: No  Utilities: Not At Risk (02/04/2024)   Epic    Threatened with loss of utilities: No  Health Literacy: Adequate Health Literacy (02/04/2024)   B1300 Health Literacy    Frequency of need for help with medical instructions: Never   Family History  Problem Relation Age of Onset   Hodgkin's lymphoma Mother    Atrial fibrillation Mother    Cancer Mother    Depression Mother    Cancer Father        Lungs   Heart disease Maternal Grandfather    Heart disease Maternal Grandmother    Heart disease Paternal Grandfather    Heart disease Paternal Grandmother    Cancer Daughter    Colon cancer Neg Hx    Diabetes Neg Hx    Stomach cancer Neg Hx    Rectal cancer Neg Hx         Objective:   Physical Exam Vitals reviewed.  Constitutional:      General: She is not in acute distress.    Appearance: She is well-developed.  HENT:     Head: Normocephalic and atraumatic.     Right Ear: Tympanic membrane normal.     Left Ear: Tympanic membrane normal.     Mouth/Throat:     Comments: Erythemas  Eyes:     Pupils: Pupils are equal, round, and reactive to light.  Neck:     Thyroid : No thyromegaly.  Cardiovascular:     Rate and Rhythm: Normal rate and regular rhythm.     Heart sounds: Normal heart sounds. No murmur heard. Pulmonary:     Effort: Pulmonary effort is normal. No respiratory distress.     Breath sounds: Normal breath sounds. No wheezing.     Comments: Coarse nonproductive cough Abdominal:     General: Bowel sounds are normal. There is no distension.     Palpations: Abdomen is soft.     Tenderness: There is no abdominal tenderness.  Musculoskeletal:         General: No tenderness. Normal range of motion.     Cervical back: Normal range of motion and neck supple.  Skin:    General: Skin is warm and dry.  Neurological:     Mental Status: She is alert and oriented to person, place, and time.     Cranial Nerves: No cranial nerve deficit.  Deep Tendon Reflexes: Reflexes are normal and symmetric.  Psychiatric:        Behavior: Behavior normal.        Thought Content: Thought content normal.        Judgment: Judgment normal.    BP (!) 168/72   Pulse 65   Temp (!) 96.9 F (36.1 C) (Temporal)   Ht 5' 2 (1.575 m)   Wt 136 lb (61.7 kg)   SpO2 97%   BMI 24.87 kg/m      Assessment & Plan:  Sonya Cook comes in today with chief complaint of Cough (PRODUCTIVE BEEN GOING ON FOR WEEKS, STILL FATIGUED. PATIENT STATES SHE CAN NOT TAKE STEROIDS. SHE WANTS SOMETHING TO HELP HER GET OVER THE HUMP. )   Diagnosis and orders addressed:  1. Acute bacterial bronchitis (Primary) - Start doxycyline  - Use a cool mist humidifier  -Use saline nose sprays frequently -Force fluids -For any cough or congestion  Use plain Mucinex- regular strength or max strength is fine -For fever or aces or pains- take tylenol  or ibuprofen. -Throat lozenges if help -Follow up if symptoms worsen or do not improve  - doxycycline  (ADOXA) 100 MG tablet; Take 1 tablet (100 mg total) by mouth 2 (two) times daily.  Dispense: 20 tablet; Refill: 0 - benzonatate  (TESSALON ) 200 MG capsule; Take 1 capsule (200 mg total) by mouth 2 (two) times daily as needed for cough.  Dispense: 20 capsule; Refill: 0     Bari Learn, FNP   "

## 2024-04-07 NOTE — Patient Instructions (Signed)

## 2024-04-11 NOTE — Telephone Encounter (Signed)
 Patient called to confirm she doesn't take Farxiga medication, only Linzess . Informed patient she could shred papework.

## 2024-04-20 ENCOUNTER — Telehealth: Payer: Self-pay | Admitting: Pharmacist

## 2024-04-20 DIAGNOSIS — E1169 Type 2 diabetes mellitus with other specified complication: Secondary | ICD-10-CM

## 2024-04-20 DIAGNOSIS — Z7985 Long-term (current) use of injectable non-insulin antidiabetic drugs: Secondary | ICD-10-CM

## 2024-04-20 NOTE — Telephone Encounter (Signed)
 Please enroll patient in the [employer] PAP for Trulicity  0.75mg  weekly.  Please route PAP to me for completion.

## 2024-04-21 ENCOUNTER — Telehealth: Payer: Self-pay

## 2024-04-21 NOTE — Telephone Encounter (Signed)
 NEW PAP:   Patient assistance application for Trulicity  through [employer] has been emailed to [employer]

## 2024-05-02 ENCOUNTER — Ambulatory Visit: Admitting: Family

## 2024-05-03 NOTE — Telephone Encounter (Signed)
 Rec'd completed patient pages of [employer] app for Linzess .   Emailed provider page to [employer]

## 2024-05-04 ENCOUNTER — Telehealth: Admitting: Pharmacist

## 2024-05-04 DIAGNOSIS — Z7985 Long-term (current) use of injectable non-insulin antidiabetic drugs: Secondary | ICD-10-CM

## 2024-05-04 DIAGNOSIS — E1169 Type 2 diabetes mellitus with other specified complication: Secondary | ICD-10-CM

## 2024-05-04 MED ORDER — TRULICITY 0.75 MG/0.5ML ~~LOC~~ SOAJ: 0.7500 mg | SUBCUTANEOUS | 4 refills | Status: AC

## 2024-05-04 NOTE — Telephone Encounter (Signed)
" ° °  Patient enrolled in the [place] Cares patient assistance program for Trulicity .  Updated RX escribed to [employer] order (pharmacy for [employer] patient assistance).  Patient will transition from Mounjaro  to Trulicity  due to cost.  Mliss Tarry Griffin, PharmD, BCACP, CPP Clinical Pharmacist, [facility]  "

## 2024-05-06 NOTE — Telephone Encounter (Signed)
 PAP: Patient assistance application for Trulicity  has been approved by PAP Companies: [employer] from 05/04/24 to 03/30/25.   Medication should be delivered to PAP Delivery: Home.   For further shipping updates, please contact [employer] at [phone].   Patient ID is: [id number].

## 2024-05-09 ENCOUNTER — Ambulatory Visit: Payer: Self-pay | Admitting: Family

## 2024-05-09 ENCOUNTER — Other Ambulatory Visit: Payer: Self-pay

## 2024-07-25 ENCOUNTER — Ambulatory Visit (HOSPITAL_COMMUNITY)

## 2024-07-25 ENCOUNTER — Ambulatory Visit

## 2025-02-07 ENCOUNTER — Ambulatory Visit
